# Patient Record
Sex: Female | Born: 1955 | Race: White | Hispanic: No | Marital: Single | Smoking: Former smoker
Health system: Southern US, Community
[De-identification: ages and names within clinical notes are randomized; demographics above are authoritative.]

## PROBLEM LIST (undated history)

## (undated) DIAGNOSIS — Z9889 Other specified postprocedural states: Secondary | ICD-10-CM

## (undated) DIAGNOSIS — J449 Chronic obstructive pulmonary disease, unspecified: Secondary | ICD-10-CM

## (undated) DIAGNOSIS — R112 Nausea with vomiting, unspecified: Secondary | ICD-10-CM

## (undated) DIAGNOSIS — E785 Hyperlipidemia, unspecified: Secondary | ICD-10-CM

## (undated) HISTORY — PX: TUBAL LIGATION: SHX77

## (undated) HISTORY — PX: COLONOSCOPY: SHX174

## (undated) HISTORY — PX: APPENDECTOMY: SHX54

---

## 2008-05-07 ENCOUNTER — Other Ambulatory Visit: Admission: RE | Admit: 2008-05-07 | Discharge: 2008-05-07 | Payer: Self-pay | Admitting: Obstetrics and Gynecology

## 2014-09-14 ENCOUNTER — Other Ambulatory Visit: Payer: Self-pay | Admitting: Physician Assistant

## 2014-09-14 DIAGNOSIS — R82998 Other abnormal findings in urine: Secondary | ICD-10-CM

## 2014-09-17 ENCOUNTER — Other Ambulatory Visit: Payer: Self-pay

## 2014-09-20 ENCOUNTER — Other Ambulatory Visit: Payer: Self-pay

## 2015-01-18 ENCOUNTER — Ambulatory Visit
Admission: RE | Admit: 2015-01-18 | Discharge: 2015-01-18 | Disposition: A | Discharge status: Home / Self Care | Payer: 59 | Source: Ambulatory Visit | Attending: Physician Assistant | Admitting: Physician Assistant

## 2015-01-18 ENCOUNTER — Other Ambulatory Visit: Payer: Self-pay | Admitting: Physician Assistant

## 2015-01-18 DIAGNOSIS — R52 Pain, unspecified: Secondary | ICD-10-CM

## 2015-01-18 DIAGNOSIS — R609 Edema, unspecified: Secondary | ICD-10-CM

## 2017-12-02 ENCOUNTER — Other Ambulatory Visit: Payer: Self-pay | Admitting: Physician Assistant

## 2017-12-02 ENCOUNTER — Ambulatory Visit
Admission: RE | Admit: 2017-12-02 | Discharge: 2017-12-02 | Disposition: A | Discharge status: Home / Self Care | Payer: BLUE CROSS/BLUE SHIELD | Source: Ambulatory Visit | Attending: Physician Assistant | Admitting: Physician Assistant

## 2017-12-02 DIAGNOSIS — J189 Pneumonia, unspecified organism: Secondary | ICD-10-CM

## 2017-12-03 ENCOUNTER — Other Ambulatory Visit: Payer: Self-pay | Admitting: Physician Assistant

## 2017-12-03 DIAGNOSIS — J189 Pneumonia, unspecified organism: Secondary | ICD-10-CM

## 2017-12-10 ENCOUNTER — Ambulatory Visit
Admission: RE | Admit: 2017-12-10 | Discharge: 2017-12-10 | Disposition: A | Discharge status: Home / Self Care | Payer: BLUE CROSS/BLUE SHIELD | Source: Ambulatory Visit | Attending: Physician Assistant | Admitting: Physician Assistant

## 2017-12-10 DIAGNOSIS — J189 Pneumonia, unspecified organism: Secondary | ICD-10-CM

## 2017-12-10 MED ORDER — IOPAMIDOL (ISOVUE-300) INJECTION 61%
75.0000 mL | Freq: Once | INTRAVENOUS | Status: AC | PRN
  Administered 2017-12-10: 75 mL via INTRAVENOUS

## 2019-04-04 ENCOUNTER — Emergency Department (HOSPITAL_COMMUNITY)
Admission: EM | Admit: 2019-04-04 | Discharge: 2019-04-04 | Disposition: A | Discharge status: Home / Self Care | Payer: 59 | Attending: Emergency Medicine | Admitting: Emergency Medicine

## 2019-04-04 ENCOUNTER — Other Ambulatory Visit: Payer: Self-pay

## 2019-04-04 ENCOUNTER — Encounter (HOSPITAL_COMMUNITY): Payer: Self-pay

## 2019-04-04 ENCOUNTER — Emergency Department (HOSPITAL_COMMUNITY): Payer: 59

## 2019-04-04 DIAGNOSIS — R112 Nausea with vomiting, unspecified: Secondary | ICD-10-CM | POA: Diagnosis not present

## 2019-04-04 DIAGNOSIS — Z87891 Personal history of nicotine dependence: Secondary | ICD-10-CM | POA: Insufficient documentation

## 2019-04-04 DIAGNOSIS — R1013 Epigastric pain: Secondary | ICD-10-CM | POA: Insufficient documentation

## 2019-04-04 DIAGNOSIS — J449 Chronic obstructive pulmonary disease, unspecified: Secondary | ICD-10-CM | POA: Diagnosis not present

## 2019-04-04 HISTORY — DX: Chronic obstructive pulmonary disease, unspecified: J44.9

## 2019-04-04 LAB — COMPREHENSIVE METABOLIC PANEL
ALT: 14 U/L (ref 0–44)
AST: 15 U/L (ref 15–41)
Albumin: 4.8 g/dL (ref 3.5–5.0)
Alkaline Phosphatase: 70 U/L (ref 38–126)
Anion gap: 9 (ref 5–15)
BUN: 17 mg/dL (ref 8–23)
CO2: 29 mmol/L (ref 22–32)
Calcium: 9.5 mg/dL (ref 8.9–10.3)
Chloride: 100 mmol/L (ref 98–111)
Creatinine, Ser: 0.63 mg/dL (ref 0.44–1.00)
GFR calc Af Amer: >60 mL/min (ref >60)
GFR calc non Af Amer: >60 mL/min (ref >60)
Glucose, Bld: 106 mg/dL — ABNORMAL HIGH (ref 70–99)
Potassium: 4.1 mmol/L (ref 3.5–5.1)
Sodium: 138 mmol/L (ref 135–145)
Total Bilirubin: 1 mg/dL (ref 0.3–1.2)
Total Protein: 7.8 g/dL (ref 6.5–8.1)

## 2019-04-04 LAB — CBC
HCT: 47.8 % — ABNORMAL HIGH (ref 36.0–46.0)
Hemoglobin: 15.4 g/dL — ABNORMAL HIGH (ref 12.0–15.0)
MCH: 29 pg (ref 26.0–34.0)
MCHC: 32.2 g/dL (ref 30.0–36.0)
MCV: 90 fL (ref 80.0–100.0)
Platelets: 299 10*3/uL (ref 150–400)
RBC: 5.31 MIL/uL — ABNORMAL HIGH (ref 3.87–5.11)
RDW: 12.6 % (ref 11.5–15.5)
WBC: 10.4 10*3/uL (ref 4.0–10.5)
nRBC: 0 % (ref 0.0–0.2)

## 2019-04-04 LAB — LIPASE, BLOOD: Lipase: 49 U/L (ref 11–51)

## 2019-04-04 MED ORDER — HYDROCODONE-ACETAMINOPHEN 5-325 MG PO TABS: 1.0000 | ORAL_TABLET | Freq: Four times a day (QID) | ORAL | 0 refills | Status: DC | PRN

## 2019-04-04 MED ORDER — OMEPRAZOLE 20 MG PO CPDR: 20.0000 mg | DELAYED_RELEASE_CAPSULE | Freq: Two times a day (BID) | ORAL | 0 refills | Status: DC

## 2019-04-04 MED ORDER — PANTOPRAZOLE SODIUM 40 MG IV SOLR
40.0000 mg | Freq: Once | INTRAVENOUS | Status: AC
  Administered 2019-04-04: 40 mg via INTRAVENOUS
  Filled 2019-04-04: qty 40

## 2019-04-04 MED ORDER — SODIUM CHLORIDE 0.9 % IV SOLN: INTRAVENOUS | Status: DC

## 2019-04-04 MED ORDER — ONDANSETRON 4 MG PO TBDP: 4.0000 mg | ORAL_TABLET | Freq: Three times a day (TID) | ORAL | 1 refills | Status: DC | PRN

## 2019-04-04 MED ORDER — ONDANSETRON HCL 4 MG/2ML IJ SOLN
4.0000 mg | Freq: Once | INTRAMUSCULAR | Status: AC
  Administered 2019-04-04: 4 mg via INTRAVENOUS
  Filled 2019-04-04: qty 2

## 2019-04-04 MED ORDER — IOHEXOL 300 MG/ML  SOLN
100.0000 mL | Freq: Once | INTRAMUSCULAR | Status: AC | PRN
  Administered 2019-04-04: 100 mL via INTRAVENOUS

## 2019-04-04 MED ORDER — HYDROMORPHONE HCL 1 MG/ML IJ SOLN
0.5000 mg | Freq: Once | INTRAMUSCULAR | Status: AC
  Administered 2019-04-04: 0.5 mg via INTRAVENOUS
  Filled 2019-04-04: qty 1

## 2019-04-04 MED ORDER — SODIUM CHLORIDE 0.9 % IV BOLUS
500.0000 mL | Freq: Once | INTRAVENOUS | Status: AC
  Administered 2019-04-04: 500 mL via INTRAVENOUS

## 2019-04-04 NOTE — ED Notes (Signed)
Patient and husband received d/c papers and prepack

## 2019-04-04 NOTE — Discharge Instructions (Addendum)
Take the Prilosec medication twice a day for the next 2 weeks.  Take hydrocodone as needed for pain.  Take Zofran as needed for nausea and vomiting.  Make an appointment to follow-up with gastroenterology Dr. Oneida Alar information provided above.  Return for any new or worse symptoms.  As we discussed CT scan showed some inflammation in the stomach.  Gallbladder was normal.

## 2019-04-04 NOTE — ED Triage Notes (Signed)
Pt has been having abdominal pain that her PCP is relating to her gallbladder. States pain is in the upper part of her abdomen. Had EKG done yesterday by PCP to rule out cardiac issue. Stated EKG was WDL.

## 2019-04-04 NOTE — ED Provider Notes (Signed)
[facility] EMERGENCY DEPARTMENT Provider Note   CSN: [id number] Arrival date & time: 04/04/19  1029     History   Chief Complaint Chief Complaint  Patient presents with  . Abdominal Pain    HPI Monica Spence is a 63 y.o. female.     Patient with a complaint of epigastric abdominal pain that is been constant since Wednesday.  Does radiate to the back.  Associated with nausea and some vomiting no vomiting of blood no fever no upper respiratory symptoms.  Patient saw primary care provider they thought maybe it was gallbladder related but no specific studies done.  Patient without any prior history of similar pain.     Past Medical History:  Diagnosis Date  . COPD (chronic obstructive pulmonary disease) (HCC)     There are no active problems to display for this patient.   Past Surgical History:  Procedure Laterality Date  . APPENDECTOMY       OB History   No obstetric history on file.      Home Medications    Prior to Admission medications   Medication Sig Start Date End Date Taking? Authorizing Provider  TRELEGY ELLIPTA 100-62.5-25 MCG/INH AEPB Take 1 puff by mouth daily. 10/27/18  Yes [provider]  VENTOLIN HFA 108 (90 Base) MCG/ACT inhaler Take 1-2 puffs by mouth daily. Inhale 1-2 puffs by mouth every 4 to 6 hours as needed for shortness of breath or wheezing 10/17/18  Yes [provider]  HYDROcodone-acetaminophen (NORCO/VICODIN) 5-325 MG tablet Take 1 tablet by mouth every 6 (six) hours as needed. 04/04/19   Fredia Sorrow, MD  omeprazole (PRILOSEC) 20 MG capsule Take 1 capsule (20 mg total) by mouth 2 (two) times daily before a meal for 14 days. 04/04/19 04/18/19  Fredia Sorrow, MD  ondansetron (ZOFRAN ODT) 4 MG disintegrating tablet Take 1 tablet (4 mg total) by mouth every 8 (eight) hours as needed. 04/04/19   Fredia Sorrow, MD    Family History No family history on file.  Social History Social History   Tobacco Use  . Smoking  status: Former Research scientist (life sciences)  . Smokeless tobacco: Never Used  Substance Use Topics  . Alcohol use: Yes    Comment: Occasional   . Drug use: Never     Allergies   Patient has no allergy information on record.   Review of Systems Review of Systems  Constitutional: Negative for chills and fever.  HENT: Negative for congestion, rhinorrhea and sore throat.   Eyes: Negative for visual disturbance.  Respiratory: Negative for cough and shortness of breath.   Cardiovascular: Negative for chest pain and leg swelling.  Gastrointestinal: Positive for abdominal pain, nausea and vomiting. Negative for diarrhea.  Genitourinary: Negative for dysuria.  Musculoskeletal: Positive for back pain. Negative for neck pain.  Skin: Negative for rash.  Neurological: Negative for dizziness, light-headedness and headaches.  Hematological: Does not bruise/bleed easily.  Psychiatric/Behavioral: Negative for confusion.     Physical Exam Updated Vital Signs BP (!) 158/86   Pulse 67   Temp 98.1 F (36.7 C) (Oral)   Resp 20   Ht 1.626 m (5\' 4" )   Wt 54.9 kg   SpO2 98%   BMI 20.77 kg/m   Physical Exam Vitals signs and nursing note reviewed.  Constitutional:      General: She is not in acute distress.    Appearance: Normal appearance. She is well-developed.  HENT:     Head: Normocephalic and atraumatic.  Eyes:  Extraocular Movements: Extraocular movements intact.     Conjunctiva/sclera: Conjunctivae normal.     Pupils: Pupils are equal, round, and reactive to light.  Neck:     Musculoskeletal: Normal range of motion and neck supple.  Cardiovascular:     Rate and Rhythm: Normal rate and regular rhythm.     Heart sounds: No murmur.  Pulmonary:     Effort: Pulmonary effort is normal. No respiratory distress.     Breath sounds: Normal breath sounds.  Abdominal:     General: There is no distension.     Palpations: Abdomen is soft.     Tenderness: There is no abdominal tenderness.   Musculoskeletal: Normal range of motion.        General: No swelling.  Skin:    General: Skin is warm and dry.  Neurological:     General: No focal deficit present.     Mental Status: She is alert and oriented to person, place, and time.      ED Treatments / Results  Labs (all labs ordered are listed, but only abnormal results are displayed) Labs Reviewed  COMPREHENSIVE METABOLIC PANEL - Abnormal; Notable for the following components:      Result Value   Glucose, Bld 106 (*)    All other components within normal limits  CBC - Abnormal; Notable for the following components:   RBC 5.31 (*)    Hemoglobin 15.4 (*)    HCT 47.8 (*)    All other components within normal limits  LIPASE, BLOOD    EKG EKG Interpretation  Date/Time:  Saturday April 04 2019 17:06:04 EDT Ventricular Rate:  81 PR Interval:    QRS Duration: 84 QT Interval:  394 QTC Calculation: 458 R Axis:   78 Text Interpretation:  Sinus rhythm Confirmed by Fredia Sorrow [phone]) on 04/04/2019 5:07:53 PM   Radiology Ct Abdomen Pelvis W Contrast  Result Date: 04/04/2019 CLINICAL DATA:  Upper abdominal pain EXAM: CT ABDOMEN AND PELVIS WITH CONTRAST TECHNIQUE: Multidetector CT imaging of the abdomen and pelvis was performed using the standard protocol following bolus administration of intravenous contrast. CONTRAST:  131mL OMNIPAQUE IOHEXOL 300 MG/ML  SOLN COMPARISON:  None. FINDINGS: Lower chest: Lung bases are clear. No effusions. Heart is normal size. Hepatobiliary: No focal hepatic abnormality. Gallbladder unremarkable. Pancreas: No focal abnormality or ductal dilatation. Spleen: No focal abnormality.  Normal size. Adrenals/Urinary Tract: No adrenal abnormality. No focal renal abnormality. No stones or hydronephrosis. Urinary bladder is unremarkable. Stomach/Bowel: There Is apparent wall thickening in the mid and distal stomach. While the stomach is not distended, the degree of wall thickening is concerning for  gastritis. No evidence of bowel obstruction. Small and large bowel unremarkable. Vascular/Lymphatic: Aortic atherosclerosis. No enlarged abdominal or pelvic lymph nodes. Reproductive: Uterus and adnexa unremarkable. No mass. prominent left ovarian vein and veins in the left adnexa likely reflects ovarian vein reflux. Other: No free fluid or free air. Musculoskeletal: No acute bony abnormality. IMPRESSION: Gastric wall thickening in the mid to distal stomach. While the stomach is not distended, this appears out of proportion and is concerning for gastritis. 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 This can lead to pelvic congestion syndrome. Electronically Signed   By: Rolm Baptise M.D.   On: 04/04/2019 19:40    Procedures Procedures (including critical care time)  Medications Ordered in ED Medications  0.9 %  sodium chloride infusion (has no administration in time range)  sodium chloride 0.9 % bolus 500 mL (0 mLs Intravenous Stopped 04/04/19  1833)  ondansetron (ZOFRAN) injection 4 mg (4 mg Intravenous Given 04/04/19 1659)  HYDROmorphone (DILAUDID) injection 0.5 mg (0.5 mg Intravenous Given 04/04/19 1700)  pantoprazole (PROTONIX) injection 40 mg (40 mg Intravenous Given 04/04/19 1700)  iohexol (OMNIPAQUE) 300 MG/ML solution 100 mL (100 mLs Intravenous Contrast Given 04/04/19 1920)     Initial Impression / Assessment and Plan / ED Course  I have reviewed the triage vital signs and the nursing notes.  Pertinent labs & imaging results that were available during my care of the patient were reviewed by me and considered in my medical decision making (see chart for details).      Work-up without any gallbladder abnormalities.  There is some inflammation and some swelling of the distal and mid stomach area.  May be a gastritis may be gastric ulcer.  We will go and treat with Prilosec.  For 2 weeks to have a referral and follow-up with GI medicine.  May need upper endoscopy.  Also treat with a short  course of hydrocodone for pain and Zofran for the nausea and the occasional vomiting.  Labs without any significant abnormalities.    Final Clinical Impressions(s) / ED Diagnoses   Final diagnoses:  Epigastric pain    ED Discharge Orders         Ordered    omeprazole (PRILOSEC) 20 MG capsule  2 times daily before meals     04/04/19 1957    ondansetron (ZOFRAN ODT) 4 MG disintegrating tablet  Every 8 hours PRN     04/04/19 1957    HYDROcodone-acetaminophen (NORCO/VICODIN) 5-325 MG tablet  Every 6 hours PRN     04/04/19 1959           Fredia Sorrow, MD 04/04/19 2015

## 2019-04-06 MED FILL — Hydrocodone-Acetaminophen Tab 5-325 MG: ORAL | Qty: 6 | Status: AC

## 2019-04-07 ENCOUNTER — Other Ambulatory Visit: Payer: Self-pay

## 2019-04-07 ENCOUNTER — Encounter (INDEPENDENT_AMBULATORY_CARE_PROVIDER_SITE_OTHER): Payer: Self-pay | Admitting: *Deleted

## 2019-04-07 ENCOUNTER — Ambulatory Visit (INDEPENDENT_AMBULATORY_CARE_PROVIDER_SITE_OTHER): Payer: 59 | Admitting: Nurse Practitioner

## 2019-04-07 VITALS — BP 149/90 | HR 80 | Temp 98.4°F | Ht 64.0 in | Wt 127.0 lb

## 2019-04-07 DIAGNOSIS — R935 Abnormal findings on diagnostic imaging of other abdominal regions, including retroperitoneum: Secondary | ICD-10-CM

## 2019-04-07 DIAGNOSIS — R1013 Epigastric pain: Secondary | ICD-10-CM

## 2019-04-07 MED ORDER — ONDANSETRON 4 MG PO TBDP: 4.0000 mg | ORAL_TABLET | Freq: Three times a day (TID) | ORAL | 1 refills | Status: DC | PRN

## 2019-04-07 MED ORDER — DICYCLOMINE HCL 10 MG PO CAPS: 10.0000 mg | ORAL_CAPSULE | Freq: Three times a day (TID) | ORAL | 0 refills | Status: DC | PRN

## 2019-04-07 NOTE — Patient Instructions (Signed)
1.  Schedule abdominal ultrasound to further evaluate the gallbladder and liver  2.  Schedule an upper endoscopy  3.  Continue omeprazole 20 mg 1 capsule twice daily  4.  Ondansetron 4 mg 1 tablet by mouth every 8 hours as needed for nausea  5.  Call our office if your nausea, vomiting or upper abdominal pain worsen.  Avoid fatty foods.  6 please call our office with the name of your past gastroenterologist in order to obtain a copy of your most recent colonoscopy  7.  Further follow-up to be determined after EGD completed

## 2019-04-07 NOTE — Progress Notes (Signed)
Subjective:    Patient ID: Monica Spence, female    DOB: Sep 12, 1955, 63 y.o.   MRN: [id number]  HPI  Monica Spence is a 63 year old female with a past medical history of COPD, she quit smoking a few months ago. Past appendectomy 40 years ago. She presented to [facility] emergency room 04/04/2019 with epigastric pain which radiated to her back. She had nausea and vomiting. No hematemesis. Labs in the ED showed a WBC 10.4.  Hemoglobin 15.4 and hematocrit 47.8.  Lipase 49.  LFTs were normal.  An abdominal/pelvic CAT scan with contrast identified gastric wall thickening in the mid to distal stomach suggestive of gastritis.  She was prescribed Prilosec 20 mg once daily and and she was advised to schedule GI consult. She continues to have epigastric pain which radiates across to her right upper back. Nausea comes and goes. No further vomiting. She is eating a bland diet. She has taken ibuprofen 200 mg 2 tabs twice this week for headaches. Typically avoids NSAIDs. She has taken Oxycodone 5mg  1/2 tab 3 to 4 doses since her ER visit. No alcohol use.  No fever, sweats or chills.  She is passing a normal bowel movement most days.  No rectal bleeding or melena.  She reported having a colonoscopy at the age of 77, she reported was normal.  She thinks she is due for another colonoscopy at the age of 59.  Past Medical History:  Diagnosis Date  . COPD (chronic obstructive pulmonary disease) ([place])    Past Surgical History:  Procedure Laterality Date  . APPENDECTOMY     Current Outpatient Medications on File Prior to Visit  Medication Sig Dispense Refill  . HYDROcodone-acetaminophen (NORCO/VICODIN) 5-325 MG tablet Take 1 tablet by mouth every 6 (six) hours as needed. 6 tablet 0  . omeprazole (PRILOSEC) 20 MG capsule Take 1 capsule (20 mg total) by mouth 2 (two) times daily before a meal for 14 days. 28 capsule 0  . TRELEGY ELLIPTA 100-62.5-25 MCG/INH AEPB Take 1 puff by mouth daily.    . VENTOLIN HFA 108  (90 Base) MCG/ACT inhaler Take 1-2 puffs by mouth daily. Inhale 1-2 puffs by mouth every 4 to 6 hours as needed for shortness of breath or wheezing     No current facility-administered medications on file prior to visit.    No Known Allergies  No family history of upper GI or colorectal cancer  Review of Systems see HPI, all other systems reviewed and are negative     Objective:   Physical Exam Blood pressure (!) 149/90, pulse 80, temperature 98.4 F (36.9 C), temperature source Oral, height 5\' 4"  (1.626 m), weight 127 lb (57.6 kg).  General: 63 year old female well-developed in no acute distress Eyes: Sclera nonicteric, conjunctiva pink Mouth: Dentition intact, several dental grafts Neck: Supple, no thyromegaly or lymphadenopathy Heaert: Regular rate and rhythm, no murmurs Lungs: Clear throughout Abdomen: Mild left upper quadrant tenderness without rebound or guarding, positive bowel sounds to all 4 quadrants, no HSM Rectal: Deferred Extremities: No edema Neuro: Alert and oriented x4, no focal deficits     Assessment & Plan:   46.  63 year old female with epigastric pain which radiates to her right upper back, nausea/vomiting with abdominal CT showing gastric wall thickening -Schedule EGD, benefits and risks discussed patient including risk with sedation, risk of perforation, bleeding and infection -Omeprazole 20 mg p.o. twice daily -abdominal sonogram to further evaluate the gallbladder  -Dicyclomine 10mg  one tab po tid PRN pain  2.  Nausea -Ondansetron 4 mg 1 tab p.o. every 8 hours PRN -Small more frequent meals recommended  3. Colon cancer screening -request copy of colonoscopy done at the age of 72

## 2019-04-13 ENCOUNTER — Ambulatory Visit (HOSPITAL_COMMUNITY)
Admission: RE | Admit: 2019-04-13 | Discharge: 2019-04-13 | Disposition: A | Discharge status: Home / Self Care | Payer: 59 | Source: Ambulatory Visit | Attending: Nurse Practitioner | Admitting: Nurse Practitioner

## 2019-04-13 ENCOUNTER — Other Ambulatory Visit: Payer: Self-pay

## 2019-04-13 DIAGNOSIS — R1013 Epigastric pain: Secondary | ICD-10-CM | POA: Diagnosis present

## 2019-04-14 ENCOUNTER — Other Ambulatory Visit (HOSPITAL_COMMUNITY)
Admission: RE | Admit: 2019-04-14 | Discharge: 2019-04-14 | Disposition: A | Discharge status: Home / Self Care | Payer: 59 | Source: Ambulatory Visit | Attending: Internal Medicine | Admitting: Internal Medicine

## 2019-04-15 ENCOUNTER — Telehealth (INDEPENDENT_AMBULATORY_CARE_PROVIDER_SITE_OTHER): Payer: Self-pay | Admitting: Nurse Practitioner

## 2019-04-15 ENCOUNTER — Encounter (INDEPENDENT_AMBULATORY_CARE_PROVIDER_SITE_OTHER): Payer: Self-pay | Admitting: *Deleted

## 2019-04-15 NOTE — Telephone Encounter (Signed)
Patient called for test results  -  Ph# [phone]

## 2019-04-16 NOTE — Telephone Encounter (Signed)
I called pt, discussed her abd sono was normal. She missed her EGD appt, she has rescheduled EGD

## 2019-05-15 ENCOUNTER — Other Ambulatory Visit (INDEPENDENT_AMBULATORY_CARE_PROVIDER_SITE_OTHER): Payer: Self-pay | Admitting: *Deleted

## 2019-05-18 ENCOUNTER — Other Ambulatory Visit (INDEPENDENT_AMBULATORY_CARE_PROVIDER_SITE_OTHER): Payer: Self-pay | Admitting: *Deleted

## 2019-05-25 ENCOUNTER — Other Ambulatory Visit: Payer: Self-pay

## 2019-05-25 ENCOUNTER — Other Ambulatory Visit (HOSPITAL_COMMUNITY)
Admission: RE | Admit: 2019-05-25 | Discharge: 2019-05-25 | Disposition: A | Discharge status: Home / Self Care | Payer: 59 | Source: Ambulatory Visit | Attending: Internal Medicine | Admitting: Internal Medicine

## 2019-05-26 ENCOUNTER — Other Ambulatory Visit: Payer: Self-pay

## 2019-05-26 ENCOUNTER — Other Ambulatory Visit (HOSPITAL_COMMUNITY)
Admission: RE | Admit: 2019-05-26 | Discharge: 2019-05-26 | Disposition: A | Discharge status: Home / Self Care | Payer: 59 | Source: Ambulatory Visit | Attending: Internal Medicine | Admitting: Internal Medicine

## 2019-05-26 DIAGNOSIS — Z01812 Encounter for preprocedural laboratory examination: Secondary | ICD-10-CM | POA: Insufficient documentation

## 2019-05-26 DIAGNOSIS — Z20828 Contact with and (suspected) exposure to other viral communicable diseases: Secondary | ICD-10-CM | POA: Insufficient documentation

## 2019-05-26 LAB — SARS CORONAVIRUS 2 (TAT 6-24 HRS): SARS Coronavirus 2: NEGATIVE

## 2019-05-27 ENCOUNTER — Encounter (HOSPITAL_COMMUNITY)
Admission: RE | Disposition: A | Discharge status: Home / Self Care | Payer: Self-pay | Source: Home / Self Care | Attending: Internal Medicine

## 2019-05-27 ENCOUNTER — Other Ambulatory Visit: Payer: Self-pay

## 2019-05-27 ENCOUNTER — Encounter (HOSPITAL_COMMUNITY): Payer: Self-pay | Admitting: *Deleted

## 2019-05-27 ENCOUNTER — Ambulatory Visit (HOSPITAL_COMMUNITY)
Admission: RE | Admit: 2019-05-27 | Discharge: 2019-05-27 | Disposition: A | Discharge status: Home / Self Care | Payer: 59 | Attending: Internal Medicine | Admitting: Internal Medicine

## 2019-05-27 DIAGNOSIS — R948 Abnormal results of function studies of other organs and systems: Secondary | ICD-10-CM | POA: Diagnosis not present

## 2019-05-27 DIAGNOSIS — K259 Gastric ulcer, unspecified as acute or chronic, without hemorrhage or perforation: Secondary | ICD-10-CM | POA: Diagnosis not present

## 2019-05-27 DIAGNOSIS — Z79899 Other long term (current) drug therapy: Secondary | ICD-10-CM | POA: Diagnosis not present

## 2019-05-27 DIAGNOSIS — K319 Disease of stomach and duodenum, unspecified: Secondary | ICD-10-CM | POA: Diagnosis not present

## 2019-05-27 DIAGNOSIS — K3189 Other diseases of stomach and duodenum: Secondary | ICD-10-CM | POA: Diagnosis not present

## 2019-05-27 DIAGNOSIS — K2950 Unspecified chronic gastritis without bleeding: Secondary | ICD-10-CM | POA: Diagnosis not present

## 2019-05-27 DIAGNOSIS — K269 Duodenal ulcer, unspecified as acute or chronic, without hemorrhage or perforation: Secondary | ICD-10-CM | POA: Diagnosis not present

## 2019-05-27 DIAGNOSIS — Z87891 Personal history of nicotine dependence: Secondary | ICD-10-CM | POA: Diagnosis not present

## 2019-05-27 DIAGNOSIS — J449 Chronic obstructive pulmonary disease, unspecified: Secondary | ICD-10-CM | POA: Diagnosis not present

## 2019-05-27 DIAGNOSIS — K228 Other specified diseases of esophagus: Secondary | ICD-10-CM

## 2019-05-27 DIAGNOSIS — R933 Abnormal findings on diagnostic imaging of other parts of digestive tract: Secondary | ICD-10-CM | POA: Diagnosis not present

## 2019-05-27 DIAGNOSIS — Z7951 Long term (current) use of inhaled steroids: Secondary | ICD-10-CM | POA: Diagnosis not present

## 2019-05-27 DIAGNOSIS — R1013 Epigastric pain: Secondary | ICD-10-CM | POA: Insufficient documentation

## 2019-05-27 DIAGNOSIS — B9681 Helicobacter pylori [H. pylori] as the cause of diseases classified elsewhere: Secondary | ICD-10-CM | POA: Diagnosis not present

## 2019-05-27 HISTORY — DX: Other specified postprocedural states: R11.2

## 2019-05-27 HISTORY — DX: Nausea with vomiting, unspecified: Z98.890

## 2019-05-27 HISTORY — PX: BIOPSY: SHX5522

## 2019-05-27 HISTORY — PX: ESOPHAGOGASTRODUODENOSCOPY: SHX5428

## 2019-05-27 SURGERY — EGD (ESOPHAGOGASTRODUODENOSCOPY)
Anesthesia: Moderate Sedation

## 2019-05-27 MED ORDER — LIDOCAINE VISCOUS HCL 2 % MT SOLN
OROMUCOSAL | Status: DC | PRN
  Administered 2019-05-27: 1 via OROMUCOSAL

## 2019-05-27 MED ORDER — MEPERIDINE HCL 50 MG/ML IJ SOLN
INTRAMUSCULAR | Status: AC
  Filled 2019-05-27: qty 1

## 2019-05-27 MED ORDER — MIDAZOLAM HCL 5 MG/5ML IJ SOLN
INTRAMUSCULAR | Status: AC
  Filled 2019-05-27: qty 10

## 2019-05-27 MED ORDER — MEPERIDINE HCL 50 MG/ML IJ SOLN
INTRAMUSCULAR | Status: DC | PRN
  Administered 2019-05-27 (×2): 25 mg

## 2019-05-27 MED ORDER — LIDOCAINE VISCOUS HCL 2 % MT SOLN
OROMUCOSAL | Status: AC
  Filled 2019-05-27: qty 15

## 2019-05-27 MED ORDER — MIDAZOLAM HCL 5 MG/5ML IJ SOLN
INTRAMUSCULAR | Status: DC | PRN
  Administered 2019-05-27 (×2): 2 mg via INTRAVENOUS
  Administered 2019-05-27: 1 mg via INTRAVENOUS

## 2019-05-27 MED ORDER — PANTOPRAZOLE SODIUM 40 MG PO TBEC: 40.0000 mg | DELAYED_RELEASE_TABLET | Freq: Every day | ORAL | 5 refills | Status: DC

## 2019-05-27 MED ORDER — SODIUM CHLORIDE 0.9 % IV SOLN
INTRAVENOUS | Status: DC
  Administered 2019-05-27: 1000 mL via INTRAVENOUS

## 2019-05-27 NOTE — Op Note (Signed)
[facility] Patient Name: Monica Spence Procedure Date: 05/27/2019 12:47 PM MRN: [id number] Date of Birth: [birth year] Attending MD: Hildred Laser , MD CSN: [id number] Age: 63 Admit Type: Outpatient Procedure:                Upper GI endoscopy Indications:              Epigastric abdominal pain, Abnormal CT of the GI                            tract Providers:                Hildred Laser, MD, Janeece Riggers, RN, Aram Candela Referring MD:             Delphina Cahill, MD Medicines:                Lidocaine spray, Meperidine 50 mg IV, Midazolam 5                            mg IV Complications:            No immediate complications. Estimated Blood Loss:     Estimated blood loss was minimal. Procedure:                Pre-Anesthesia Assessment:                           - Prior to the procedure, a History and Physical                            was performed, and patient medications and                            allergies were reviewed. The patient's tolerance of                            previous anesthesia was also reviewed. The risks                            and benefits of the procedure and the sedation                            options and risks were discussed with the patient.                            All questions were answered, and informed consent                            was obtained. Prior Anticoagulants: The patient has                            taken no previous anticoagulant or antiplatelet                            agents. ASA Grade Assessment: II - A patient with  mild systemic disease. After reviewing the risks                            and benefits, the patient was deemed in                            satisfactory condition to undergo the procedure.                           After obtaining informed consent, the endoscope was                            passed under direct vision. Throughout the                            procedure, the  patient's blood pressure, pulse, and                            oxygen saturations were monitored continuously. The                            [id number] scope was introduced through the                            mouth, and advanced to the second part of duodenum.                            The upper GI endoscopy was accomplished without                            difficulty. The patient tolerated the procedure                            well. Scope In: 1:38:10 PM Scope Out: 1:50:39 PM Total Procedure Duration: 0 hours 12 minutes 29 seconds  Findings:      The examined esophagus was normal.      The Z-line was irregular and was found 36 cm from the incisors. Biopsies       were taken with a cold forceps for histology. The pathology specimen was       placed into Bottle Number 2.      A few, non-bleeding erosions were found in the gastric antrum. There       were no stigmata of recent bleeding.      A medium scar was found at the incisura. This was biopsied with a cold       forceps for histology. The pathology specimen was placed into Bottle       Number 1.      The exam of the stomach was otherwise normal.      A few erosions without bleeding were found in the duodenal bulb.      The second portion of the duodenum was normal. Impression:               - Normal esophagus.                           -  Z-line irregular, 36 cm from the incisors.                            Biopsied.                           - Non-bleeding erosive gastropathy.                           - Fresh scar at incisura due to healed ulcer.                            Biopsied.                           - Duodenal erosions without bleeding.                           - Normal second portion of the duodenum. Moderate Sedation:      Moderate (conscious) sedation was administered by the endoscopy nurse       and supervised by the endoscopist. The following parameters were       monitored: oxygen saturation,  heart rate, blood pressure, CO2       capnography and response to care. Total physician intraservice time was       18 minutes. Recommendation:           - Patient has a contact number available for                            emergencies. The signs and symptoms of potential                            delayed complications were discussed with the                            patient. Return to normal activities tomorrow.                            Written discharge instructions were provided to the                            patient.                           - Resume previous diet today.                           - Continue present medications.                           - No aspirin, ibuprofen, naproxen, or other                            non-steroidal anti-inflammatory drugs for 1 day.                           - Pantoprazole 40 mg  po qam.                           - Await pathology results. Procedure Code(s):        --- Professional ---                           [phone], Esophagogastroduodenoscopy, flexible,                            transoral; with biopsy, single or multiple                           G0500, Moderate sedation services provided by the                            same physician or other qualified health care                            professional performing a gastrointestinal                            endoscopic service that sedation supports,                            requiring the presence of an independent trained                            observer to assist in the monitoring of the                            patient's level of consciousness and physiological                            status; initial 15 minutes of intra-service time;                            patient age 11 years or older (additional time may                            be reported with [phone], as appropriate) Diagnosis Code(s):        --- Professional ---                           K22.8, Other  specified diseases of esophagus                           K31.89, Other diseases of stomach and duodenum                           K26.9, Duodenal ulcer, unspecified as acute or                            chronic, without hemorrhage or perforation  R10.13, Epigastric pain                           R93.3, Abnormal findings on diagnostic imaging of                            other parts of digestive tract CPT copyright 2019 [employer]. All rights reserved. The codes documented in this report are preliminary and upon coder review may  be revised to meet current compliance requirements. Hildred Laser, MD Hildred Laser, MD 05/27/2019 2:03:06 PM This report has been signed electronically. Number of Addenda: 0

## 2019-05-27 NOTE — H&P (Signed)
Monica Spence is an 63 y.o. female.   Chief Complaint: Patient is here for esophagogastroduodenoscopy. HPI: Patient is 63 year old Caucasian female who presents with about a week history of epigastric pain radiating into her back.  She states pain started more or less suddenly and few days after onset of his pain she came to emergency room.  CBC prehensile chemistry panel and serum lipase were normal.  Abdominopelvic CT revealed wall thickening to distal stomach.  She was treated with omeprazole 20 mg twice daily.  She says she did not see much benefit and stopped it about 3 weeks ago.  Patient was seen in the office few weeks ago.  Abdominal ultrasound was obtained and was negative for cholelithiasis.  She says she is some better.  She is watching her diet.  When she has pain she just goes in rest for a while and she feels better.  She usually vomits bile.  She denies hematemesis melena or rectal bleeding.  She says her weight has been up and down by few pounds but no net weight loss or weight gain.  She does not take NSAIDs.  She used to smoke cigarettes.  She quit 3 to 4 weeks ago.  She does not drink alcohol.  Past Medical History:  Diagnosis Date  . COPD (chronic obstructive pulmonary disease) ([place])   . PONV (postoperative nausea and vomiting)     Past Surgical History:  Procedure Laterality Date  . APPENDECTOMY    . COLONOSCOPY    . TUBAL LIGATION      History reviewed. No pertinent family history. Social History:  reports that she has quit smoking. She has never used smokeless tobacco. She reports current alcohol use. She reports that she does not use drugs.  Allergies: No Known Allergies  Medications Prior to Admission  Medication Sig Dispense Refill  . omeprazole (PRILOSEC) 20 MG capsule Take 1 capsule (20 mg total) by mouth 2 (two) times daily before a meal for 14 days. 28 capsule 0  . TRELEGY ELLIPTA 100-62.5-25 MCG/INH AEPB Take 1 puff by mouth daily.    . VENTOLIN HFA 108  (90 Base) MCG/ACT inhaler Take 1-2 puffs by mouth daily. Inhale 1-2 puffs by mouth every 4 to 6 hours as needed for shortness of breath or wheezing    . alum & mag hydroxide-simeth (MAALOX/MYLANTA) 200-200-20 MG/5ML suspension Take 30 mLs by mouth every 6 (six) hours as needed for indigestion or heartburn.    . dicyclomine (BENTYL) 10 MG capsule Take 1 capsule (10 mg total) by mouth 3 (three) times daily as needed for spasms (for abdominal pain). 30 capsule 0  . HYDROcodone-acetaminophen (NORCO/VICODIN) 5-325 MG tablet Take 1 tablet by mouth every 6 (six) hours as needed. (Patient taking differently: Take 1 tablet by mouth every 6 (six) hours as needed for moderate pain. ) 6 tablet 0  . ondansetron (ZOFRAN ODT) 4 MG disintegrating tablet Take 1 tablet (4 mg total) by mouth every 8 (eight) hours as needed. 10 tablet 1    Results for orders placed or performed during the hospital encounter of 05/26/19 (from the past 48 hour(s))  SARS CORONAVIRUS 2 (TAT 6-24 HRS) Nasopharyngeal Nasopharyngeal Swab     Status: None   Collection Time: 05/26/19  7:02 AM   Specimen: Nasopharyngeal Swab  Result Value Ref Range   SARS Coronavirus 2 NEGATIVE NEGATIVE    Comment: (NOTE) SARS-CoV-2 target nucleic acids are NOT DETECTED. The SARS-CoV-2 RNA is generally detectable in upper and lower respiratory  specimens during the acute phase of infection. Negative results do not preclude SARS-CoV-2 infection, do not rule out co-infections with other pathogens, and should not be used as the sole basis for treatment or other patient management decisions. Negative results must be combined with clinical observations, patient history, and epidemiological information. The expected result is Negative. Fact Sheet for Patients: SugarRoll.be Fact Sheet for Healthcare Providers: https://www.woods-mathews.com/ This test is not yet approved or cleared by the [place] FDA and  has been  authorized for detection and/or diagnosis of SARS-CoV-2 by FDA under an Emergency Use Authorization (EUA). This EUA will remain  in effect (meaning this test can be used) for the duration of the COVID-19 declaration under Section 56 4(b)(1) of the Act, 21 U.S.C. section 360bbb-3(b)(1), unless the authorization is terminated or revoked sooner. Performed at [facility], [place] [address] [place], [place] [address]    No results found.  ROS  Blood pressure 132/78, pulse 65, temperature (!) 97.3 F (36.3 C), temperature source Axillary, resp. rate 11, height 5\' 4"  (1.626 m), weight 59 kg, SpO2 100 %. Physical Exam  Constitutional: She appears well-developed and well-nourished.  HENT:  Mouth/Throat: Oropharynx is clear and moist.  Eyes: Conjunctivae are normal. No scleral icterus.  Neck: No thyromegaly present.  Cardiovascular: Normal rate, regular rhythm and normal heart sounds.  No murmur heard. Respiratory: Effort normal and breath sounds normal.  GI:  Abdomen is symmetrical.  She has a horizontal appendectomy scar.  Abdomen is soft with mild midepigastric tenderness.  No organomegaly or masses.  Musculoskeletal:        General: No edema.  Lymphadenopathy:    She has no cervical adenopathy.  Neurological: She is alert.  Skin: Skin is warm and dry.     Assessment/Plan Epigastric pain nausea and vomiting. Abnormal CT in reference to stomach. Diagnostic esophagogastroduodenoscopy.  Hildred Laser, MD 05/27/2019, 1:28 PM

## 2019-05-27 NOTE — Discharge Instructions (Signed)
No aspirin or NSAIDs. Resume usual medications as before. Pantoprazole 40 mg by mouth 30 minutes before breakfast daily. Resume usual diet. No driving for 24 hours. Physician will call with biopsy results.   Upper Endoscopy, Adult, Care After This sheet gives you information about how to care for yourself after your procedure. Your health care provider may also give you more specific instructions. If you have problems or questions, contact your health care provider. What can I expect after the procedure? After the procedure, it is common to have:  A sore throat.  Mild stomach pain or discomfort.  Bloating.  Nausea. Follow these instructions at home:   Follow instructions from your health care provider about what to eat or drink after your procedure.  Return to your normal activities as told by your health care provider. Ask your health care provider what activities are safe for you.  Take over-the-counter and prescription medicines only as told by your health care provider.  Do not drive for 24 hours if you were given a sedative during your procedure.  Keep all follow-up visits as told by your health care provider. This is important. Contact a health care provider if you have:  A sore throat that lasts longer than one day.  Trouble swallowing. Get help right away if:  You vomit blood or your vomit looks like coffee grounds.  You have: ? A fever. ? Bloody, black, or tarry stools. ? A severe sore throat or you cannot swallow. ? Difficulty breathing. ? Severe pain in your chest or abdomen. Summary  After the procedure, it is common to have a sore throat, mild stomach discomfort, bloating, and nausea.  Do not drive for 24 hours if you were given a sedative during the procedure.  Follow instructions from your health care provider about what to eat or drink after your procedure.  Return to your normal activities as told by your health care provider. This information  is not intended to replace advice given to you by your health care provider. Make sure you discuss any questions you have with your health care provider. Document Released: 01/29/2012 Document Revised: 01/21/2018 Document Reviewed: 12/30/2017 Elsevier Patient Education  2020 [employer].

## 2019-05-29 LAB — SURGICAL PATHOLOGY

## 2019-06-01 ENCOUNTER — Other Ambulatory Visit (INDEPENDENT_AMBULATORY_CARE_PROVIDER_SITE_OTHER): Payer: Self-pay | Admitting: Internal Medicine

## 2019-06-01 MED ORDER — PYLERA 140-125-125 MG PO CAPS: 3.0000 | ORAL_CAPSULE | Freq: Three times a day (TID) | ORAL | 0 refills | Status: DC

## 2019-06-02 ENCOUNTER — Other Ambulatory Visit (INDEPENDENT_AMBULATORY_CARE_PROVIDER_SITE_OTHER): Payer: Self-pay | Admitting: *Deleted

## 2019-06-02 ENCOUNTER — Telehealth (INDEPENDENT_AMBULATORY_CARE_PROVIDER_SITE_OTHER): Payer: Self-pay | Admitting: *Deleted

## 2019-06-02 DIAGNOSIS — K2970 Gastritis, unspecified, without bleeding: Secondary | ICD-10-CM

## 2019-06-02 DIAGNOSIS — B9681 Helicobacter pylori [H. pylori] as the cause of diseases classified elsewhere: Secondary | ICD-10-CM

## 2019-06-02 NOTE — Telephone Encounter (Signed)
Patient's Insurance would not cover the Pylera. Per Dr.Rehman the patient may take Tetracycline 500 mg - Take 1 by mouth three times daily for 10 days. Flagyl 500 mg - take 1 by mouth three times daily for 10 days. Pepto Bismuth Liquid - take 1 tablespoon by mouth three times daily for 10 days.   This was called to the patient's pharmacy - [facility] in [place]- it was left on the pharmacist voicemail.  Patient was called and a detailed voicemail was left on her voicemail.

## 2019-06-03 ENCOUNTER — Encounter (HOSPITAL_COMMUNITY): Payer: Self-pay | Admitting: Internal Medicine

## 2019-07-06 ENCOUNTER — Other Ambulatory Visit (INDEPENDENT_AMBULATORY_CARE_PROVIDER_SITE_OTHER): Payer: Self-pay | Admitting: *Deleted

## 2019-07-06 DIAGNOSIS — B9681 Helicobacter pylori [H. pylori] as the cause of diseases classified elsewhere: Secondary | ICD-10-CM

## 2019-07-06 DIAGNOSIS — K2970 Gastritis, unspecified, without bleeding: Secondary | ICD-10-CM

## 2019-10-07 ENCOUNTER — Other Ambulatory Visit: Payer: Self-pay

## 2020-03-02 ENCOUNTER — Ambulatory Visit (INDEPENDENT_AMBULATORY_CARE_PROVIDER_SITE_OTHER): Payer: 59 | Admitting: Gastroenterology

## 2020-05-14 IMAGING — CT CT ABDOMEN AND PELVIS WITH CONTRAST
2 of 5 series · 16 of 46 positions shown, 18 images · IV contrast (Isovue)
Comparison: None.

CLINICAL DATA: Upper abdominal pain

EXAM:
CT ABDOMEN AND PELVIS WITH CONTRAST
TECHNIQUE: Multidetector CT imaging of the abdomen and pelvis was performed
using the standard protocol following bolus administration of
intravenous contrast.
CONTRAST:  100mL OMNIPAQUE IOHEXOL 300 MG/ML  SOLN

[Series 2: axial st · axial · 0.64mm/px · z∈[-407,-57]mm · 13 of 82 slices shown, 15 images]
[im 6/82  soft-tissue]
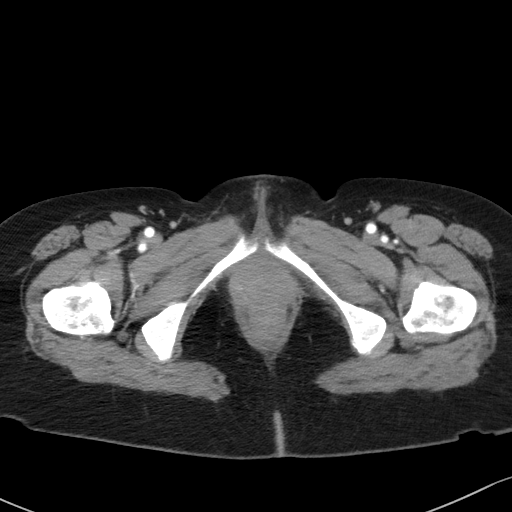
[im 6/82  bone]
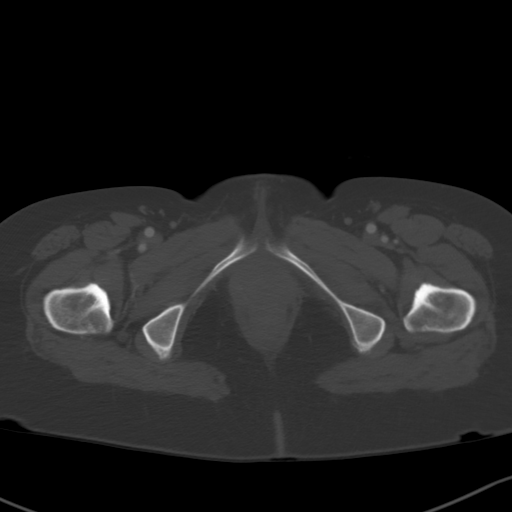
[im 11/82  soft-tissue]
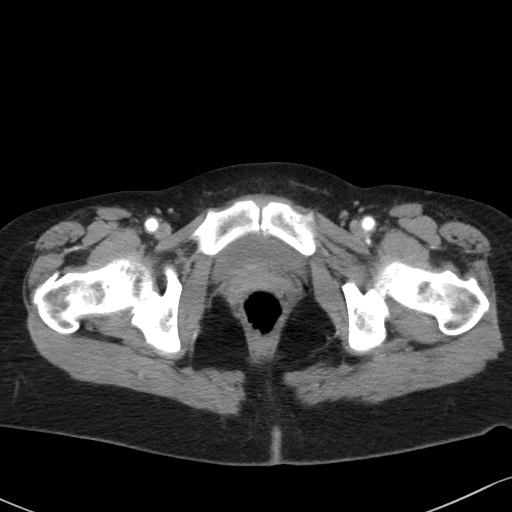
[im 17/82  soft-tissue]
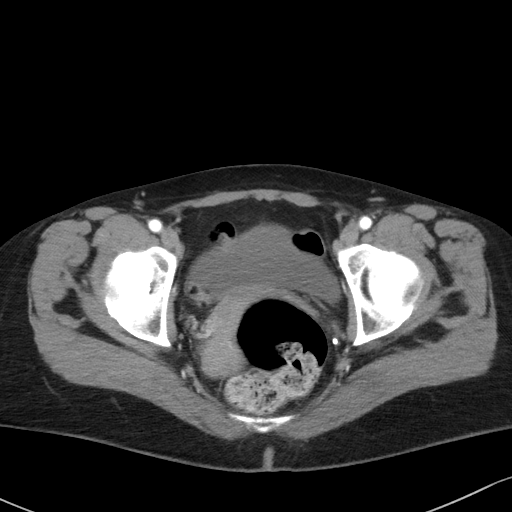
[im 22/82  soft-tissue]
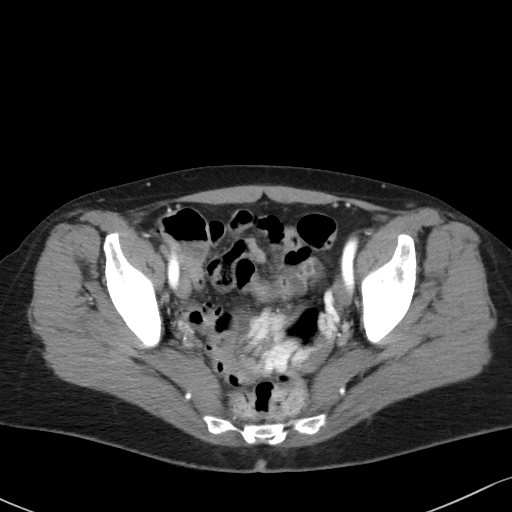
[im 28/82  soft-tissue]
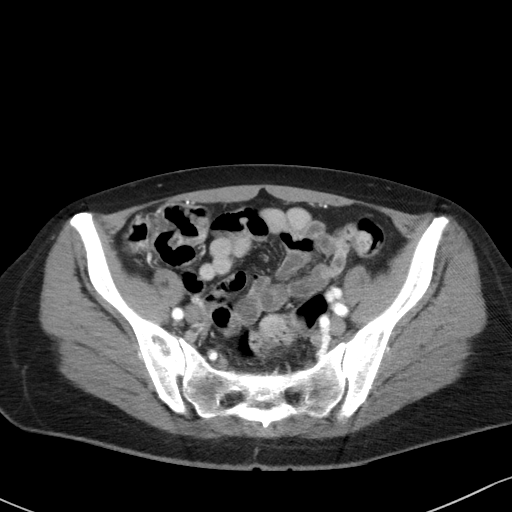
[im 33/82  soft-tissue]
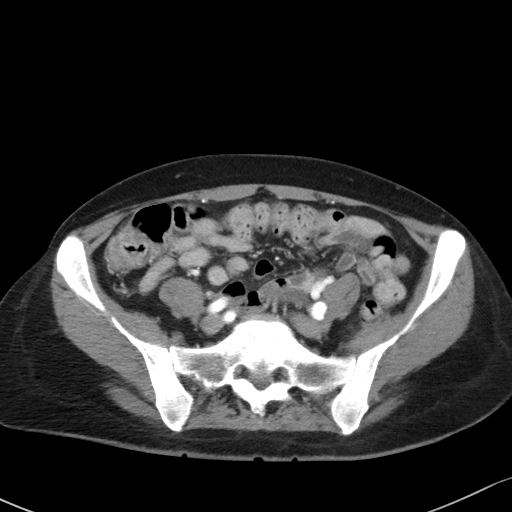
[im 44/82  soft-tissue]
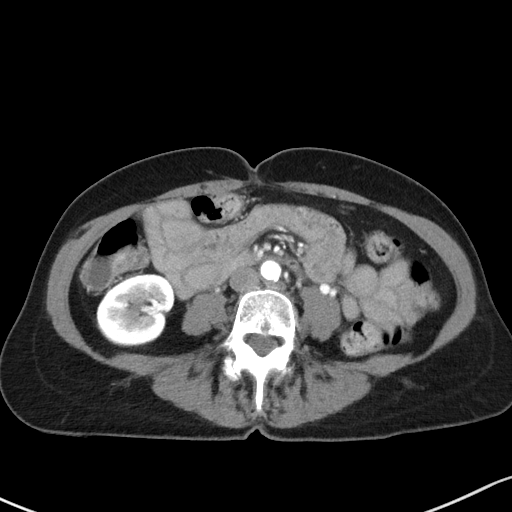
[im 49/82  soft-tissue]
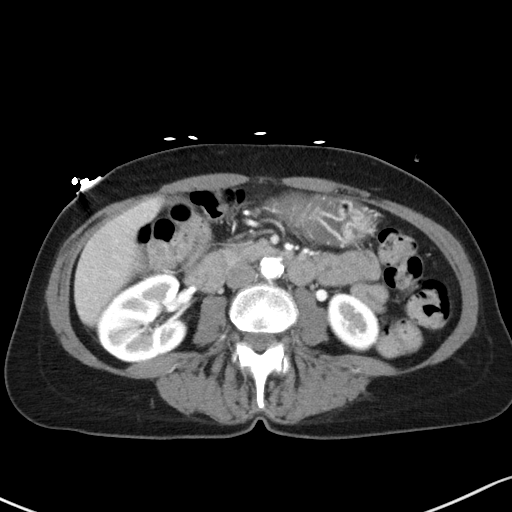
[im 55/82  soft-tissue]
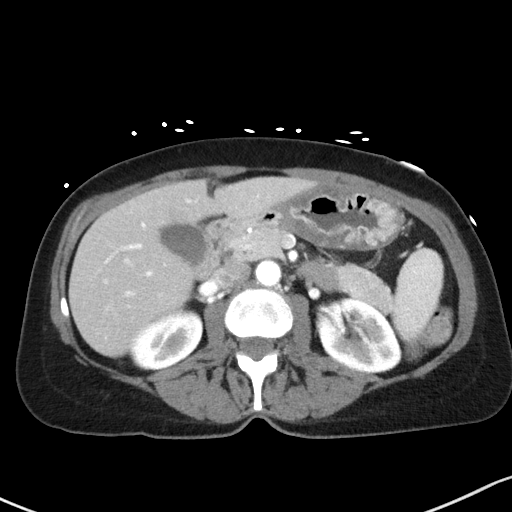
[im 55/82  bone]
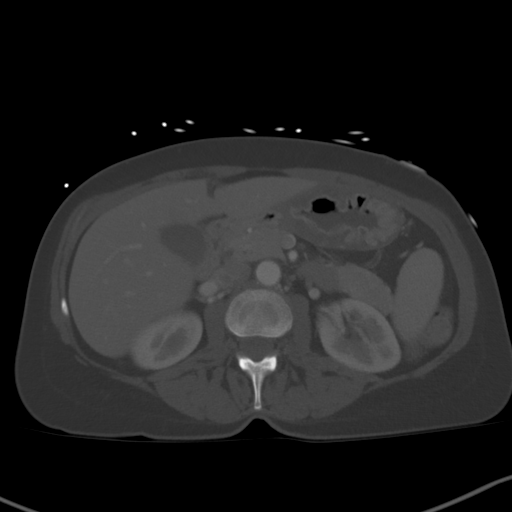
[im 60/82  soft-tissue]
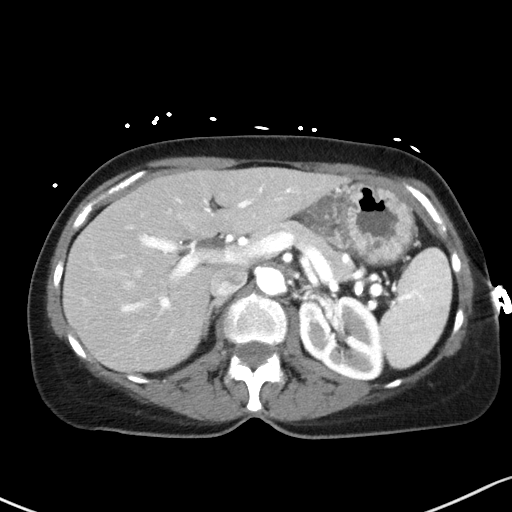
[im 65/82  soft-tissue]
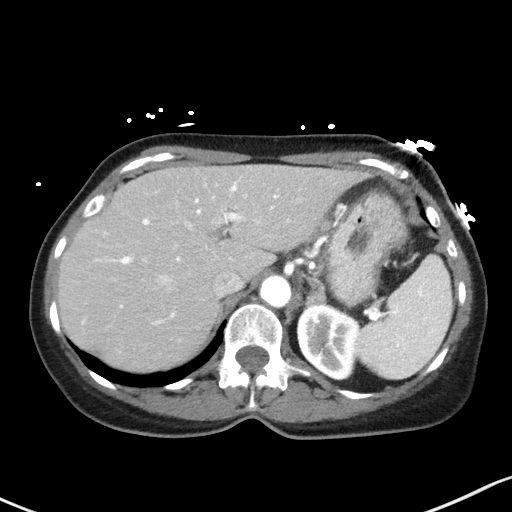
[im 71/82  soft-tissue]
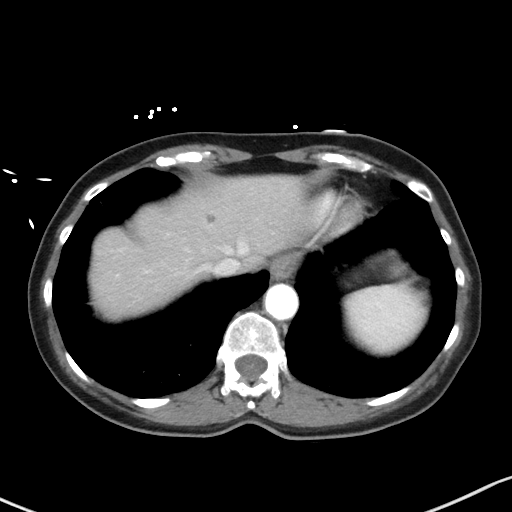
[im 76/82  soft-tissue]
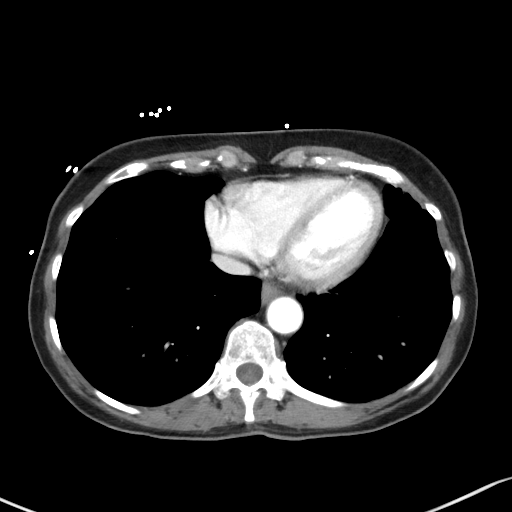

[Series 5: coronal st · coronal · 0.62mm/px · 3 of 67 slices shown]
[im 23/67  soft-tissue]
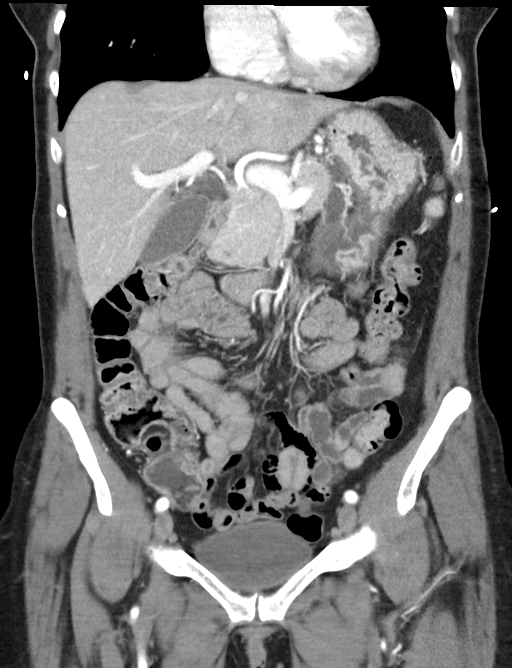
[im 30/67  soft-tissue]
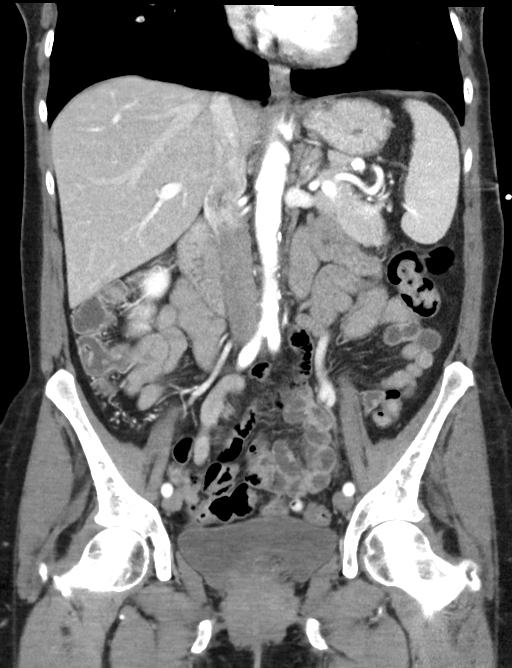
[im 37/67  soft-tissue]
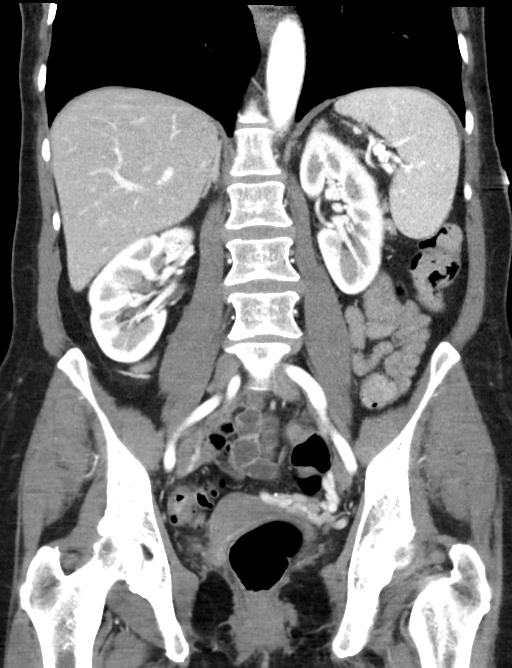

[16 of 46 positions shown; findings below may reference images not displayed]

FINDINGS: Lower chest: Lung bases are clear. No effusions. Heart is normal
size.

Hepatobiliary: No focal hepatic abnormality. Gallbladder
unremarkable.

Pancreas: No focal abnormality or ductal dilatation.

Spleen: No focal abnormality.  Normal size.

Adrenals/Urinary Tract: No adrenal abnormality. No focal renal
abnormality. No stones or hydronephrosis. Urinary bladder is
unremarkable.

Stomach/Bowel: There Is apparent wall thickening in the mid and
distal stomach. While the stomach is not distended, the degree of
wall thickening is concerning for gastritis. No evidence of bowel
obstruction. Small and large bowel unremarkable.

Vascular/Lymphatic: Aortic atherosclerosis. No enlarged abdominal or
pelvic lymph nodes.

Reproductive: Uterus and adnexa unremarkable. No mass. prominent
left ovarian vein and veins in the left adnexa likely reflects
ovarian vein reflux.

Other: No free fluid or free air.

Musculoskeletal: No acute bony abnormality.
IMPRESSION: Gastric wall thickening in the mid to distal stomach. While the
stomach is not distended, this appears out of proportion and is
concerning for gastritis.

Probable left ovarian/gonadal vein reflux. This can lead to pelvic
congestion syndrome.

## 2020-05-23 IMAGING — US ULTRASOUND ABDOMEN COMPLETE
1 series · 14 of 25 positions shown · non-contrast
Comparison: CT scan of April 04, 2019.

CLINICAL DATA: Epigastric abdominal pain.

EXAM:
ABDOMEN ULTRASOUND COMPLETE

[Series 1: ultrasound abdomen complete · 0.19mm/px · 14 of 90 slices shown]
[im 1/90]
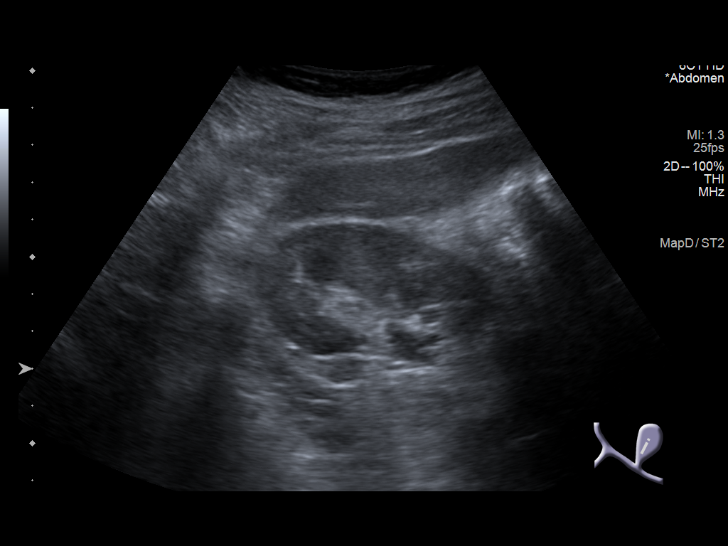
[im 8/90]
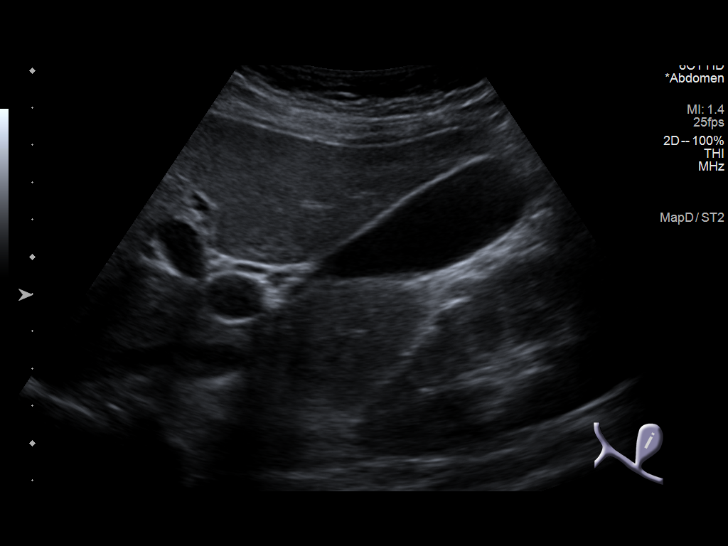
[im 15/90]
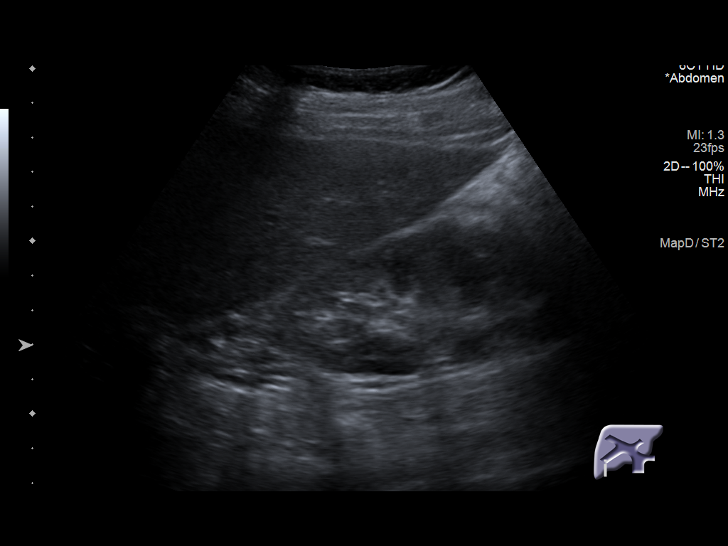
[im 23/90]
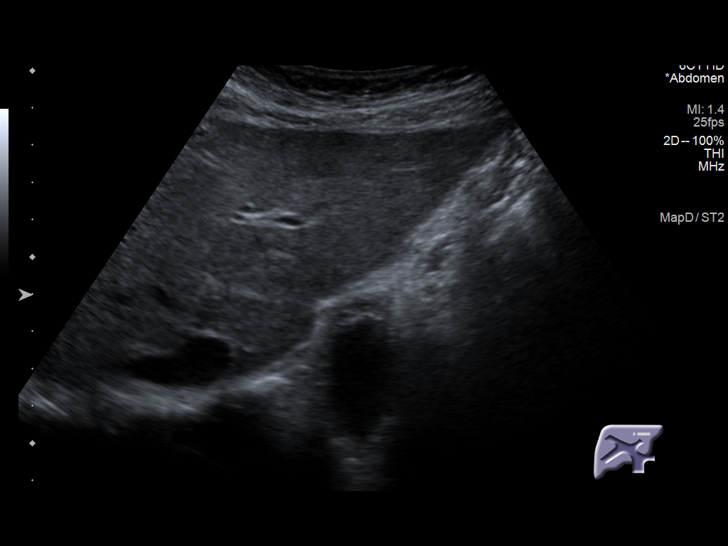
[im 30/90]
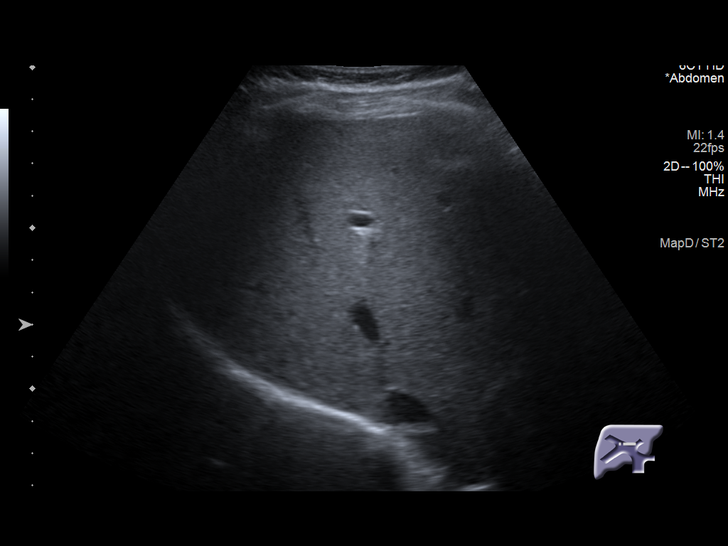
[im 34/90]
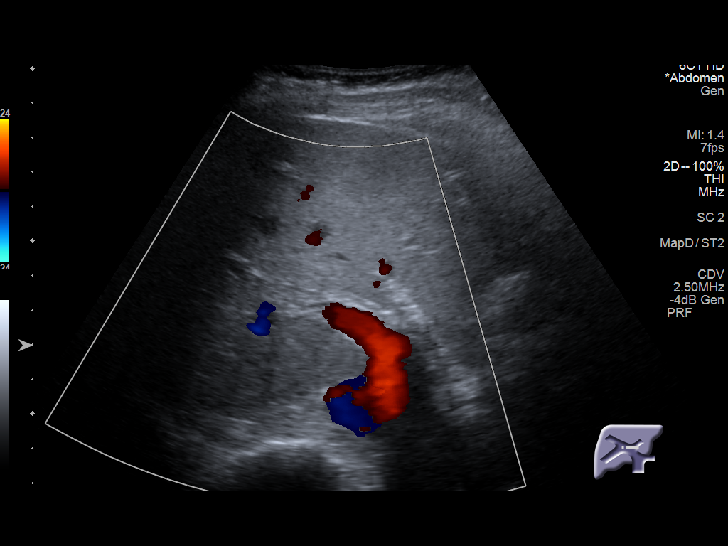
[im 41/90]
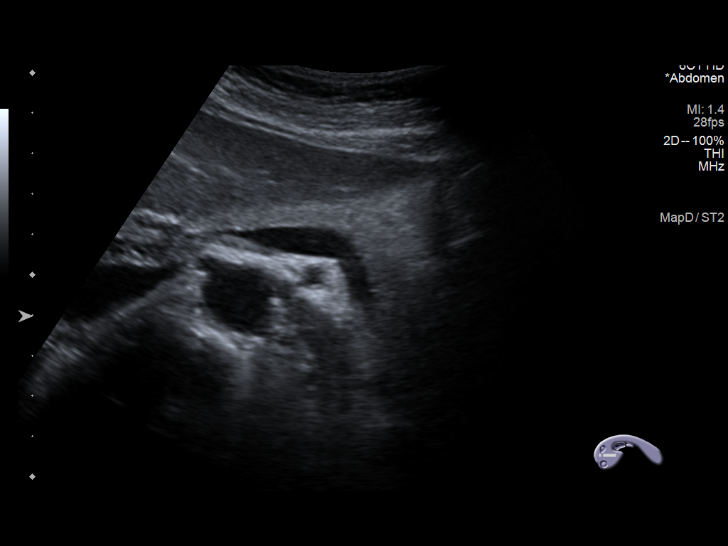
[im 49/90]
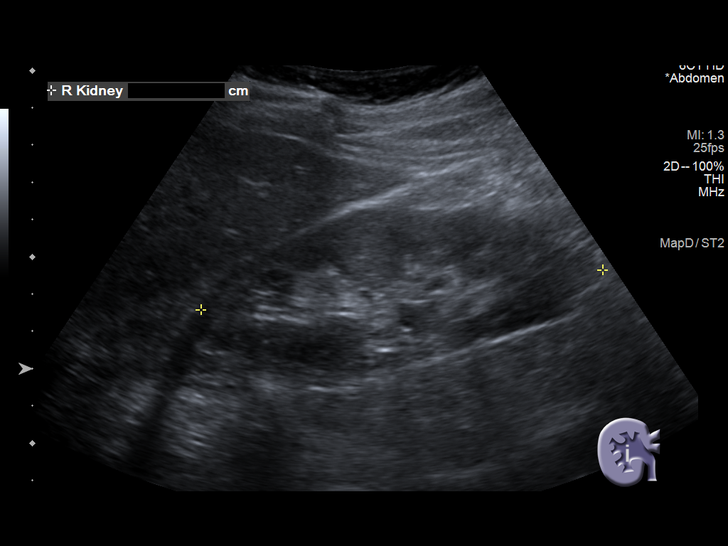
[im 56/90]
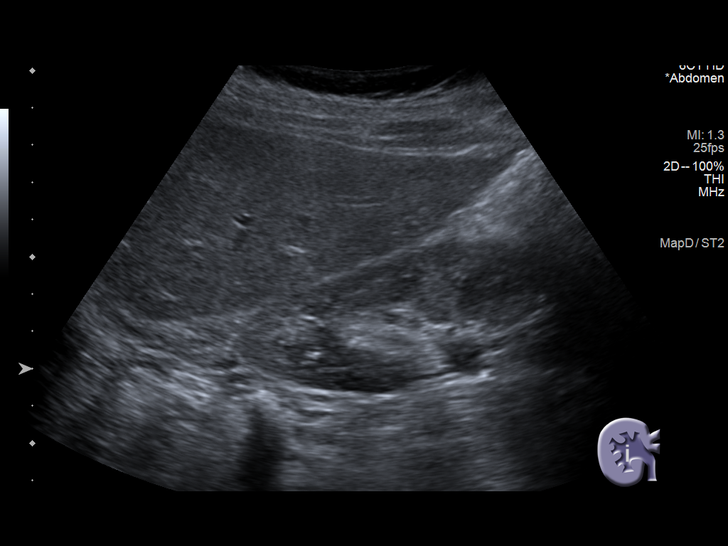
[im 60/90]
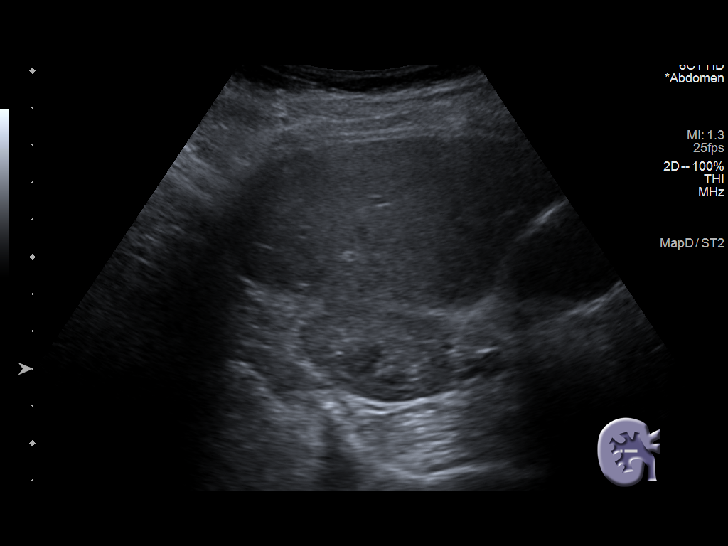
[im 67/90]
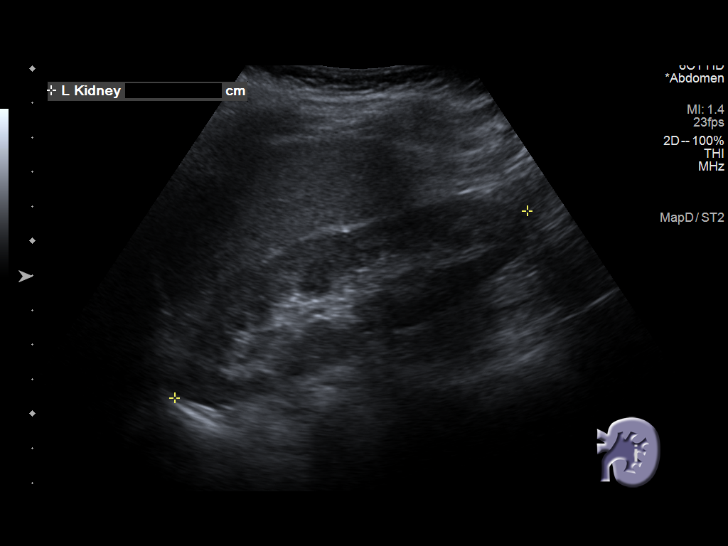
[im 75/90]
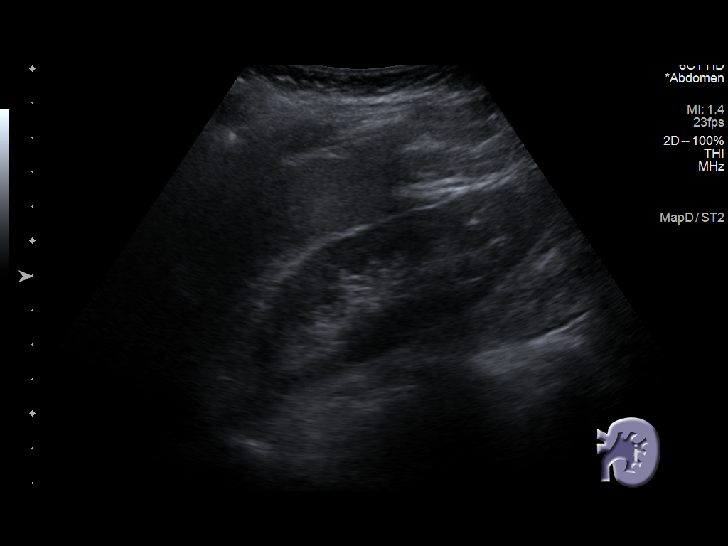
[im 82/90]
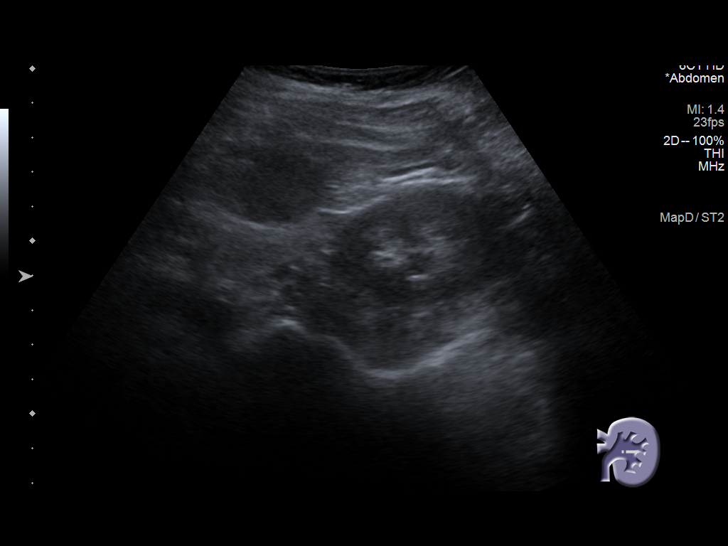
[im 90/90]
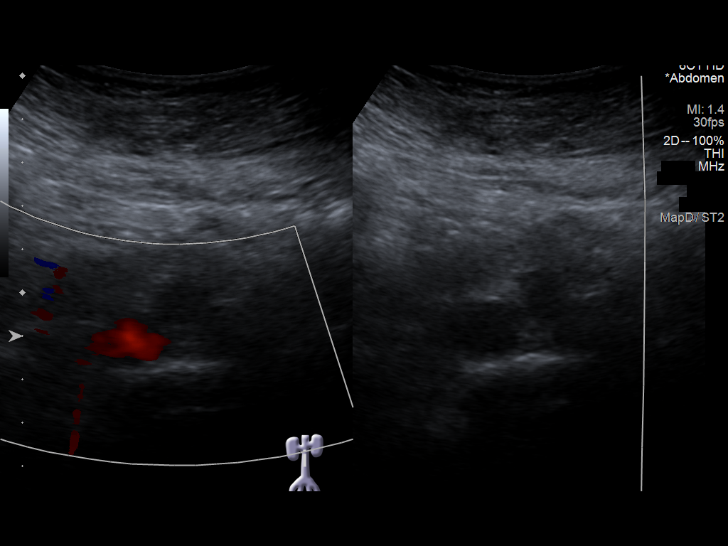

[14 of 25 positions shown; findings below may reference images not displayed]

FINDINGS: Gallbladder: No gallstones or wall thickening visualized. No
sonographic Murphy sign noted by sonographer.

Common bile duct: Diameter: 4 mm which is within normal limits.

Liver: No focal lesion identified. Within normal limits in
parenchymal echogenicity. Portal vein is patent on color Doppler
imaging with normal direction of blood flow towards the liver.

IVC: No abnormality visualized.

Pancreas: Visualized portion unremarkable.

Spleen: Size and appearance within normal limits.

Right Kidney: Length: 10.8 cm. Echogenicity within normal limits. No
mass or hydronephrosis visualized.

Left Kidney: Length: 11.5 cm. Echogenicity within normal limits. No
mass or hydronephrosis visualized.

Abdominal aorta: No aneurysm visualized.

Other findings: None.
IMPRESSION: No abnormality seen in the abdomen.

## 2021-12-27 DIAGNOSIS — E782 Mixed hyperlipidemia: Secondary | ICD-10-CM | POA: Diagnosis not present

## 2021-12-27 DIAGNOSIS — R7301 Impaired fasting glucose: Secondary | ICD-10-CM | POA: Diagnosis not present

## 2022-01-03 ENCOUNTER — Other Ambulatory Visit (HOSPITAL_COMMUNITY): Payer: Self-pay | Admitting: Family Medicine

## 2022-01-03 DIAGNOSIS — Z0001 Encounter for general adult medical examination with abnormal findings: Secondary | ICD-10-CM | POA: Diagnosis not present

## 2022-01-03 DIAGNOSIS — F17200 Nicotine dependence, unspecified, uncomplicated: Secondary | ICD-10-CM | POA: Diagnosis not present

## 2022-01-03 DIAGNOSIS — Z733 Stress, not elsewhere classified: Secondary | ICD-10-CM | POA: Diagnosis not present

## 2022-01-03 DIAGNOSIS — L989 Disorder of the skin and subcutaneous tissue, unspecified: Secondary | ICD-10-CM | POA: Diagnosis not present

## 2022-01-03 DIAGNOSIS — M546 Pain in thoracic spine: Secondary | ICD-10-CM

## 2022-01-03 DIAGNOSIS — E785 Hyperlipidemia, unspecified: Secondary | ICD-10-CM | POA: Diagnosis not present

## 2022-01-03 DIAGNOSIS — Z1231 Encounter for screening mammogram for malignant neoplasm of breast: Secondary | ICD-10-CM | POA: Diagnosis not present

## 2022-01-03 DIAGNOSIS — J449 Chronic obstructive pulmonary disease, unspecified: Secondary | ICD-10-CM | POA: Diagnosis not present

## 2022-01-15 DIAGNOSIS — B078 Other viral warts: Secondary | ICD-10-CM | POA: Diagnosis not present

## 2022-01-15 DIAGNOSIS — D485 Neoplasm of uncertain behavior of skin: Secondary | ICD-10-CM | POA: Diagnosis not present

## 2022-01-15 DIAGNOSIS — Z1283 Encounter for screening for malignant neoplasm of skin: Secondary | ICD-10-CM | POA: Diagnosis not present

## 2022-01-15 DIAGNOSIS — D225 Melanocytic nevi of trunk: Secondary | ICD-10-CM | POA: Diagnosis not present

## 2022-01-15 DIAGNOSIS — L570 Actinic keratosis: Secondary | ICD-10-CM | POA: Diagnosis not present

## 2022-01-15 DIAGNOSIS — X32XXXA Exposure to sunlight, initial encounter: Secondary | ICD-10-CM | POA: Diagnosis not present

## 2022-02-14 DIAGNOSIS — L570 Actinic keratosis: Secondary | ICD-10-CM | POA: Diagnosis not present

## 2022-02-14 DIAGNOSIS — X32XXXD Exposure to sunlight, subsequent encounter: Secondary | ICD-10-CM | POA: Diagnosis not present

## 2022-08-11 DIAGNOSIS — R509 Fever, unspecified: Secondary | ICD-10-CM | POA: Diagnosis not present

## 2022-08-11 DIAGNOSIS — U071 COVID-19: Secondary | ICD-10-CM | POA: Diagnosis not present

## 2022-08-11 DIAGNOSIS — Z6820 Body mass index (BMI) 20.0-20.9, adult: Secondary | ICD-10-CM | POA: Diagnosis not present

## 2022-10-15 DIAGNOSIS — E875 Hyperkalemia: Secondary | ICD-10-CM | POA: Diagnosis not present

## 2022-10-15 DIAGNOSIS — Z139 Encounter for screening, unspecified: Secondary | ICD-10-CM | POA: Diagnosis not present

## 2022-10-15 DIAGNOSIS — E785 Hyperlipidemia, unspecified: Secondary | ICD-10-CM | POA: Diagnosis not present

## 2022-10-18 DIAGNOSIS — Z7951 Long term (current) use of inhaled steroids: Secondary | ICD-10-CM | POA: Diagnosis not present

## 2022-10-18 DIAGNOSIS — F17210 Nicotine dependence, cigarettes, uncomplicated: Secondary | ICD-10-CM | POA: Diagnosis not present

## 2022-10-18 DIAGNOSIS — F439 Reaction to severe stress, unspecified: Secondary | ICD-10-CM | POA: Diagnosis not present

## 2022-10-18 DIAGNOSIS — J449 Chronic obstructive pulmonary disease, unspecified: Secondary | ICD-10-CM | POA: Diagnosis not present

## 2022-10-18 DIAGNOSIS — F17200 Nicotine dependence, unspecified, uncomplicated: Secondary | ICD-10-CM | POA: Diagnosis not present

## 2022-10-18 DIAGNOSIS — Z733 Stress, not elsewhere classified: Secondary | ICD-10-CM | POA: Diagnosis not present

## 2022-10-18 DIAGNOSIS — Z0001 Encounter for general adult medical examination with abnormal findings: Secondary | ICD-10-CM | POA: Diagnosis not present

## 2022-10-18 DIAGNOSIS — E785 Hyperlipidemia, unspecified: Secondary | ICD-10-CM | POA: Diagnosis not present

## 2022-10-19 ENCOUNTER — Other Ambulatory Visit (HOSPITAL_COMMUNITY): Payer: Self-pay | Admitting: Family Medicine

## 2022-10-19 ENCOUNTER — Ambulatory Visit (HOSPITAL_COMMUNITY)
Admission: RE | Admit: 2022-10-19 | Discharge: 2022-10-19 | Disposition: A | Discharge status: Home / Self Care | Payer: PPO | Source: Ambulatory Visit | Attending: Family Medicine | Admitting: Family Medicine

## 2022-10-19 DIAGNOSIS — M25561 Pain in right knee: Secondary | ICD-10-CM | POA: Diagnosis not present

## 2022-10-19 DIAGNOSIS — M25551 Pain in right hip: Secondary | ICD-10-CM | POA: Diagnosis not present

## 2022-10-19 DIAGNOSIS — M1611 Unilateral primary osteoarthritis, right hip: Secondary | ICD-10-CM | POA: Diagnosis not present

## 2022-11-05 DIAGNOSIS — M5441 Lumbago with sciatica, right side: Secondary | ICD-10-CM | POA: Diagnosis not present

## 2022-11-05 DIAGNOSIS — M9905 Segmental and somatic dysfunction of pelvic region: Secondary | ICD-10-CM | POA: Diagnosis not present

## 2022-11-05 DIAGNOSIS — M9903 Segmental and somatic dysfunction of lumbar region: Secondary | ICD-10-CM | POA: Diagnosis not present

## 2022-11-05 DIAGNOSIS — M9902 Segmental and somatic dysfunction of thoracic region: Secondary | ICD-10-CM | POA: Diagnosis not present

## 2022-11-07 DIAGNOSIS — M5441 Lumbago with sciatica, right side: Secondary | ICD-10-CM | POA: Diagnosis not present

## 2022-11-07 DIAGNOSIS — M9903 Segmental and somatic dysfunction of lumbar region: Secondary | ICD-10-CM | POA: Diagnosis not present

## 2022-11-07 DIAGNOSIS — M9902 Segmental and somatic dysfunction of thoracic region: Secondary | ICD-10-CM | POA: Diagnosis not present

## 2022-11-07 DIAGNOSIS — M9905 Segmental and somatic dysfunction of pelvic region: Secondary | ICD-10-CM | POA: Diagnosis not present

## 2022-11-16 DIAGNOSIS — M9902 Segmental and somatic dysfunction of thoracic region: Secondary | ICD-10-CM | POA: Diagnosis not present

## 2022-11-16 DIAGNOSIS — M9905 Segmental and somatic dysfunction of pelvic region: Secondary | ICD-10-CM | POA: Diagnosis not present

## 2022-11-16 DIAGNOSIS — M9903 Segmental and somatic dysfunction of lumbar region: Secondary | ICD-10-CM | POA: Diagnosis not present

## 2022-11-16 DIAGNOSIS — M5441 Lumbago with sciatica, right side: Secondary | ICD-10-CM | POA: Diagnosis not present

## 2022-11-19 DIAGNOSIS — M9902 Segmental and somatic dysfunction of thoracic region: Secondary | ICD-10-CM | POA: Diagnosis not present

## 2022-11-19 DIAGNOSIS — M9905 Segmental and somatic dysfunction of pelvic region: Secondary | ICD-10-CM | POA: Diagnosis not present

## 2022-11-19 DIAGNOSIS — M9903 Segmental and somatic dysfunction of lumbar region: Secondary | ICD-10-CM | POA: Diagnosis not present

## 2022-11-19 DIAGNOSIS — M5441 Lumbago with sciatica, right side: Secondary | ICD-10-CM | POA: Diagnosis not present

## 2022-11-26 DIAGNOSIS — M9903 Segmental and somatic dysfunction of lumbar region: Secondary | ICD-10-CM | POA: Diagnosis not present

## 2022-11-26 DIAGNOSIS — M9902 Segmental and somatic dysfunction of thoracic region: Secondary | ICD-10-CM | POA: Diagnosis not present

## 2022-11-26 DIAGNOSIS — M5441 Lumbago with sciatica, right side: Secondary | ICD-10-CM | POA: Diagnosis not present

## 2022-11-26 DIAGNOSIS — M9905 Segmental and somatic dysfunction of pelvic region: Secondary | ICD-10-CM | POA: Diagnosis not present

## 2022-12-18 ENCOUNTER — Other Ambulatory Visit (HOSPITAL_COMMUNITY): Payer: Self-pay | Admitting: Internal Medicine

## 2022-12-18 DIAGNOSIS — M1611 Unilateral primary osteoarthritis, right hip: Secondary | ICD-10-CM | POA: Diagnosis not present

## 2022-12-18 DIAGNOSIS — M25561 Pain in right knee: Secondary | ICD-10-CM | POA: Diagnosis not present

## 2022-12-18 DIAGNOSIS — M5441 Lumbago with sciatica, right side: Secondary | ICD-10-CM

## 2023-01-17 ENCOUNTER — Ambulatory Visit (HOSPITAL_COMMUNITY): Payer: Medicare HMO

## 2023-01-17 ENCOUNTER — Ambulatory Visit (HOSPITAL_COMMUNITY)
Admission: RE | Admit: 2023-01-17 | Discharge: 2023-01-17 | Disposition: A | Discharge status: Home / Self Care | Payer: Medicare HMO | Source: Ambulatory Visit | Attending: Internal Medicine | Admitting: Internal Medicine

## 2023-01-17 DIAGNOSIS — M5441 Lumbago with sciatica, right side: Secondary | ICD-10-CM | POA: Diagnosis not present

## 2023-01-17 DIAGNOSIS — M5126 Other intervertebral disc displacement, lumbar region: Secondary | ICD-10-CM | POA: Diagnosis not present

## 2023-01-17 MED ORDER — GADOBUTROL 1 MMOL/ML IV SOLN
5.0000 mL | Freq: Once | INTRAVENOUS | Status: AC | PRN
  Administered 2023-01-17: 5 mL via INTRAVENOUS

## 2023-02-06 DIAGNOSIS — M5441 Lumbago with sciatica, right side: Secondary | ICD-10-CM | POA: Diagnosis not present

## 2023-02-06 DIAGNOSIS — M9905 Segmental and somatic dysfunction of pelvic region: Secondary | ICD-10-CM | POA: Diagnosis not present

## 2023-02-06 DIAGNOSIS — M9903 Segmental and somatic dysfunction of lumbar region: Secondary | ICD-10-CM | POA: Diagnosis not present

## 2023-02-06 DIAGNOSIS — M9902 Segmental and somatic dysfunction of thoracic region: Secondary | ICD-10-CM | POA: Diagnosis not present

## 2023-02-13 DIAGNOSIS — M5441 Lumbago with sciatica, right side: Secondary | ICD-10-CM | POA: Diagnosis not present

## 2023-02-13 DIAGNOSIS — M9902 Segmental and somatic dysfunction of thoracic region: Secondary | ICD-10-CM | POA: Diagnosis not present

## 2023-02-13 DIAGNOSIS — M9905 Segmental and somatic dysfunction of pelvic region: Secondary | ICD-10-CM | POA: Diagnosis not present

## 2023-02-13 DIAGNOSIS — M9903 Segmental and somatic dysfunction of lumbar region: Secondary | ICD-10-CM | POA: Diagnosis not present

## 2023-02-18 DIAGNOSIS — M9905 Segmental and somatic dysfunction of pelvic region: Secondary | ICD-10-CM | POA: Diagnosis not present

## 2023-02-18 DIAGNOSIS — M5441 Lumbago with sciatica, right side: Secondary | ICD-10-CM | POA: Diagnosis not present

## 2023-02-18 DIAGNOSIS — M9902 Segmental and somatic dysfunction of thoracic region: Secondary | ICD-10-CM | POA: Diagnosis not present

## 2023-02-18 DIAGNOSIS — M9903 Segmental and somatic dysfunction of lumbar region: Secondary | ICD-10-CM | POA: Diagnosis not present

## 2023-02-25 DIAGNOSIS — M9903 Segmental and somatic dysfunction of lumbar region: Secondary | ICD-10-CM | POA: Diagnosis not present

## 2023-02-25 DIAGNOSIS — M546 Pain in thoracic spine: Secondary | ICD-10-CM | POA: Diagnosis not present

## 2023-02-25 DIAGNOSIS — M62830 Muscle spasm of back: Secondary | ICD-10-CM | POA: Diagnosis not present

## 2023-02-25 DIAGNOSIS — M9902 Segmental and somatic dysfunction of thoracic region: Secondary | ICD-10-CM | POA: Diagnosis not present

## 2023-02-25 DIAGNOSIS — M9905 Segmental and somatic dysfunction of pelvic region: Secondary | ICD-10-CM | POA: Diagnosis not present

## 2023-03-04 DIAGNOSIS — M9905 Segmental and somatic dysfunction of pelvic region: Secondary | ICD-10-CM | POA: Diagnosis not present

## 2023-03-04 DIAGNOSIS — M9903 Segmental and somatic dysfunction of lumbar region: Secondary | ICD-10-CM | POA: Diagnosis not present

## 2023-03-04 DIAGNOSIS — M5441 Lumbago with sciatica, right side: Secondary | ICD-10-CM | POA: Diagnosis not present

## 2023-03-04 DIAGNOSIS — M2569 Stiffness of other specified joint, not elsewhere classified: Secondary | ICD-10-CM | POA: Diagnosis not present

## 2023-03-04 DIAGNOSIS — M9902 Segmental and somatic dysfunction of thoracic region: Secondary | ICD-10-CM | POA: Diagnosis not present

## 2023-03-04 DIAGNOSIS — M2550 Pain in unspecified joint: Secondary | ICD-10-CM | POA: Diagnosis not present

## 2023-03-06 DIAGNOSIS — M2550 Pain in unspecified joint: Secondary | ICD-10-CM | POA: Diagnosis not present

## 2023-03-06 DIAGNOSIS — M2569 Stiffness of other specified joint, not elsewhere classified: Secondary | ICD-10-CM | POA: Diagnosis not present

## 2023-03-11 DIAGNOSIS — M2569 Stiffness of other specified joint, not elsewhere classified: Secondary | ICD-10-CM | POA: Diagnosis not present

## 2023-03-11 DIAGNOSIS — M2550 Pain in unspecified joint: Secondary | ICD-10-CM | POA: Diagnosis not present

## 2023-03-22 DIAGNOSIS — M2569 Stiffness of other specified joint, not elsewhere classified: Secondary | ICD-10-CM | POA: Diagnosis not present

## 2023-03-22 DIAGNOSIS — M2550 Pain in unspecified joint: Secondary | ICD-10-CM | POA: Diagnosis not present

## 2023-03-27 DIAGNOSIS — M2550 Pain in unspecified joint: Secondary | ICD-10-CM | POA: Diagnosis not present

## 2023-03-27 DIAGNOSIS — M2569 Stiffness of other specified joint, not elsewhere classified: Secondary | ICD-10-CM | POA: Diagnosis not present

## 2023-03-29 DIAGNOSIS — M2569 Stiffness of other specified joint, not elsewhere classified: Secondary | ICD-10-CM | POA: Diagnosis not present

## 2023-03-29 DIAGNOSIS — M2550 Pain in unspecified joint: Secondary | ICD-10-CM | POA: Diagnosis not present

## 2023-04-02 DIAGNOSIS — M2569 Stiffness of other specified joint, not elsewhere classified: Secondary | ICD-10-CM | POA: Diagnosis not present

## 2023-04-02 DIAGNOSIS — M2550 Pain in unspecified joint: Secondary | ICD-10-CM | POA: Diagnosis not present

## 2023-04-05 DIAGNOSIS — M5441 Lumbago with sciatica, right side: Secondary | ICD-10-CM | POA: Diagnosis not present

## 2023-04-05 DIAGNOSIS — M9902 Segmental and somatic dysfunction of thoracic region: Secondary | ICD-10-CM | POA: Diagnosis not present

## 2023-04-05 DIAGNOSIS — M9905 Segmental and somatic dysfunction of pelvic region: Secondary | ICD-10-CM | POA: Diagnosis not present

## 2023-04-05 DIAGNOSIS — M9903 Segmental and somatic dysfunction of lumbar region: Secondary | ICD-10-CM | POA: Diagnosis not present

## 2023-04-10 DIAGNOSIS — M25561 Pain in right knee: Secondary | ICD-10-CM | POA: Diagnosis not present

## 2023-04-10 DIAGNOSIS — J449 Chronic obstructive pulmonary disease, unspecified: Secondary | ICD-10-CM | POA: Diagnosis not present

## 2023-04-10 DIAGNOSIS — M4807 Spinal stenosis, lumbosacral region: Secondary | ICD-10-CM | POA: Diagnosis not present

## 2023-04-10 DIAGNOSIS — M47816 Spondylosis without myelopathy or radiculopathy, lumbar region: Secondary | ICD-10-CM | POA: Diagnosis not present

## 2023-04-10 DIAGNOSIS — Z87891 Personal history of nicotine dependence: Secondary | ICD-10-CM | POA: Diagnosis not present

## 2023-04-10 DIAGNOSIS — M5441 Lumbago with sciatica, right side: Secondary | ICD-10-CM | POA: Diagnosis not present

## 2023-04-10 DIAGNOSIS — M1611 Unilateral primary osteoarthritis, right hip: Secondary | ICD-10-CM | POA: Diagnosis not present

## 2023-05-27 DIAGNOSIS — M25511 Pain in right shoulder: Secondary | ICD-10-CM | POA: Diagnosis not present

## 2023-05-27 DIAGNOSIS — M542 Cervicalgia: Secondary | ICD-10-CM | POA: Diagnosis not present

## 2023-05-27 DIAGNOSIS — M546 Pain in thoracic spine: Secondary | ICD-10-CM | POA: Diagnosis not present

## 2023-06-14 DIAGNOSIS — M5416 Radiculopathy, lumbar region: Secondary | ICD-10-CM | POA: Diagnosis not present

## 2023-06-14 DIAGNOSIS — M542 Cervicalgia: Secondary | ICD-10-CM | POA: Diagnosis not present

## 2023-06-25 DIAGNOSIS — M5416 Radiculopathy, lumbar region: Secondary | ICD-10-CM | POA: Diagnosis not present

## 2023-07-22 DIAGNOSIS — M542 Cervicalgia: Secondary | ICD-10-CM | POA: Diagnosis not present

## 2023-07-22 DIAGNOSIS — M5416 Radiculopathy, lumbar region: Secondary | ICD-10-CM | POA: Diagnosis not present

## 2023-08-19 ENCOUNTER — Ambulatory Visit (HOSPITAL_BASED_OUTPATIENT_CLINIC_OR_DEPARTMENT_OTHER): Payer: PPO | Admitting: Physical Therapy

## 2023-09-03 ENCOUNTER — Ambulatory Visit (HOSPITAL_BASED_OUTPATIENT_CLINIC_OR_DEPARTMENT_OTHER): Payer: Medicare HMO | Attending: Anesthesiology | Admitting: Physical Therapy

## 2023-09-03 ENCOUNTER — Other Ambulatory Visit: Payer: Self-pay

## 2023-09-03 ENCOUNTER — Encounter (HOSPITAL_BASED_OUTPATIENT_CLINIC_OR_DEPARTMENT_OTHER): Payer: Self-pay | Admitting: Physical Therapy

## 2023-09-03 DIAGNOSIS — M25552 Pain in left hip: Secondary | ICD-10-CM | POA: Insufficient documentation

## 2023-09-03 DIAGNOSIS — M5459 Other low back pain: Secondary | ICD-10-CM | POA: Insufficient documentation

## 2023-09-03 DIAGNOSIS — M6281 Muscle weakness (generalized): Secondary | ICD-10-CM | POA: Diagnosis not present

## 2023-09-03 DIAGNOSIS — R262 Difficulty in walking, not elsewhere classified: Secondary | ICD-10-CM | POA: Insufficient documentation

## 2023-09-03 NOTE — Therapy (Signed)
OUTPATIENT PHYSICAL THERAPY THORACOLUMBAR EVALUATION   Patient Name: Monica Spence MRN: [id number] DOB:[birth year], 68 y.o., female Today's Date: 09/05/2023  END OF SESSION:     PT End of Session -     Visit Number 1   Number of Visits 16   Date for PT Re-Evaluation 2/33/25   Authorization Type    PT Start Time  916   PT Stop Time 954   PT Time Calculation (min) 38 min   Activity Tolerance    Behavior During Therapy       Past Medical History:  Diagnosis Date   COPD (chronic obstructive pulmonary disease) ([facility])    PONV (postoperative nausea and vomiting)    Past Surgical History:  Procedure Laterality Date   APPENDECTOMY     BIOPSY  05/27/2019   Procedure: BIOPSY;  Surgeon: Malissa Hippo, MD;  Location: [facility];  Service: Endoscopy;;   COLONOSCOPY     ESOPHAGOGASTRODUODENOSCOPY N/A 05/27/2019   Procedure: ESOPHAGOGASTRODUODENOSCOPY (EGD);  Surgeon: Malissa Hippo, MD;  Location: [facility];  Service: Endoscopy;  Laterality: N/A;  9:30   TUBAL LIGATION     Patient Active Problem List   Diagnosis Date Noted   Abdominal pain, epigastric 04/07/2019   Abnormal findings on diagnostic imaging of other abdominal regions, including retroperitoneum 04/07/2019    PCP: Nita Sells MD  REFERRING PROVIDER: Windle Guard, MD   REFERRING DIAG: M54.50 (ICD-10-CM) - Low back pain, unspecified   Rationale for Evaluation and Treatment: Rehabilitation  THERAPY DIAG:  Other low back pain  Difficulty in walking, not elsewhere classified  Muscle weakness (generalized)  Pain in left hip  ONSET DATE: exacerbation last few months  SUBJECTIVE:                                                                                                                                                                                           SUBJECTIVE STATEMENT: I have 2 little dogs that I have to walk and am having a hard time part is going up hill.  When I come back I hurt  so bad.  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 Interrupts sleep  PERTINENT HISTORY:  COPD  PAIN:  Are you having pain? Yes: NPRS scale: current 6/10; normal 4-5/10; worst 9/10; least 0/10 Pain location: across low lumbar spine to left hip Pain description: ache Aggravating factors: walking > 10 min Relieving factors: meloxicam and gabapentin, resting  PRECAUTIONS: None  RED FLAGS: None   WEIGHT BEARING RESTRICTIONS: No  FALLS:  Has patient fallen in  last 6 months?  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  DIAGNOSTIC FINDINGS:  Lumba\r MRI IMPRESSION: 1. At L3-4 there is a mild broad-based disc bulge with a left lateral annular fissure. Moderate bilateral facet arthropathy with ligamentum flavum infolding. Mild spinal stenosis. 2. At L4-5 there is a broad-based disc bulge with a right and left foraminal annular fissure. Severe bilateral facet arthropathy with ligamentum flavum infolding. Moderate spinal stenosis. 3. No acute osseous injury of the lumbar spine.  Hip x ray IMPRESSION: Mild degenerative changes in the right hip without loss of joint space.  PATIENT SURVEYS:  FOTO Primary score 39% with goal of 52%  COGNITION: Overall cognitive status: Within functional limits for tasks assessed     SENSATION: WFL  MUSCLE LENGTH: Hamstrings: tightness bilaterally, limited toleration of testing in sitting   POSTURE: rounded shoulders, forward head, and weight shift left  PALPATION: Moderate TTP throughout cervical to mid thoracic then lumbar spine paraspinals, traps,  latissimus, glutes  LUMBAR ROM:   AROM eval  Flexion FT to below patellaP!  Extension Limited 50%P!  Right lateral flexion Limited 90%P!  Left lateral flexion Limited 75%P!  Right rotation   Left rotation    (Blank rows = not tested)  LOWER EXTREMITY ROM:     WFL   LOWER EXTREMITY MMT:    MMT Right eval Left eval  Hip flexion 26.2 41.0  Hip extension    Hip abduction 12.6 18.7  Hip adduction    Hip internal rotation    Hip external rotation    Knee flexion    Knee extension 16.0 20.3  Ankle dorsiflexion    Ankle plantarflexion    Ankle inversion    Ankle eversion     (Blank rows = not tested)  LUMBAR SPECIAL TESTS:  Straight leg raise test: Positive rle and Slump test: Positive right  FUNCTIONAL TESTS:  5 times sit to stand: Not tolerated Timed up and go (TUG): 16.99 4 stage balance: Passed 1&2; tandem x 4s; SLS x6s  GAIT: Distance walked: 400 ft Assistive device utilized: None Level of assistance: Complete Independence Comments: off loading left, guarded posture, slowed cadence  TREATMENT  eval                                                                                                                            PATIENT EDUCATION:  Education details: Discussed eval findings, rehab rationale, aquatic program progression/POC and pools in area. Patient is in agreement  Person educated: Patient Education method: Explanation Education comprehension: verbalized understanding  HOME EXERCISE PROGRAM: To be assigned  ASSESSMENT:  CLINICAL IMPRESSION: Patient is a 68 y.o. f  who was seen today for physical therapy evaluation and treatment for LBP. She had a recent injection which improved her sciatic pain but LBP continues into left hip.  Main complaint is her inablility to walk her 2 small dogs on short path that includes a slight up hill area.  She also reports she seems to be getting weaker and she is afraid of falling.  She presents with deficits in LE  strength, endurance, activity toleration and functional mobility with ADL's.  She will benefit from skilled PT to improve all deficits and return pt to ability to walk dogs as well as safely care for sick husband.  OBJECTIVE IMPAIRMENTS: Abnormal gait, decreased activity tolerance, decreased balance, decreased endurance, decreased mobility, difficulty walking, decreased ROM, decreased strength, impaired flexibility, postural dysfunction, and pain.   ACTIVITY LIMITATIONS: carrying, lifting, sleeping, transfers, hygiene/grooming, caring for others, and walking dogs  PARTICIPATION LIMITATIONS: cleaning, shopping, community activity, and yard work  PERSONAL FACTORS: Age, Fitness, Past/current experiences, Time since onset of injury/illness/exacerbation, and 1-2 comorbidities: see PmHx  are also affecting patient's functional outcome.   REHAB POTENTIAL: Good  CLINICAL DECISION MAKING: Evolving/moderate complexity  EVALUATION COMPLEXITY: Moderate   GOALS: Goals reviewed with patient? Yes  SHORT TERM GOALS: Target date: 10/01/23  Pt will tolerate full aquatic sessions consistently without increase in pain and with improving function to demonstrate good toleration and effectiveness of intervention.  Baseline: Goal status: INITIAL  2.  Pt will have 25% improvement in lumbar ROM without increase in pain Baseline: see chart Goal status: INITIAL  3.  Pt will complete 10 consecutive STS transfers from water bench onto water step without UE support or increase in pain Baseline:  Goal status: INITIAL  4.  Pt will demonstrate improved toleration to activity by walking to and from setting, completing entire aquatic therapy session without reports of increased pain or fatigue Baseline:  Goal status: INITIAL  5.  Pt will perform SLS and tandem stance holding x 15s submerged in 3.6 ft unsupported to demonstrate improvement in balance Baseline:  Goal status: INITIAL  6.  Pt will report not waking  at night consistently due to pain. Baseline: nightly Goal status: INITIAL  LONG TERM GOALS: Target date: 11/02/23  Pt will improve on Foto score by at least 8% points to reach MDC demonstrating improved perception of function Goal Baseline: 30% Goal status: INITIAL  2.  Pt will tolerate walking dogs usual path including uphill without reports of increased LBP. Baseline: extreme pain upon completion Goal status: INITIAL  3.  Pt will improve strength in all areas listed by at least 10 lbs to demonstrate improved overall physical function Baseline: see chart Goal status: INITIAL  4.  Pt will improve on Tug test to <or= 13s to demonstrate improvement in lower extremity function, mobility and decreased fall risk. Baseline: 16.99 Goal status: INITIAL  5.  Pt will report decrease in "normal pain" by at least 3 NPRS for improved toleration to activity Baseline: 5/10 Goal status: INITIAL  6.  Pt will be indep with final HEP's (land and aquatic as appropriate) for continued management of condition Baseline: none Goal status: INITIAL  PLAN:  PT FREQUENCY: 2x/week  PT DURATION: 8 weeks  PLANNED INTERVENTIONS: 97164- PT Re-evaluation, 97110-Therapeutic exercises, 97530- Therapeutic activity, 97112- Neuromuscular re-education, 97535- Self Care, 16109- Manual therapy, [phone]- Gait training, [phone]- Orthotic Fit/training, [phone]- Aquatic Therapy, [phone]- Electrical stimulation (unattended), Patient/Family education, Balance training, Stair training, Taping, Dry Needling, Joint mobilization, DME instructions, Cryotherapy, and Moist heat.  PLAN FOR NEXT SESSION: Plan aquatics and land: le and core strengthening, core stabilization, stretching program, activity toleration, balance, pain management. Land consider DN   Rushie Chestnut) Aradia Estey MPT 09/05/23 1:47 PM [facility] [address] [place], [place], [address] Phone: [phone]   Fax:   [phone]

## 2023-09-16 ENCOUNTER — Ambulatory Visit (HOSPITAL_BASED_OUTPATIENT_CLINIC_OR_DEPARTMENT_OTHER): Payer: Medicare HMO | Attending: Anesthesiology | Admitting: Physical Therapy

## 2023-09-16 ENCOUNTER — Encounter (HOSPITAL_BASED_OUTPATIENT_CLINIC_OR_DEPARTMENT_OTHER): Payer: Self-pay | Admitting: Physical Therapy

## 2023-09-16 DIAGNOSIS — R262 Difficulty in walking, not elsewhere classified: Secondary | ICD-10-CM | POA: Insufficient documentation

## 2023-09-16 DIAGNOSIS — M5459 Other low back pain: Secondary | ICD-10-CM | POA: Insufficient documentation

## 2023-09-16 DIAGNOSIS — M6281 Muscle weakness (generalized): Secondary | ICD-10-CM | POA: Insufficient documentation

## 2023-09-16 DIAGNOSIS — M25552 Pain in left hip: Secondary | ICD-10-CM | POA: Insufficient documentation
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
## 2023-09-18 ENCOUNTER — Encounter (HOSPITAL_BASED_OUTPATIENT_CLINIC_OR_DEPARTMENT_OTHER): Payer: Self-pay | Admitting: Physical Therapy

## 2023-09-18 ENCOUNTER — Ambulatory Visit (HOSPITAL_BASED_OUTPATIENT_CLINIC_OR_DEPARTMENT_OTHER): Payer: Medicare HMO | Admitting: Physical Therapy

## 2023-09-18 DIAGNOSIS — M25552 Pain in left hip: Secondary | ICD-10-CM | POA: Diagnosis not present

## 2023-09-18 DIAGNOSIS — R262 Difficulty in walking, not elsewhere classified: Secondary | ICD-10-CM | POA: Diagnosis not present

## 2023-09-18 DIAGNOSIS — M6281 Muscle weakness (generalized): Secondary | ICD-10-CM | POA: Diagnosis not present

## 2023-09-18 DIAGNOSIS — M5459 Other low back pain: Secondary | ICD-10-CM

## 2023-09-18 NOTE — Therapy (Signed)
 OUTPATIENT PHYSICAL THERAPY THORACOLUMBAR TREATMENT   Patient Name: Monica Spence MRN: [id number] DOB:Apr 13, 1956, 68 y.o., female Today's Date: 09/18/2023  END OF SESSION:  PT End of Session - 09/18/23 0843     Visit Number 3    Number of Visits 16    Date for PT Re-Evaluation 11/02/23    Authorization Type [employer]    PT Start Time 0845    PT Stop Time 0925    PT Time Calculation (min) 40 min    Activity Tolerance Patient tolerated treatment well;No increased pain    Behavior During Therapy WFL for tasks assessed/performed              Past Medical History:  Diagnosis Date   COPD (chronic obstructive pulmonary disease) (HCC)    PONV (postoperative nausea and vomiting)    Past Surgical History:  Procedure Laterality Date   APPENDECTOMY     BIOPSY  05/27/2019   Procedure: BIOPSY;  Surgeon: Golda Claudis PENNER, MD;  [facility];  Service: Endoscopy;;   COLONOSCOPY     ESOPHAGOGASTRODUODENOSCOPY N/A 05/27/2019   Procedure: ESOPHAGOGASTRODUODENOSCOPY (EGD);  Surgeon: Golda Claudis PENNER, MD;  [facility];  Service: Endoscopy;  Laterality: N/A;  9:30   TUBAL LIGATION     Patient Active Problem List   Diagnosis Date Noted   Abdominal pain, epigastric 04/07/2019   Abnormal findings on diagnostic imaging of other abdominal regions, including retroperitoneum 04/07/2019    PCP: Norleen Hurst MD  REFERRING PROVIDER: Laqueta Ozell BIRCH, MD   REFERRING DIAG: M54.50 (ICD-10-CM) - Low back pain, unspecified   Rationale for Evaluation and Treatment: Rehabilitation  THERAPY DIAG:  Other low back pain  Difficulty in walking, not elsewhere classified  Muscle weakness (generalized)  Pain in left hip  ONSET DATE: exacerbation last few months  SUBJECTIVE:                                                                                                                                                                                            SUBJECTIVE STATEMENT: Pt reports good response from lat session with no residual discomfort.  Took a nap.  Neck discomfort this am.  PERTINENT HISTORY:  COPD  PAIN:  Are you having pain? Yes: NPRS scale: current 3/10  (took medicine just prior to session) Pain location: across low lumbar spine, radiating to R calf  Pain description: ache Aggravating factors: walking > 10 min Relieving factors: meloxicam and gabapentin, resting  PRECAUTIONS: None  RED FLAGS: None   WEIGHT BEARING RESTRICTIONS: No  FALLS:  Has patient fallen in last 6 months?  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  DIAGNOSTIC FINDINGS:  Lumba\r MRI IMPRESSION: 1. At L3-4 there is a mild broad-based disc bulge with a left lateral annular fissure. Moderate bilateral facet arthropathy with ligamentum flavum infolding. Mild spinal stenosis. 2. At L4-5 there is a broad-based disc bulge with a right and left foraminal annular fissure. Severe bilateral facet arthropathy with ligamentum flavum infolding. Moderate spinal stenosis. 3. No acute osseous injury of the lumbar spine.  Hip x ray IMPRESSION: Mild degenerative changes in the right hip without loss of joint space.  PATIENT SURVEYS:  FOTO Primary score 39% with goal of 52%  COGNITION: Overall cognitive status: Within functional limits for tasks assessed     SENSATION: WFL  MUSCLE LENGTH: Hamstrings: tightness bilaterally, limited toleration of testing in sitting   POSTURE: rounded shoulders, forward head, and weight shift left  PALPATION: Moderate TTP throughout cervical to mid thoracic then  lumbar spine paraspinals, traps, latissimus, glutes  LUMBAR ROM:   AROM eval  Flexion FT to below patellaP!  Extension Limited 50%P!  Right lateral flexion Limited 90%P!  Left lateral flexion Limited 75%P!  Right rotation   Left rotation    (Blank rows = not tested)  LOWER EXTREMITY ROM:     WFL   LOWER EXTREMITY MMT:    MMT Right eval Left eval  Hip flexion 26.2 41.0  Hip extension    Hip abduction 12.6 18.7  Hip adduction    Hip internal rotation    Hip external rotation    Knee flexion    Knee extension 16.0 20.3  Ankle dorsiflexion    Ankle plantarflexion    Ankle inversion    Ankle eversion     (Blank rows = not tested)  LUMBAR SPECIAL TESTS:  Straight leg raise test: Positive rle and Slump test: Positive right  FUNCTIONAL TESTS:  5 times sit to stand: Not tolerated Timed up and go (TUG): 16.99 4 stage balance: Passed 1&2; tandem x 4s; SLS x6s  GAIT: Distance walked: 400 ft Assistive device utilized: None Level of assistance: Complete Independence Comments: off loading left, guarded posture, slowed cadence  TREATMENT  Pt seen for aquatic therapy today.  Treatment took place in water  3.5-4.75 ft in depth at the [facility] pool. Temp of water  was 91.  Pt entered/exited the pool via stairs independently in step-to pattern with bilat rail.  - unsupported walking forward/ backward with cues for vertical trunk and reciprocal arm swing - side stepping with arm addct/abdct x 2 laps-> with rainbow hand floats x 2 lap shoulder add/abd - farmer carry with rainbow hand floats under water , walking forward/ backwards x 2 widths -> single carry x 2 widths R/L - TrA set with 1/2-> full hollow noodle in wide stance then staggered x 10 rep - return to walking forward/ backwards -step ups leading R/L bottom step x 10 - Straddling noodle, UE on yellow HB cycling x 4 laps; hip add/abd; skiing  Pt requires the buoyancy and hydrostatic pressure of water  for  support, and to offload joints by unweighting joint load by at least 50 % in navel deep water  and by at least 75-80% in chest to neck  deep water .  Viscosity of the water  is needed for resistance of strengthening. Water  current perturbations provides challenge to standing balance requiring increased core activation.                                                                                                                             PATIENT EDUCATION:  Education details: intro to aquatic therapy  Person educated: Patient Education method: Explanation Education comprehension: verbalized understanding  HOME EXERCISE PROGRAM: 09/16/23: issued posture and body mechanics hand out  ASSESSMENT:  CLINICAL IMPRESSION: Good response to initial aquatic session.  Progressed core strengthening adding to foam load and initiated increased quad/hamstring engagement with step ups.  Visually she appears to have more quad weakness vs hamstring with stair climbing.  VC and demonstration for eccentric control of quads and full knee extension (hamstrings) with stepping up.    From initial evaluation:  Patient is a 68 y.o. f who was seen today for physical therapy evaluation and treatment for LBP. She had a recent injection which improved her sciatic pain but LBP continues into left hip.  Main complaint is her inablility to walk her 2 small dogs on short path that includes a slight up hill area.  She also reports she seems to be getting weaker and she is afraid of falling.  She presents with deficits in LE strength, endurance, activity toleration and functional mobility with ADL's.  She will benefit from skilled PT to improve all deficits and return pt to ability to walk dogs as well as safely care for sick husband.  OBJECTIVE IMPAIRMENTS: Abnormal gait, decreased activity tolerance, decreased balance, decreased endurance, decreased mobility, difficulty walking, decreased ROM, decreased strength, impaired  flexibility, postural dysfunction, and pain.   ACTIVITY LIMITATIONS: carrying, lifting, sleeping, transfers, hygiene/grooming, caring for others, and walking dogs  PARTICIPATION LIMITATIONS: cleaning, shopping, community activity, and yard work  PERSONAL FACTORS: Age, Fitness, Past/current experiences, Time since onset of injury/illness/exacerbation, and 1-2 comorbidities: see PmHx  are also affecting patient's functional outcome.   REHAB POTENTIAL: Good  CLINICAL DECISION MAKING: Evolving/moderate complexity  EVALUATION COMPLEXITY: Moderate   GOALS: Goals reviewed with patient? Yes  SHORT TERM GOALS: Target date: 10/01/23  Pt will tolerate full aquatic sessions consistently without increase in pain and with improving function to demonstrate good toleration and effectiveness of intervention.  Baseline: Goal status: INITIAL  2.  Pt will have 25% improvement in lumbar ROM without increase in pain Baseline: see chart Goal status: INITIAL  3.  Pt will complete 10 consecutive STS transfers from water  bench onto water  step without UE support or increase in pain Baseline:  Goal status: INITIAL  4.  Pt will demonstrate improved toleration to activity by walking to and from setting, completing entire aquatic therapy session without reports of increased pain or fatigue Baseline:  Goal status: INITIAL  5.  Pt will perform SLS and tandem stance holding x 15s submerged in 3.6 ft unsupported to demonstrate improvement in  balance Baseline:  Goal status: INITIAL  6.  Pt will report not waking at night consistently due to pain. Baseline: nightly Goal status: INITIAL  LONG TERM GOALS: Target date: 11/02/23  Pt will improve on Foto score by at least 8% points to reach MDC demonstrating improved perception of function Goal Baseline: 30% Goal status: INITIAL  2.  Pt will tolerate walking dogs usual path including uphill without reports of increased LBP. Baseline: extreme pain upon  completion Goal status: INITIAL  3.  Pt will improve strength in all areas listed by at least 10 lbs to demonstrate improved overall physical function Baseline: see chart Goal status: INITIAL  4.  Pt will improve on Tug test to <or= 13s to demonstrate improvement in lower extremity function, mobility and decreased fall risk. Baseline: 16.99 Goal status: INITIAL  5.  Pt will report decrease in normal pain by at least 3 NPRS for improved toleration to activity Baseline: 5/10 Goal status: INITIAL  6.  Pt will be indep with final HEP's (land and aquatic as appropriate) for continued management of condition Baseline: none Goal status: INITIAL  PLAN:  PT FREQUENCY: 2x/week  PT DURATION: 8 weeks  PLANNED INTERVENTIONS: 97164- PT Re-evaluation, 97110-Therapeutic exercises, 97530- Therapeutic activity, 97112- Neuromuscular re-education, 97535- Self Care, [id number]- Manual therapy, [phone]- Gait training, [phone]- Orthotic Fit/training, [phone]- Aquatic Therapy, [phone]- Electrical stimulation (unattended), Patient/Family education, Balance training, Stair training, Taping, Dry Needling, Joint mobilization, DME instructions, Cryotherapy, and Moist heat.  PLAN FOR NEXT SESSION: Plan aquatics and land: le and core strengthening, core stabilization, stretching program, activity toleration, balance, pain management. Land consider DN  Ronal Foots) Aqu[place]ah Devin MPT 09/18/23 8:44 AM [facility] [address] [place], [place], [address] Phone: [phone]   Fax:  [phone]

## 2023-09-23 ENCOUNTER — Ambulatory Visit (HOSPITAL_BASED_OUTPATIENT_CLINIC_OR_DEPARTMENT_OTHER): Payer: PPO | Admitting: Physical Therapy

## 2023-09-23 DIAGNOSIS — M6281 Muscle weakness (generalized): Secondary | ICD-10-CM

## 2023-09-23 DIAGNOSIS — R262 Difficulty in walking, not elsewhere classified: Secondary | ICD-10-CM | POA: Diagnosis not present

## 2023-09-23 DIAGNOSIS — M5459 Other low back pain: Secondary | ICD-10-CM | POA: Diagnosis not present

## 2023-09-23 DIAGNOSIS — M25552 Pain in left hip: Secondary | ICD-10-CM | POA: Diagnosis not present

## 2023-09-23 NOTE — Therapy (Signed)
 OUTPATIENT PHYSICAL THERAPY THORACOLUMBAR TREATMENT   Patient Name: Monica Spence MRN: [id number] DOB:1956-07-24, 68 y.o., female Today's Date: 09/23/2023  END OF SESSION:  PT End of Session - 09/23/23 1108     Visit Number 4    Number of Visits 16    Date for PT Re-Evaluation 11/02/23    Authorization Type [employer]    PT Start Time 1102    PT Stop Time 1140    PT Time Calculation (min) 38 min    Activity Tolerance Patient tolerated treatment well;No increased pain    Behavior During Therapy WFL for tasks assessed/performed              Past Medical History:  Diagnosis Date   COPD (chronic obstructive pulmonary disease) (HCC)    PONV (postoperative nausea and vomiting)    Past Surgical History:  Procedure Laterality Date   APPENDECTOMY     BIOPSY  05/27/2019   Procedure: BIOPSY;  Surgeon: Ruby Corporal, MD;  [facility];  Service: Endoscopy;;   COLONOSCOPY     ESOPHAGOGASTRODUODENOSCOPY N/A 05/27/2019   Procedure: ESOPHAGOGASTRODUODENOSCOPY (EGD);  Surgeon: Ruby Corporal, MD;  [facility];  Service: Endoscopy;  Laterality: N/A;  9:30   TUBAL LIGATION     Patient Active Problem List   Diagnosis Date Noted   Abdominal pain, epigastric 04/07/2019   Abnormal findings on diagnostic imaging of other abdominal regions, including retroperitoneum 04/07/2019    PCP: Denman Fischer MD  REFERRING PROVIDER: Michele Ahle, MD   REFERRING DIAG: M54.50 (ICD-10-CM) - Low back pain, unspecified   Rationale for Evaluation and Treatment: Rehabilitation  THERAPY DIAG:  No diagnosis found.  ONSET DATE: exacerbation last few months  SUBJECTIVE:                                                                                                                                                                                           SUBJECTIVE STATEMENT: Pt reports she slipped in drive way and twisted her (R) hip causing some pain.  The  hot tub jets made her (R) neck feel better after session.   PERTINENT HISTORY:  COPD  PAIN:  Are you having pain? Yes: NPRS scale: current 4-5/10  Pain location: R hip  Pain description: ache Aggravating factors: walking > 10 min Relieving factors: meloxicam and gabapentin, resting  PRECAUTIONS: None  RED FLAGS: None   WEIGHT BEARING RESTRICTIONS: No  FALLS:  Has patient fallen in last 6 months?  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  DIAGNOSTIC FINDINGS:  Lumba\r MRI IMPRESSION: 1. At L3-4 there is a mild broad-based disc bulge with a left lateral annular fissure. Moderate bilateral facet arthropathy with ligamentum flavum infolding. Mild spinal stenosis. 2. At L4-5 there is a broad-based disc bulge with a right and left foraminal annular fissure. Severe bilateral facet arthropathy with ligamentum flavum infolding. Moderate spinal stenosis. 3. No acute osseous injury of the lumbar spine.  Hip x ray IMPRESSION: Mild degenerative changes in the right hip without loss of joint space.  PATIENT SURVEYS:  FOTO Primary score 39% with goal of 52%  COGNITION: Overall cognitive status: Within functional limits for tasks assessed     SENSATION: WFL  MUSCLE LENGTH: Hamstrings: tightness bilaterally, limited toleration of testing in sitting   POSTURE: rounded shoulders, forward head, and weight shift left  PALPATION: Moderate TTP throughout cervical to mid thoracic then lumbar spine paraspinals, traps, latissimus, glutes  LUMBAR ROM:   AROM eval  Flexion FT to below patellaP!  Extension Limited 50%P!  Right lateral  flexion Limited 90%P!  Left lateral flexion Limited 75%P!  Right rotation   Left rotation    (Blank rows = not tested)  LOWER EXTREMITY ROM:     WFL   LOWER EXTREMITY MMT:    MMT Right eval Left eval  Hip flexion 26.2 41.0  Hip extension    Hip abduction 12.6 18.7  Hip adduction    Hip internal rotation    Hip external rotation    Knee flexion    Knee extension 16.0 20.3  Ankle dorsiflexion    Ankle plantarflexion    Ankle inversion    Ankle eversion     (Blank rows = not tested)  LUMBAR SPECIAL TESTS:  Straight leg raise test: Positive rle and Slump test: Positive right  FUNCTIONAL TESTS:  5 times sit to stand: Not tolerated Timed up and go (TUG): 16.99 4 stage balance: Passed 1&2; tandem x 4s; SLS x6s  GAIT: Distance walked: 400 ft Assistive device utilized: None Level of assistance: Complete Independence Comments: off loading left, guarded posture, slowed cadence  TREATMENT  Pt seen for aquatic therapy today.  Treatment took place in water 3.5-4.75 ft in depth at the [facility] pool. Temp of water was 91.  Pt entered/exited the pool via stairs independently in step-to pattern with bilat rail.  - unsupported walking forward/ backward with cues for vertical trunk and reciprocal arm swingx 3 laps - side stepping with arm addct/abdct x 1 laps-> with rainbow hand floats  shoulder add/abd - farmer carry with rainbow hand floats under water, walking forward/ backwards x 1 lap -> single carry with yellow x 2 widths R/L - Staggered stance with horiz abdct/ addct with rainbow hand floats x 8 each LE forward; with kickboard row x 10 each LE forward - return to walking forward/ backwards - TrA set with full hollow noodle pull downs to thighs in wide stance x 10 rep; staggered stance x 10 - forward walking kicks  - Straddling noodle, UE  doggie paddle x 4 laps; hip abdct/ addct and cross country ski legs suspended - attempted fig 4 stretch at stairs - unable to  complete with RLE (tight!)   Pt requires the buoyancy and hydrostatic pressure of water  for support, and to offload joints by unweighting joint load by at least 50 % in navel deep water and by at least 75-80% in chest to neck deep water.  Viscosity of the water is needed for resistance of strengthening. Water current perturbations provides challenge to standing balance requiring increased core activation.                                                                                                                             PATIENT EDUCATION:  Education details: intro to aquatic therapy  Person educated: Patient Education method: Explanation Education comprehension: verbalized understanding  HOME EXERCISE PROGRAM: 09/16/23: issued posture and body mechanics hand out  ASSESSMENT:  CLINICAL IMPRESSION: Good response to aquatic therapy thus far.  She reported no change in Rt hip pain when exercising in the water, except when attempting to complete fig4 stretch which increased her pain.  Good tolerance for all other exercises completed. Requires occasional cues for more upright head posture.  Trial SLS in water next visit.  Goals are ongoing.     From initial evaluation:  Patient is a 68 y.o. f who was seen today for physical therapy evaluation and treatment for LBP. She had a recent injection which improved her sciatic pain but LBP continues into right hip.  Main complaint is her inablility to walk her 2 small dogs on short path that includes a slight up hill area.  She also reports she seems to be getting weaker and she is afraid of falling.  She presents with deficits in LE strength, endurance, activity toleration and functional mobility with ADL's.  She will benefit from skilled PT to improve all deficits and return pt to ability to walk dogs as well as safely care for sick husband.  OBJECTIVE IMPAIRMENTS: Abnormal gait, decreased activity tolerance, decreased balance, decreased  endurance, decreased mobility, difficulty walking, decreased ROM, decreased strength, impaired flexibility, postural dysfunction, and pain.   ACTIVITY LIMITATIONS: carrying, lifting, sleeping, transfers, hygiene/grooming, caring for others, and walking dogs  PARTICIPATION LIMITATIONS: cleaning, shopping, community activity, and yard work  PERSONAL FACTORS: Age, Fitness, Past/current experiences, Time since onset of injury/illness/exacerbation, and 1-2 comorbidities: see PmHx  are also affecting patient's functional outcome.   REHAB POTENTIAL: Good  CLINICAL DECISION MAKING: Evolving/moderate complexity  EVALUATION COMPLEXITY: Moderate   GOALS: Goals reviewed with patient? Yes  SHORT TERM GOALS: Target date: 10/01/23  Pt will tolerate full aquatic sessions consistently without increase in pain and with improving function to demonstrate good toleration and effectiveness of intervention.  Baseline: Goal status: In progress - 09/23/23  2.  Pt will have 25% improvement in lumbar ROM without increase in pain Baseline: see chart Goal status: INITIAL  3.  Pt will complete 10 consecutive STS transfers from water bench onto water step without UE support or increase in pain Baseline:  Goal status: INITIAL  4.  Pt will demonstrate improved toleration to activity by walking to and from setting, completing  entire aquatic therapy session without reports of increased pain or fatigue Baseline:  Goal status: INITIAL  5.  Pt will perform SLS and tandem stance holding x 15s submerged in 3.6 ft unsupported to demonstrate improvement in balance Baseline:  Goal status: INITIAL  6.  Pt will report not waking at night consistently due to pain. Baseline: nightly Goal status: INITIAL  LONG TERM GOALS: Target date: 11/02/23  Pt will improve on Foto score by at least 8% points to reach MDC demonstrating improved perception of function Goal Baseline: 30% Goal status: INITIAL  2.  Pt will tolerate  walking dogs usual path including uphill without reports of increased LBP. Baseline: extreme pain upon completion Goal status: INITIAL  3.  Pt will improve strength in all areas listed by at least 10 lbs to demonstrate improved overall physical function Baseline: see chart Goal status: INITIAL  4.  Pt will improve on Tug test to <or= 13s to demonstrate improvement in lower extremity function, mobility and decreased fall risk. Baseline: 16.99 Goal status: INITIAL  5.  Pt will report decrease in "normal pain" by at least 3 NPRS for improved toleration to activity Baseline: 5/10 Goal status: INITIAL  6.  Pt will be indep with final HEP's (land and aquatic as appropriate) for continued management of condition Baseline: none Goal status: INITIAL  PLAN:  PT FREQUENCY: 2x/week  PT DURATION: 8 weeks  PLANNED INTERVENTIONS: 97164- PT Re-evaluation, 97110-Therapeutic exercises, 97530- Therapeutic activity, 97112- Neuromuscular re-education, 97535- Self Care, 95638- Manual therapy, [phone]- Gait training, [phone]- Orthotic Fit/training, [phone]- Aquatic Therapy, [phone]- Electrical stimulation (unattended), Patient/Family education, Balance training, Stair training, Taping, Dry Needling, Joint mobilization, DME instructions, Cryotherapy, and Moist heat.  PLAN FOR NEXT SESSION: Plan aquatics and land: le and core strengthening, core stabilization, stretching program, activity toleration, balance, pain management. Land consider DN  Almedia Jacobsen, [place] 09/23/23 11:45 AM [facility] [address] [place], [place], [address] Phone: [phone]   Fax:  [phone]

## 2023-09-25 ENCOUNTER — Encounter (HOSPITAL_BASED_OUTPATIENT_CLINIC_OR_DEPARTMENT_OTHER): Payer: Self-pay | Admitting: Physical Therapy

## 2023-09-25 ENCOUNTER — Ambulatory Visit (HOSPITAL_BASED_OUTPATIENT_CLINIC_OR_DEPARTMENT_OTHER): Payer: Medicare HMO | Admitting: Physical Therapy

## 2023-09-25 DIAGNOSIS — M25552 Pain in left hip: Secondary | ICD-10-CM

## 2023-09-25 DIAGNOSIS — M6281 Muscle weakness (generalized): Secondary | ICD-10-CM | POA: Diagnosis not present

## 2023-09-25 DIAGNOSIS — M5459 Other low back pain: Secondary | ICD-10-CM | POA: Diagnosis not present

## 2023-09-25 DIAGNOSIS — R262 Difficulty in walking, not elsewhere classified: Secondary | ICD-10-CM | POA: Diagnosis not present

## 2023-09-25 NOTE — Therapy (Signed)
 OUTPATIENT PHYSICAL THERAPY THORACOLUMBAR TREATMENT   Patient Name: Monica Spence MRN: [id number] DOB:02-May-1956, 68 y.o., female Today's Date: 09/25/2023  END OF SESSION:  PT End of Session - 09/25/23 1058     Visit Number 5    Number of Visits 16    Date for PT Re-Evaluation 11/02/23    Authorization Type [employer]    PT Start Time 1100    PT Stop Time 1140    PT Time Calculation (min) 40 min    Activity Tolerance Patient tolerated treatment well;No increased pain    Behavior During Therapy WFL for tasks assessed/performed              Past Medical History:  Diagnosis Date   COPD (chronic obstructive pulmonary disease) (HCC)    PONV (postoperative nausea and vomiting)    Past Surgical History:  Procedure Laterality Date   APPENDECTOMY     BIOPSY  05/27/2019   Procedure: BIOPSY;  Surgeon: Malissa Hippo, MD;  [facility];  Service: Endoscopy;;   COLONOSCOPY     ESOPHAGOGASTRODUODENOSCOPY N/A 05/27/2019   Procedure: ESOPHAGOGASTRODUODENOSCOPY (EGD);  Surgeon: Malissa Hippo, MD;  [facility];  Service: Endoscopy;  Laterality: N/A;  9:30   TUBAL LIGATION     Patient Active Problem List   Diagnosis Date Noted   Abdominal pain, epigastric 04/07/2019   Abnormal findings on diagnostic imaging of other abdominal regions, including retroperitoneum 04/07/2019    PCP: Nita Sells MD  REFERRING PROVIDER: Windle Guard, MD   REFERRING DIAG: M54.50 (ICD-10-CM) - Low back pain, unspecified   Rationale for Evaluation and Treatment: Rehabilitation  THERAPY DIAG:  Other low back pain  Difficulty in walking, not elsewhere classified  Muscle weakness (generalized)  Pain in left hip  ONSET DATE: exacerbation last few months  SUBJECTIVE:                                                                                                                                                                                            SUBJECTIVE STATEMENT: Pt reports overall pain slightly reduced.  Some fatigue with slight soreness after aquatic sessions.  PERTINENT HISTORY:  COPD  PAIN:  Are you having pain? Yes: NPRS scale: current 3/10  Pain location: R hip  Pain description: ache Aggravating factors: walking > 10 min Relieving factors: meloxicam and gabapentin, resting  PRECAUTIONS: None  RED FLAGS: None   WEIGHT BEARING RESTRICTIONS: No  FALLS:  Has patient fallen in last 6 months?  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  DIAGNOSTIC FINDINGS:  Lumba\r MRI IMPRESSION: 1. At L3-4 there is a mild broad-based disc bulge with a left lateral annular fissure. Moderate bilateral facet arthropathy with ligamentum flavum infolding. Mild spinal stenosis. 2. At L4-5 there is a broad-based disc bulge with a right and left foraminal annular fissure. Severe bilateral facet arthropathy with ligamentum flavum infolding. Moderate spinal stenosis. 3. No acute osseous injury of the lumbar spine.  Hip x ray IMPRESSION: Mild degenerative changes in the right hip without loss of joint space.  PATIENT SURVEYS:  FOTO Primary score 39% with goal of 52%  COGNITION: Overall cognitive status: Within functional limits for tasks assessed     SENSATION: WFL  MUSCLE LENGTH: Hamstrings: tightness bilaterally, limited toleration of testing in sitting   POSTURE: rounded shoulders, forward head, and weight shift left  PALPATION: Moderate TTP throughout cervical to mid thoracic then lumbar spine paraspinals, traps, latissimus, glutes  LUMBAR ROM:   AROM eval  Flexion  FT to below patellaP!  Extension Limited 50%P!  Right lateral flexion Limited 90%P!  Left lateral flexion Limited 75%P!  Right rotation   Left rotation    (Blank rows = not tested)  LOWER EXTREMITY ROM:     WFL   LOWER EXTREMITY MMT:    MMT Right eval Left eval  Hip flexion 26.2 41.0  Hip extension    Hip abduction 12.6 18.7  Hip adduction    Hip internal rotation    Hip external rotation    Knee flexion    Knee extension 16.0 20.3  Ankle dorsiflexion    Ankle plantarflexion    Ankle inversion    Ankle eversion     (Blank rows = not tested)  LUMBAR SPECIAL TESTS:  Straight leg raise test: Positive rle and Slump test: Positive right  FUNCTIONAL TESTS:  5 times sit to stand: Not tolerated Timed up and go (TUG): 16.99 4 stage balance: Passed 1&2; tandem x 4s; SLS x6s  GAIT: Distance walked: 400 ft Assistive device utilized: None Level of assistance: Complete Independence Comments: off loading left, guarded posture, slowed cadence  TREATMENT  Pt seen for aquatic therapy today.  Treatment took place in water 3.5-4.75 ft in depth at the [facility] pool. Temp of water was 91.  Pt entered/exited the pool via stairs independently in step-to pattern with bilat rail.  - unsupported walking forward/ backward with cues for vertical trunk and reciprocal arm swingx 6 laps - side stepping with arm addct/abdct with rainbow hand floats x 4 - farmer carry with rainbow hand floats under water, walking forward/ backwards x 2 laps -> single carry with yellow x 2 widths R/L - Staggered stance with horiz abdct/ addct with rainbow hand floats x 8 each LE forward - Bow&Arrow demonstration and vc for execution - TrA set with full hollow noodle pull downs to thighs in wide stance x 10 rep; staggered stance x 10 - SLS ue support blue noodle 3.75ft->3.6 ft after several tries 20 s R/L.  More difficult on left - hip hinging x 8 with verbal and tactile cues.  Good execution.   -  forward walking kicks  - Straddling noodle, UE  doggie paddle x 4 laps; hip abdct/ addct and cross country ski legs  suspended  - return to walking forward/ backwards between exercise sets for recovery  Pt requires the buoyancy and hydrostatic pressure of water for support, and to offload joints by unweighting joint load by at least 50 % in navel deep water and by at least 75-80% in chest to neck deep water.  Viscosity of the water is needed for resistance of strengthening. Water current perturbations provides challenge to standing balance requiring increased core activation.                                                                                                                             PATIENT EDUCATION:  Education details: intro to aquatic therapy  Person educated: Patient Education method: Explanation Education comprehension: verbalized understanding  HOME EXERCISE PROGRAM: 09/16/23: issued posture and body mechanics hand out  ASSESSMENT:  CLINICAL IMPRESSION: Progressed post core engagement with hip hinging with good execution.  Able to gain adequate LB stretch in forward hinges position.  She does struggle with initial recruitment of post core muscle to engage returning to upright position.  No pain reports decrease in discomfort  of LB with exercise. Continued VC for core engagement throughout session.  She reports remembering to engage core when amb dogs and states she is able to walk further distances since incorporating. Goals ongoing      From initial evaluation:  Patient is a 68 y.o. f who was seen today for physical therapy evaluation and treatment for LBP. She had a recent injection which improved her sciatic pain but LBP continues into right hip.  Main complaint is her inablility to walk her 2 small dogs on short path that includes a slight up hill area.  She also reports she seems to be getting weaker and she is afraid of falling.  She presents with deficits in LE  strength, endurance, activity toleration and functional mobility with ADL's.  She will benefit from skilled PT to improve all deficits and return pt to ability to walk dogs as well as safely care for sick husband.  OBJECTIVE IMPAIRMENTS: Abnormal gait, decreased activity tolerance, decreased balance, decreased endurance, decreased mobility, difficulty walking, decreased ROM, decreased strength, impaired flexibility, postural dysfunction, and pain.   ACTIVITY LIMITATIONS: carrying, lifting, sleeping, transfers, hygiene/grooming, caring for others, and walking dogs  PARTICIPATION LIMITATIONS: cleaning, shopping, community activity, and yard work  PERSONAL FACTORS: Age, Fitness, Past/current experiences, Time since onset of injury/illness/exacerbation, and 1-2 comorbidities: see PmHx  are also affecting patient's functional outcome.   REHAB POTENTIAL: Good  CLINICAL DECISION MAKING: Evolving/moderate complexity  EVALUATION COMPLEXITY: Moderate   GOALS: Goals reviewed with patient? Yes  SHORT TERM GOALS: Target date: 10/01/23  Pt will tolerate full aquatic sessions consistently without increase in pain and with improving function to demonstrate good toleration and effectiveness of intervention.  Baseline: Goal status: In progress - 09/23/23  2.  Pt will have 25% improvement in lumbar ROM without increase in pain Baseline: see chart Goal status: INITIAL  3.  Pt will complete 10 consecutive STS transfers from water bench onto water step without UE support or increase in pain Baseline:  Goal status: In progress complete once indep 09/25/23  4.  Pt will demonstrate improved toleration to activity by walking to and from setting, completing entire aquatic therapy session without reports of increased pain or fatigue Baseline:  Goal status: INITIAL  5.  Pt will perform SLS and tandem stance holding x 15s submerged in 3.6 ft unsupported to demonstrate improvement in balance Baseline:  Goal  status: In progress 09/25/23 completes with ue support  6.  Pt will report not waking at night consistently due to pain. Baseline: nightly Goal status: In progress with help form gabapentin 09/25/23  LONG TERM GOALS: Target date: 11/02/23  Pt will improve on Foto score by at least 8% points to reach MDC demonstrating improved perception of function Goal Baseline: 30% Goal status: INITIAL  2.  Pt will tolerate walking dogs usual path including uphill without reports of increased LBP. Baseline: extreme pain upon completion Goal status: INITIAL  3.  Pt will improve strength in all areas listed by at least 10 lbs to demonstrate improved overall physical function Baseline: see chart Goal status: INITIAL  4.  Pt will improve on Tug test to <or= 13s to demonstrate improvement in lower extremity function, mobility and decreased fall risk. Baseline: 16.99 Goal status: INITIAL  5.  Pt will report decrease in "normal pain" by at least 3 NPRS for improved toleration to activity Baseline: 5/10 Goal status: INITIAL  6.  Pt will be indep with final HEP's (land and aquatic as appropriate) for continued management of condition Baseline: none Goal status: INITIAL  PLAN:  PT FREQUENCY: 2x/week  PT DURATION: 8 weeks  PLANNED INTERVENTIONS: 97164- PT Re-evaluation, 97110-Therapeutic exercises, 97530- Therapeutic activity, 97112- Neuromuscular re-education, 97535- Self Care, 40981- Manual therapy, [phone]- Gait training, [phone]- Orthotic Fit/training, [phone]- Aquatic Therapy, [phone]- Electrical stimulation (unattended), Patient/Family education, Balance training, Stair training, Taping, Dry Needling, Joint mobilization, DME instructions, Cryotherapy, and Moist heat.  PLAN FOR NEXT SESSION: Plan aquatics and land: le and core strengthening, core stabilization, stretching program, activity toleration, balance, pain management. Land consider DN  Monica Spence) Monica Spence MPT 09/25/23 11:07 AM [facility] [address] [place], [place], [address] Phone: [phone]   Fax:  [phone]

## 2023-10-01 ENCOUNTER — Ambulatory Visit (HOSPITAL_BASED_OUTPATIENT_CLINIC_OR_DEPARTMENT_OTHER): Payer: PPO | Admitting: Physical Therapy

## 2023-10-04 ENCOUNTER — Ambulatory Visit (HOSPITAL_BASED_OUTPATIENT_CLINIC_OR_DEPARTMENT_OTHER): Payer: Medicare HMO | Admitting: Physical Therapy

## 2023-10-04 ENCOUNTER — Encounter (HOSPITAL_BASED_OUTPATIENT_CLINIC_OR_DEPARTMENT_OTHER): Payer: Self-pay | Admitting: Physical Therapy

## 2023-10-04 DIAGNOSIS — M6281 Muscle weakness (generalized): Secondary | ICD-10-CM

## 2023-10-04 DIAGNOSIS — R262 Difficulty in walking, not elsewhere classified: Secondary | ICD-10-CM

## 2023-10-04 DIAGNOSIS — M5459 Other low back pain: Secondary | ICD-10-CM

## 2023-10-04 DIAGNOSIS — M25552 Pain in left hip: Secondary | ICD-10-CM | POA: Diagnosis not present

## 2023-10-04 NOTE — Therapy (Signed)
 OUTPATIENT PHYSICAL THERAPY THORACOLUMBAR TREATMENT   Patient Name: Monica Spence MRN: [id number] DOB:Jul 12, 1956, 68 y.o., female Today's Date: 10/04/2023  END OF SESSION:  PT End of Session - 10/04/23 1057     Visit Number 6    Number of Visits 16    Date for PT Re-Evaluation 11/02/23    Authorization Type [employer]    PT Start Time 1100    PT Stop Time 1140    PT Time Calculation (min) 40 min    Activity Tolerance Patient tolerated treatment well;No increased pain    Behavior During Therapy WFL for tasks assessed/performed              Past Medical History:  Diagnosis Date   COPD (chronic obstructive pulmonary disease) (HCC)    PONV (postoperative nausea and vomiting)    Past Surgical History:  Procedure Laterality Date   APPENDECTOMY     BIOPSY  05/27/2019   Procedure: BIOPSY;  Surgeon: Malissa Hippo, MD;  [facility];  Service: Endoscopy;;   COLONOSCOPY     ESOPHAGOGASTRODUODENOSCOPY N/A 05/27/2019   Procedure: ESOPHAGOGASTRODUODENOSCOPY (EGD);  Surgeon: Malissa Hippo, MD;  [facility];  Service: Endoscopy;  Laterality: N/A;  9:30   TUBAL LIGATION     Patient Active Problem List   Diagnosis Date Noted   Abdominal pain, epigastric 04/07/2019   Abnormal findings on diagnostic imaging of other abdominal regions, including retroperitoneum 04/07/2019    PCP: Nita Sells MD  REFERRING PROVIDER: Windle Guard, MD   REFERRING DIAG: M54.50 (ICD-10-CM) - Low back pain, unspecified   Rationale for Evaluation and Treatment: Rehabilitation  THERAPY DIAG:  Other low back pain  Difficulty in walking, not elsewhere classified  Muscle weakness (generalized)  Pain in left hip  ONSET DATE: exacerbation last few months  SUBJECTIVE:                                                                                                                                                                                            SUBJECTIVE STATEMENT: Pt increased discomfort with increased stress (husband with ca)  PERTINENT HISTORY:  COPD  PAIN:  Are you having pain? Yes: NPRS scale: current 3/10  Pain location: R hip  Pain description: ache Aggravating factors: walking > 10 min Relieving factors: meloxicam and gabapentin, resting  PRECAUTIONS: None  RED FLAGS: None   WEIGHT BEARING RESTRICTIONS: No  FALLS:  Has patient fallen in last 6 months?  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  DIAGNOSTIC FINDINGS:  Lumba\r MRI IMPRESSION: 1. At L3-4 there is a mild broad-based disc bulge with a left lateral annular fissure. Moderate bilateral facet arthropathy with ligamentum flavum infolding. Mild spinal stenosis. 2. At L4-5 there is a broad-based disc bulge with a right and left foraminal annular fissure. Severe bilateral facet arthropathy with ligamentum flavum infolding. Moderate spinal stenosis. 3. No acute osseous injury of the lumbar spine.  Hip x ray IMPRESSION: Mild degenerative changes in the right hip without loss of joint space.  PATIENT SURVEYS:  FOTO Primary score 39% with goal of 52%  COGNITION: Overall cognitive status: Within functional limits for tasks assessed     SENSATION: WFL  MUSCLE LENGTH: Hamstrings: tightness bilaterally, limited toleration of testing in sitting   POSTURE: rounded shoulders, forward head, and weight shift left  PALPATION: Moderate TTP throughout cervical to mid thoracic then lumbar spine paraspinals, traps, latissimus, glutes  LUMBAR ROM:   AROM eval  Flexion FT to below patellaP!  Extension  Limited 50%P!  Right lateral flexion Limited 90%P!  Left lateral flexion Limited 75%P!  Right rotation   Left rotation    (Blank rows = not tested)  LOWER EXTREMITY ROM:     WFL   LOWER EXTREMITY MMT:    MMT Right eval Left eval  Hip flexion 26.2 41.0  Hip extension    Hip abduction 12.6 18.7  Hip adduction    Hip internal rotation    Hip external rotation    Knee flexion    Knee extension 16.0 20.3  Ankle dorsiflexion    Ankle plantarflexion    Ankle inversion    Ankle eversion     (Blank rows = not tested)  LUMBAR SPECIAL TESTS:  Straight leg raise test: Positive rle and Slump test: Positive right  FUNCTIONAL TESTS:  5 times sit to stand: Not tolerated Timed up and go (TUG): 16.99 4 stage balance: Passed 1&2; tandem x 4s; SLS x6s  GAIT: Distance walked: 400 ft Assistive device utilized: None Level of assistance: Complete Independence Comments: off loading left, guarded posture, slowed cadence  TREATMENT  Pt seen for aquatic therapy today.  Treatment took place in water 3.5-4.75 ft in depth at the [facility] pool. Temp of water was 91.  Pt entered/exited the pool via stairs independently in step-to pattern with bilat rail.  - unsupported walking forward/ backward with cues for vertical trunk and reciprocal arm swingx 6 laps - farmer carry with rainbow hand floats under water, walking forward/ backwards x 2 laps -> single carry with yellow x 2 widths R/L - side stepping with arm addct/abdct with rainbow hand floats x 4 - Bow&Arrow demonstration and vc for execution x10 - Staggered stance with horiz abdct/ addct with rainbow hand floats x 8 each LE forward - TrA set with full hollow noodle pull downs to thighs in wide stance x 10 rep; staggered stance x 10 -STS from 3rd step from bottom x8 unsupported. Cues for slowed eccentric control. -seated on 3rd step from bottom: add set using orange BB x 5 - SLS ue support blue noodle ->unsupported 3.6 ft 20s  R/L with ease progressed to bottom step (good challenge)  - hip hinging x 8 with verbal and tactile cues.  Good execution.  Pt requires  the buoyancy and hydrostatic pressure of water for support, and to offload joints by unweighting joint load by at least 50 % in navel deep water and by at least 75-80% in chest to neck deep water.  Viscosity of the water is needed for resistance of strengthening. Water current perturbations provides challenge to standing balance requiring increased core activation.                                                                                                                             PATIENT EDUCATION:  Education details: intro to aquatic therapy  Person educated: Patient Education method: Explanation Education comprehension: verbalized understanding  HOME EXERCISE PROGRAM: 09/16/23: issued posture and body mechanics hand out  ASSESSMENT:  CLINICAL IMPRESSION: Improved posterior core muscle control and activation with returning from hinged position.  Improved SLS along with goal met.  She reports no increase in pain or fatigue with walking to and from setting and engaging in entire aquatic session as well as no residual discomfort or fatigue from activity. She does state she has increase in LB discomfort with very cold weather we have been having when out walking dogs but resolves with rest and warmth.  Progressing well.        From initial evaluation:  Patient is a 68 y.o. f who was seen today for physical therapy evaluation and treatment for LBP. She had a recent injection which improved her sciatic pain but LBP continues into right hip.  Main complaint is her inablility to walk her 2 small dogs on short path that includes a slight up hill area.  She also reports she seems to be getting weaker and she is afraid of falling.  She presents with deficits in LE strength, endurance, activity toleration and functional mobility with ADL's.  She will benefit  from skilled PT to improve all deficits and return pt to ability to walk dogs as well as safely care for sick husband.  OBJECTIVE IMPAIRMENTS: Abnormal gait, decreased activity tolerance, decreased balance, decreased endurance, decreased mobility, difficulty walking, decreased ROM, decreased strength, impaired flexibility, postural dysfunction, and pain.   ACTIVITY LIMITATIONS: carrying, lifting, sleeping, transfers, hygiene/grooming, caring for others, and walking dogs  PARTICIPATION LIMITATIONS: cleaning, shopping, community activity, and yard work  PERSONAL FACTORS: Age, Fitness, Past/current experiences, Time since onset of injury/illness/exacerbation, and 1-2 comorbidities: see PmHx  are also affecting patient's functional outcome.   REHAB POTENTIAL: Good  CLINICAL DECISION MAKING: Evolving/moderate complexity  EVALUATION COMPLEXITY: Moderate   GOALS: Goals reviewed with patient? Yes  SHORT TERM GOALS: Target date: 10/01/23  Pt will tolerate full aquatic sessions consistently without increase in pain and with improving function to demonstrate good toleration and effectiveness of intervention.  Baseline: Goal status: In progress - 09/23/23; MET 10/04/23  2.  Pt will have 25% improvement in lumbar ROM without increase in pain Baseline: see chart Goal status: INITIAL  3.  Pt will complete 10 consecutive STS transfers from water  bench onto water step without UE support or increase in pain Baseline:  Goal status: In progress complete once indep 09/25/23/ Met 10/04/23  4.  Pt will demonstrate improved toleration to activity by walking to and from setting, completing entire aquatic therapy session without reports of increased pain or fatigue Baseline:  Goal status:Met 10/04/23  5.  Pt will perform SLS and tandem stance holding x 15s submerged in 3.6 ft unsupported to demonstrate improvement in balance Baseline:  Goal status: In progress 09/25/23 completes with ue support; Met  10/04/23  6.  Pt will report not waking at night consistently due to pain. Baseline: nightly Goal status: In progress with help form gabapentin 09/25/23; Met 10/04/23 improved toleration to sleeping in sup  LONG TERM GOALS: Target date: 11/02/23  Pt will improve on Foto score by at least 8% points to reach MDC demonstrating improved perception of function Goal Baseline: 30% Goal status: INITIAL  2.  Pt will tolerate walking dogs usual path including uphill without reports of increased LBP. Baseline: extreme pain upon completion Goal status: INITIAL  3.  Pt will improve strength in all areas listed by at least 10 lbs to demonstrate improved overall physical function Baseline: see chart Goal status: INITIAL  4.  Pt will improve on Tug test to <or= 13s to demonstrate improvement in lower extremity function, mobility and decreased fall risk. Baseline: 16.99 Goal status: INITIAL  5.  Pt will report decrease in "normal pain" by at least 3 NPRS for improved toleration to activity Baseline: 5/10 Goal status: INITIAL  6.  Pt will be indep with final HEP's (land and aquatic as appropriate) for continued management of condition Baseline: none Goal status: INITIAL  PLAN:  PT FREQUENCY: 2x/week  PT DURATION: 8 weeks  PLANNED INTERVENTIONS: 97164- PT Re-evaluation, 97110-Therapeutic exercises, 97530- Therapeutic activity, 97112- Neuromuscular re-education, 97535- Self Care, 09604- Manual therapy, [phone]- Gait training, [phone]- Orthotic Fit/training, [phone]- Aquatic Therapy, [phone]- Electrical stimulation (unattended), Patient/Family education, Balance training, Stair training, Taping, Dry Needling, Joint mobilization, DME instructions, Cryotherapy, and Moist heat.  PLAN FOR NEXT SESSION: Plan aquatics and land: le and core strengthening, core stabilization, stretching program, activity toleration, balance, pain management. Land consider DN  Rushie Chestnut) Ellwood Steidle MPT 10/04/23 11:07 AM [facility] [address] [place], [place], [address] Phone: [phone]   Fax:  [phone]

## 2023-10-08 ENCOUNTER — Encounter (HOSPITAL_BASED_OUTPATIENT_CLINIC_OR_DEPARTMENT_OTHER): Payer: Self-pay | Admitting: Physical Therapy

## 2023-10-08 ENCOUNTER — Ambulatory Visit (HOSPITAL_BASED_OUTPATIENT_CLINIC_OR_DEPARTMENT_OTHER): Payer: Medicare HMO | Admitting: Physical Therapy

## 2023-10-08 DIAGNOSIS — R262 Difficulty in walking, not elsewhere classified: Secondary | ICD-10-CM

## 2023-10-08 DIAGNOSIS — M5459 Other low back pain: Secondary | ICD-10-CM

## 2023-10-08 DIAGNOSIS — M6281 Muscle weakness (generalized): Secondary | ICD-10-CM | POA: Diagnosis not present

## 2023-10-08 DIAGNOSIS — M25552 Pain in left hip: Secondary | ICD-10-CM

## 2023-10-08 NOTE — Therapy (Signed)
 OUTPATIENT PHYSICAL THERAPY THORACOLUMBAR TREATMENT   Patient Name: Monica Spence MRN: [id number] DOB:Oct 29, 1955, 68 y.o., female Today's Date: 10/08/2023  END OF SESSION:  PT End of Session - 10/08/23 1015     Visit Number 7    Number of Visits 16    Date for PT Re-Evaluation 11/02/23    Authorization Type [employer]    PT Start Time 1016    PT Stop Time 1055    PT Time Calculation (min) 39 min    Activity Tolerance Patient tolerated treatment well;No increased pain    Behavior During Therapy WFL for tasks assessed/performed              Past Medical History:  Diagnosis Date   COPD (chronic obstructive pulmonary disease) (HCC)    PONV (postoperative nausea and vomiting)    Past Surgical History:  Procedure Laterality Date   APPENDECTOMY     BIOPSY  05/27/2019   Procedure: BIOPSY;  Surgeon: Malissa Hippo, MD;  [facility];  Service: Endoscopy;;   COLONOSCOPY     ESOPHAGOGASTRODUODENOSCOPY N/A 05/27/2019   Procedure: ESOPHAGOGASTRODUODENOSCOPY (EGD);  Surgeon: Malissa Hippo, MD;  [facility];  Service: Endoscopy;  Laterality: N/A;  9:30   TUBAL LIGATION     Patient Active Problem List   Diagnosis Date Noted   Abdominal pain, epigastric 04/07/2019   Abnormal findings on diagnostic imaging of other abdominal regions, including retroperitoneum 04/07/2019    PCP: Nita Sells MD  REFERRING PROVIDER: Windle Guard, MD   REFERRING DIAG: M54.50 (ICD-10-CM) - Low back pain, unspecified   Rationale for Evaluation and Treatment: Rehabilitation  THERAPY DIAG:  Other low back pain  Difficulty in walking, not elsewhere classified  Muscle weakness (generalized)  Pain in left hip  ONSET DATE: exacerbation last few months  SUBJECTIVE:                                                                                                                                                                                            SUBJECTIVE STATEMENT: Pt reporting decreased LBP and right hip pain significantly but has had a rise in upper back pain, unable to tolerate sitting at restaurant for >30 minutes   PERTINENT HISTORY:  COPD  PAIN:  Are you having pain? Yes: NPRS scale: current 1/10   mid thoracic and cervical pain 5/10 Pain location: R hip  Pain description: ache Aggravating factors: walking > 10 min Relieving factors: meloxicam and gabapentin, resting  PRECAUTIONS: None  RED FLAGS: None   WEIGHT BEARING RESTRICTIONS: No  FALLS:  Has patient fallen in last  6 months?  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  DIAGNOSTIC FINDINGS:  Lumba\r MRI IMPRESSION: 1. At L3-4 there is a mild broad-based disc bulge with a left lateral annular fissure. Moderate bilateral facet arthropathy with ligamentum flavum infolding. Mild spinal stenosis. 2. At L4-5 there is a broad-based disc bulge with a right and left foraminal annular fissure. Severe bilateral facet arthropathy with ligamentum flavum infolding. Moderate spinal stenosis. 3. No acute osseous injury of the lumbar spine.  Hip x ray IMPRESSION: Mild degenerative changes in the right hip without loss of joint space.  PATIENT SURVEYS:  FOTO Primary score 39% with goal of 52% 10/08/23:55%  COGNITION: Overall cognitive status: Within functional limits for tasks assessed     SENSATION: WFL  MUSCLE LENGTH: Hamstrings: tightness bilaterally, limited toleration of testing in sitting   POSTURE: rounded shoulders, forward head, and weight shift left  PALPATION: Moderate TTP throughout cervical to mid  thoracic then lumbar spine paraspinals, traps, latissimus, glutes  LUMBAR ROM:   AROM eval  Flexion FT to below patellaP!  Extension Limited 50%P!  Right lateral flexion Limited 90%P!  Left lateral flexion Limited 75%P!  Right rotation   Left rotation    (Blank rows = not tested)  LOWER EXTREMITY ROM:     WFL   LOWER EXTREMITY MMT:    MMT Right eval Left eval  Hip flexion 26.2 41.0  Hip extension    Hip abduction 12.6 18.7  Hip adduction    Hip internal rotation    Hip external rotation    Knee flexion    Knee extension 16.0 20.3  Ankle dorsiflexion    Ankle plantarflexion    Ankle inversion    Ankle eversion     (Blank rows = not tested)  LUMBAR SPECIAL TESTS:  Straight leg raise test: Positive rle and Slump test: Positive right  FUNCTIONAL TESTS:  5 times sit to stand: Not tolerated Timed up and go (TUG): 16.99 4 stage balance: Passed 1&2; tandem x 4s; SLS x6s  GAIT: Distance walked: 400 ft Assistive device utilized: None Level of assistance: Complete Independence Comments: off loading left, guarded posture, slowed cadence  TREATMENT  Pt seen for aquatic therapy today.  Treatment took place in water 3.5-4.75 ft in depth at the [facility] pool. Temp of water was 91.  Pt entered/exited the pool via stairs independently in step-to pattern with bilat rail.  - unsupported walking forward/ backward and side stepping with cues for vertical trunk and reciprocal arm swing -ue horizontal add/abd using RB HB x 10 staggered stance - Bow&Arrow demonstration and vc for execution x10 - shoulder shrugging using RB HB x 10.  Cues for exaggerated shoulder depression for stretch -wall push ups x 10 - hip hinging x 8 with verbal and tactile cues.   - farmer carry with rainbow hand floats under water, walking forward/ backwards x 2 laps -> single carry with yellow x 2 widths R/L -STS from 3rd step from bottom x8 unsupported. (Improved control with eccentric  movement/improved executiion. Cues for glut isometric squeeze and upright posture. -decompression position with noodle wrapped posteriorly across chest:cycling; hip add/abd   Pt requires the buoyancy and hydrostatic  pressure of water for support, and to offload joints by unweighting joint load by at least 50 % in navel deep water and by at least 75-80% in chest to neck deep water.  Viscosity of the water is needed for resistance of strengthening. Water current perturbations provides challenge to standing balance requiring increased core activation.                                                                                                                             PATIENT EDUCATION:  Education details: intro to aquatic therapy  Person educated: Patient Education method: Explanation Education comprehension: verbalized understanding  HOME EXERCISE PROGRAM: 09/16/23: issued posture and body mechanics hand out  ASSESSMENT:  CLINICAL IMPRESSION: Pt with new complaint up mid thoracic and right shoulder/cervical pain. Reports having right shoulder pain in past and received at shot which improved pain.  She is planning on seeing MD in regards.  She states that her right hip and LB are much better.  She is able to tolerate walking her dogs without limitation in area.  New pain is now causing more limitations than old. Added strengthening and stretching of new areas along with progressing from last session with good toleration. Pt edu and demonstration upright posture     From initial evaluation:  Patient is a 68 y.o. f who was seen today for physical therapy evaluation and treatment for LBP. She had a recent injection which improved her sciatic pain but LBP continues into right hip.  Main complaint is her inablility to walk her 2 small dogs on short path that includes a slight up hill area.  She also reports she seems to be getting weaker and she is afraid of falling.  She presents with  deficits in LE strength, endurance, activity toleration and functional mobility with ADL's.  She will benefit from skilled PT to improve all deficits and return pt to ability to walk dogs as well as safely care for sick husband.  OBJECTIVE IMPAIRMENTS: Abnormal gait, decreased activity tolerance, decreased balance, decreased endurance, decreased mobility, difficulty walking, decreased ROM, decreased strength, impaired flexibility, postural dysfunction, and pain.   ACTIVITY LIMITATIONS: carrying, lifting, sleeping, transfers, hygiene/grooming, caring for others, and walking dogs  PARTICIPATION LIMITATIONS: cleaning, shopping, community activity, and yard work  PERSONAL FACTORS: Age, Fitness, Past/current experiences, Time since onset of injury/illness/exacerbation, and 1-2 comorbidities: see PmHx  are also affecting patient's functional outcome.   REHAB POTENTIAL: Good  CLINICAL DECISION MAKING: Evolving/moderate complexity  EVALUATION COMPLEXITY: Moderate   GOALS: Goals reviewed with patient? Yes  SHORT TERM GOALS: Target date: 10/01/23  Pt will tolerate full aquatic sessions consistently without increase in pain and with improving function to demonstrate good toleration and effectiveness of intervention.  Baseline: Goal status: In progress - 09/23/23; MET 10/04/23  2.  Pt will have 25% improvement in lumbar ROM without increase in pain Baseline: see chart Goal status: INITIAL  3.  Pt will complete 10 consecutive  STS transfers from water bench onto water step without UE support or increase in pain Baseline:  Goal status: In progress complete once indep 09/25/23/ Met 10/04/23  4.  Pt will demonstrate improved toleration to activity by walking to and from setting, completing entire aquatic therapy session without reports of increased pain or fatigue Baseline:  Goal status:Met 10/04/23  5.  Pt will perform SLS and tandem stance holding x 15s submerged in 3.6 ft unsupported to demonstrate  improvement in balance Baseline:  Goal status: In progress 09/25/23 completes with ue support; Met 10/04/23  6.  Pt will report not waking at night consistently due to pain. Baseline: nightly Goal status: In progress with help form gabapentin 09/25/23; Met 10/04/23 improved toleration to sleeping in sup  LONG TERM GOALS: Target date: 11/02/23  Pt will improve on Foto score by at least 8% points to reach MDC demonstrating improved perception of function Goal Baseline: 30% Goal status: Exceeded 10/08/23  2.  Pt will tolerate walking dogs usual path including uphill without reports of increased LBP. Baseline: extreme pain upon completion Goal status: INITIAL  3.  Pt will improve strength in all areas listed by at least 10 lbs to demonstrate improved overall physical function Baseline: see chart Goal status: INITIAL  4.  Pt will improve on Tug test to <or= 13s to demonstrate improvement in lower extremity function, mobility and decreased fall risk. Baseline: 16.99 Goal status: INITIAL  5.  Pt will report decrease in "normal pain" by at least 3 NPRS for improved toleration to activity Baseline: 5/10 Goal status: INITIAL  6.  Pt will be indep with final HEP's (land and aquatic as appropriate) for continued management of condition Baseline: none Goal status: INITIAL  PLAN:  PT FREQUENCY: 2x/week  PT DURATION: 8 weeks  PLANNED INTERVENTIONS: 97164- PT Re-evaluation, 97110-Therapeutic exercises, 97530- Therapeutic activity, 97112- Neuromuscular re-education, 97535- Self Care, 16109- Manual therapy, [phone]- Gait training, [phone]- Orthotic Fit/training, [phone]- Aquatic Therapy, [phone]- Electrical stimulation (unattended), Patient/Family education, Balance training, Stair training, Taping, Dry Needling, Joint mobilization, DME instructions, Cryotherapy, and Moist heat.  PLAN FOR NEXT SESSION: Plan aquatics and land: le and core strengthening, core stabilization, stretching program, activity  toleration, balance, pain management. Land consider DN  Rushie Chestnut) Silvia Markuson MPT 10/08/23 10:25 AM [facility] [address] [place], [place], [address] Phone: [phone]   Fax:  [phone]

## 2023-10-11 ENCOUNTER — Ambulatory Visit (HOSPITAL_BASED_OUTPATIENT_CLINIC_OR_DEPARTMENT_OTHER): Payer: Medicare HMO | Admitting: Physical Therapy

## 2023-10-11 ENCOUNTER — Encounter (HOSPITAL_BASED_OUTPATIENT_CLINIC_OR_DEPARTMENT_OTHER): Payer: Self-pay | Admitting: Physical Therapy

## 2023-10-11 DIAGNOSIS — R262 Difficulty in walking, not elsewhere classified: Secondary | ICD-10-CM

## 2023-10-11 DIAGNOSIS — M25552 Pain in left hip: Secondary | ICD-10-CM

## 2023-10-11 DIAGNOSIS — M5459 Other low back pain: Secondary | ICD-10-CM | POA: Diagnosis not present

## 2023-10-11 DIAGNOSIS — M6281 Muscle weakness (generalized): Secondary | ICD-10-CM | POA: Diagnosis not present

## 2023-10-11 NOTE — Therapy (Signed)
 OUTPATIENT PHYSICAL THERAPY THORACOLUMBAR TREATMENT   Patient Name: Monica Spence MRN: [id number] DOB:05-Jul-1956, 68 y.o., female Today's Date: 10/11/2023  END OF SESSION:  PT End of Session - 10/11/23 1101     Visit Number 8    Number of Visits 16    Date for PT Re-Evaluation 11/02/23    Authorization Type [employer]    PT Start Time 1102    PT Stop Time 1140    PT Time Calculation (min) 38 min    Activity Tolerance Patient tolerated treatment well;No increased pain    Behavior During Therapy WFL for tasks assessed/performed              Past Medical History:  Diagnosis Date   COPD (chronic obstructive pulmonary disease) (HCC)    PONV (postoperative nausea and vomiting)    Past Surgical History:  Procedure Laterality Date   APPENDECTOMY     BIOPSY  05/27/2019   Procedure: BIOPSY;  Surgeon: Malissa Hippo, MD;  [facility]: [facility];  Service: Endoscopy;;   COLONOSCOPY     ESOPHAGOGASTRODUODENOSCOPY N/A 05/27/2019   Procedure: ESOPHAGOGASTRODUODENOSCOPY (EGD);  Surgeon: Malissa Hippo, MD;  Location: [facility];  Service: Endoscopy;  Laterality: N/A;  9:30   TUBAL LIGATION     Patient Active Problem List   Diagnosis Date Noted   Abdominal pain, epigastric 04/07/2019   Abnormal findings on diagnostic imaging of other abdominal regions, including retroperitoneum 04/07/2019    PCP: Nita Sells MD  REFERRING PROVIDER: Windle Guard, MD   REFERRING DIAG: M54.50 (ICD-10-CM) - Low back pain, unspecified   Rationale for Evaluation and Treatment: Rehabilitation  THERAPY DIAG:  Other low back pain  Difficulty in walking, not elsewhere classified  Muscle weakness (generalized)  Pain in left hip  ONSET DATE: exacerbation last few months  SUBJECTIVE:                                                                                                                                                                                            SUBJECTIVE STATEMENT: Pt reporting decreased LBP seems to be getting better.  Has appt to see Dr Drucie Ip to address upper thoracic and cervical pain. "Have been taking gabapentin at night before bed to sleep"  PERTINENT HISTORY:  COPD  PAIN:  Are you having pain? Yes: NPRS scale: current 2/10   mid thoracic and cervical pain 5/10 Pain location: R hip  Pain description: ache Aggravating factors: walking > 10 min Relieving factors: meloxicam and gabapentin, resting  PRECAUTIONS: None  RED FLAGS: None   WEIGHT BEARING RESTRICTIONS: No  FALLS:  Has patient fallen in last 6 months?  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  DIAGNOSTIC FINDINGS:  Lumba\r MRI IMPRESSION: 1. At L3-4 there is a mild broad-based disc bulge with a left lateral annular fissure. Moderate bilateral facet arthropathy with ligamentum flavum infolding. Mild spinal stenosis. 2. At L4-5 there is a broad-based disc bulge with a right and left foraminal annular fissure. Severe bilateral facet arthropathy with ligamentum flavum infolding. Moderate spinal stenosis. 3. No acute osseous injury of the lumbar spine.  Hip x ray IMPRESSION: Mild degenerative changes in the right hip without loss of joint space.  PATIENT SURVEYS:  FOTO Primary score 39% with goal of 52% 10/08/23:55%  COGNITION: Overall cognitive status: Within functional limits for tasks assessed     SENSATION: WFL  MUSCLE LENGTH: Hamstrings: tightness bilaterally, limited toleration of testing in sitting   POSTURE: rounded shoulders, forward head, and weight shift left  PALPATION: Moderate  TTP throughout cervical to mid thoracic then lumbar spine paraspinals, traps, latissimus, glutes  LUMBAR ROM:   AROM eval 10/11/23  Flexion FT to below patellaP! Full No P! slight hs tightness  Extension Limited 50%P! full  Right lateral flexion Limited 90%P! full  Left lateral flexion Limited 75%P! full  Right rotation    Left rotation     (Blank rows = not tested)  LOWER EXTREMITY ROM:     WFL   LOWER EXTREMITY MMT:    MMT Right eval Left eval  Hip flexion 26.2 41.0  Hip extension    Hip abduction 12.6 18.7  Hip adduction    Hip internal rotation    Hip external rotation    Knee flexion    Knee extension 16.0 20.3  Ankle dorsiflexion    Ankle plantarflexion    Ankle inversion    Ankle eversion     (Blank rows = not tested)  LUMBAR SPECIAL TESTS:  Straight leg raise test: Positive rle and Slump test: Positive right  FUNCTIONAL TESTS:  5 times sit to stand: Not tolerated Timed up and go (TUG): 16.99 4 stage balance: Passed 1&2; tandem x 4s; SLS x6s  GAIT: Distance walked: 400 ft Assistive device utilized: None Level of assistance: Complete Independence Comments: off loading left, guarded posture, slowed cadence  TREATMENT  Pt seen for aquatic therapy today.  Treatment took place in water 3.5-4.75 ft in depth at the [facility] pool. Temp of water was 91.  Pt entered/exited the pool via stairs independently in step-to pattern with bilat rail.  - unsupported walking forward/ backward and side stepping with cues for vertical trunk and reciprocal arm swing -ue horizontal add/abd using RB HB x 10 staggered stance -isometric add set using orange BB 5 x 10s hold -STS from 3rd step from bottom x10 unsupported. Cues for glut isometric squeeze and upright posture. - STS with add set x 5. Completed with good control -wall push ups x 10 - horizontal add/abd using yellow HB staggered stance  x5 ea position - Bow&Arrow  x10 - hip hinging x 8 with verbal and  tactile cues.     -decompression position with noodle wrapped posteriorly across chest:cycling; hip add/abd   Pt requires the buoyancy  and hydrostatic pressure of water for support, and to offload joints by unweighting joint load by at least 50 % in navel deep water and by at least 75-80% in chest to neck deep water.  Viscosity of the water is needed for resistance of strengthening. Water current perturbations provides challenge to standing balance requiring increased core activation.                                                                                                                             PATIENT EDUCATION:  Education details: intro to aquatic therapy  Person educated: Patient Education method: Explanation Education comprehension: verbalized understanding  HOME EXERCISE PROGRAM: 09/16/23: issued posture and body mechanics hand out  ASSESSMENT:  CLINICAL IMPRESSION: Pt with improved lumbar ROM exceeding STG # 2.  All STG's met and she is progressing well toward LTG.  She is scheduled to begin transition to land based intervention which she is ready. She demonstrates very good toleration to progressing core engagement and balance today. She does complain of area [place] of LB creating pain which she has appt to address next week.  Consider DN land based in upper thoracic/cervical area as deemed approp. Progress loaded core and le strengthening. Instruct on posture and body mechanics as approp. She is planning on gaining membership to [employer] at end of services here.       From initial evaluation:  Patient is a 68 y.o. f who was seen today for physical therapy evaluation and treatment for LBP. She had a recent injection which improved her sciatic pain but LBP continues into right hip.  Main complaint is her inablility to walk her 2 small dogs on short path that includes a slight up hill area.  She also reports she seems to be getting weaker and she is afraid of  falling.  She presents with deficits in LE strength, endurance, activity toleration and functional mobility with ADL's.  She will benefit from skilled PT to improve all deficits and return pt to ability to walk dogs as well as safely care for sick husband.  OBJECTIVE IMPAIRMENTS: Abnormal gait, decreased activity tolerance, decreased balance, decreased endurance, decreased mobility, difficulty walking, decreased ROM, decreased strength, impaired flexibility, postural dysfunction, and pain.   ACTIVITY LIMITATIONS: carrying, lifting, sleeping, transfers, hygiene/grooming, caring for others, and walking dogs  PARTICIPATION LIMITATIONS: cleaning, shopping, community activity, and yard work  PERSONAL FACTORS: Age, Fitness, Past/current experiences, Time since onset of injury/illness/exacerbation, and 1-2 comorbidities: see PmHx  are also affecting patient's functional outcome.   REHAB POTENTIAL: Good  CLINICAL DECISION MAKING: Evolving/moderate complexity  EVALUATION COMPLEXITY: Moderate   GOALS: Goals reviewed with patient? Yes  SHORT TERM GOALS: Target date: 10/01/23  Pt will tolerate full aquatic sessions consistently without increase in pain and with improving function to demonstrate good toleration and effectiveness of intervention.  Baseline: Goal status: In progress - 09/23/23; MET 10/04/23  2.  Pt will have 25% improvement in lumbar ROM  without increase in pain Baseline: see chart Goal status: Met 10/11/23  3.  Pt will complete 10 consecutive STS transfers from water bench onto water step without UE support or increase in pain Baseline:  Goal status: In progress complete once indep 09/25/23/ Met 10/04/23  4.  Pt will demonstrate improved toleration to activity by walking to and from setting, completing entire aquatic therapy session without reports of increased pain or fatigue Baseline:  Goal status:Met 10/04/23  5.  Pt will perform SLS and tandem stance holding x 15s submerged in  3.6 ft unsupported to demonstrate improvement in balance Baseline:  Goal status: In progress 09/25/23 completes with ue support; Met 10/04/23  6.  Pt will report not waking at night consistently due to pain. Baseline: nightly Goal status: In progress with help form gabapentin 09/25/23; Met 10/04/23 improved toleration to sleeping in sup  LONG TERM GOALS: Target date: 11/02/23  Pt will improve on Foto score by at least 8% points to reach MDC demonstrating improved perception of function Goal Baseline: 30% Goal status: Exceeded 10/08/23  2.  Pt will tolerate walking dogs usual path including uphill without reports of increased LBP. Baseline: extreme pain upon completion Goal status: INITIAL  3.  Pt will improve strength in all areas listed by at least 10 lbs to demonstrate improved overall physical function Baseline: see chart Goal status: INITIAL  4.  Pt will improve on Tug test to <or= 13s to demonstrate improvement in lower extremity function, mobility and decreased fall risk. Baseline: 16.99 Goal status: INITIAL  5.  Pt will report decrease in "normal pain" by at least 3 NPRS for improved toleration to activity Baseline: 5/10 Goal status: INITIAL  6.  Pt will be indep with final HEP's (land and aquatic as appropriate) for continued management of condition Baseline: none Goal status: INITIAL  PLAN:  PT FREQUENCY: 2x/week  PT DURATION: 8 weeks  PLANNED INTERVENTIONS: 97164- PT Re-evaluation, 97110-Therapeutic exercises, 97530- Therapeutic activity, 97112- Neuromuscular re-education, 97535- Self Care, 54098- Manual therapy, [phone]- Gait training, [phone]- Orthotic Fit/training, [phone]- Aquatic Therapy, [phone]- Electrical stimulation (unattended), Patient/Family education, Balance training, Stair training, Taping, Dry Needling, Joint mobilization, DME instructions, Cryotherapy, and Moist heat.  PLAN FOR NEXT SESSION: Plan aquatics and land: le and core strengthening, core stabilization,  stretching program, activity toleration, balance, pain management. Land consider DN  Rushie Chestnut) Shian Goodnow MPT 10/11/23 11:04 AM [facility] [address] [place], [place], [address] Phone: [phone]   Fax:  [phone]

## 2023-10-15 ENCOUNTER — Ambulatory Visit (HOSPITAL_BASED_OUTPATIENT_CLINIC_OR_DEPARTMENT_OTHER): Payer: Medicare HMO | Attending: Anesthesiology | Admitting: Physical Therapy

## 2023-10-15 ENCOUNTER — Encounter (HOSPITAL_BASED_OUTPATIENT_CLINIC_OR_DEPARTMENT_OTHER): Payer: Self-pay | Admitting: Physical Therapy

## 2023-10-15 DIAGNOSIS — M6281 Muscle weakness (generalized): Secondary | ICD-10-CM | POA: Diagnosis not present

## 2023-10-15 DIAGNOSIS — R262 Difficulty in walking, not elsewhere classified: Secondary | ICD-10-CM | POA: Insufficient documentation

## 2023-10-15 DIAGNOSIS — M5459 Other low back pain: Secondary | ICD-10-CM | POA: Diagnosis not present

## 2023-10-15 DIAGNOSIS — M25552 Pain in left hip: Secondary | ICD-10-CM | POA: Insufficient documentation

## 2023-10-15 NOTE — Therapy (Unsigned)
 OUTPATIENT PHYSICAL THERAPY THORACOLUMBAR TREATMENT   Patient Name: Monica Spence MRN: [id number] DOB:January 21, 1956, 68 y.o., female Today's Date: 10/16/2023  END OF SESSION:  PT End of Session - 10/15/23 1110     Visit Number 9    Number of Visits 16    Date for PT Re-Evaluation 11/02/23    Authorization Type [employer]    PT Start Time 1103    PT Stop Time 1145    PT Time Calculation (min) 42 min    Activity Tolerance Patient tolerated treatment well;No increased pain    Behavior During Therapy WFL for tasks assessed/performed               Past Medical History:  Diagnosis Date   COPD (chronic obstructive pulmonary disease) (HCC)    PONV (postoperative nausea and vomiting)    Past Surgical History:  Procedure Laterality Date   APPENDECTOMY     BIOPSY  05/27/2019   Procedure: BIOPSY;  Surgeon: Malissa Hippo, MD;  Location: [facility];  Service: Endoscopy;;   COLONOSCOPY     ESOPHAGOGASTRODUODENOSCOPY N/A 05/27/2019   Procedure: ESOPHAGOGASTRODUODENOSCOPY (EGD);  Surgeon: Malissa Hippo, MD;  Location: [facility];  Service: Endoscopy;  Laterality: N/A;  9:30   TUBAL LIGATION     Patient Active Problem List   Diagnosis Date Noted   Abdominal pain, epigastric 04/07/2019   Abnormal findings on diagnostic imaging of other abdominal regions, including retroperitoneum 04/07/2019    PCP: Nita Sells MD  REFERRING PROVIDER: Windle Guard, MD   REFERRING DIAG: M54.50 (ICD-10-CM) - Low back pain, unspecified   Rationale for Evaluation and Treatment: Rehabilitation  THERAPY DIAG:  Other low back pain  Difficulty in walking, not elsewhere classified  Muscle weakness (generalized)  Pain in left hip  ONSET DATE: exacerbation last few months  SUBJECTIVE:                                                                                                                                                                                            SUBJECTIVE STATEMENT: The patient reports the pain is more in her mid back at this point  PERTINENT HISTORY:  COPD  PAIN:  Are you having pain? Yes: NPRS scale: current 2/10   mid thoracic and cervical pain 5/10 Pain location: R hip  Pain description: ache Aggravating factors: walking > 10 min Relieving factors: meloxicam and gabapentin, resting  PRECAUTIONS: None  RED FLAGS: None   WEIGHT BEARING RESTRICTIONS: No  FALLS:  Has patient fallen in last 6 months?  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  DIAGNOSTIC FINDINGS:  Lumba\r MRI IMPRESSION: 1. At L3-4 there is a mild broad-based disc bulge with a left lateral annular fissure. Moderate bilateral facet arthropathy with ligamentum flavum infolding. Mild spinal stenosis. 2. At L4-5 there is a broad-based disc bulge with a right and left foraminal annular fissure. Severe bilateral facet arthropathy with ligamentum flavum infolding. Moderate spinal stenosis. 3. No acute osseous injury of the lumbar spine.  Hip x ray IMPRESSION: Mild degenerative changes in the right hip without loss of joint space.  PATIENT SURVEYS:  FOTO Primary score 39% with goal of 52% 10/08/23:55%  COGNITION: Overall cognitive status: Within functional limits for tasks assessed     SENSATION: WFL  MUSCLE LENGTH: Hamstrings: tightness bilaterally, limited toleration of testing in sitting   POSTURE: rounded shoulders, forward head, and weight shift left  PALPATION: Moderate TTP throughout cervical to mid thoracic then lumbar spine paraspinals, traps, latissimus, glutes  LUMBAR ROM:   AROM  eval 10/11/23  Flexion FT to below patellaP! Full No P! slight hs tightness  Extension Limited 50%P! full  Right lateral flexion Limited 90%P! full  Left lateral flexion Limited 75%P! full  Right rotation    Left rotation     (Blank rows = not tested)  LOWER EXTREMITY ROM:     WFL   LOWER EXTREMITY MMT:    MMT Right eval Left eval  Hip flexion 26.2 41.0  Hip extension    Hip abduction 12.6 18.7  Hip adduction    Hip internal rotation    Hip external rotation    Knee flexion    Knee extension 16.0 20.3  Ankle dorsiflexion    Ankle plantarflexion    Ankle inversion    Ankle eversion     (Blank rows = not tested)  LUMBAR SPECIAL TESTS:  Straight leg raise test: Positive rle and Slump test: Positive right  FUNCTIONAL TESTS:  5 times sit to stand: Not tolerated Timed up and go (TUG): 16.99 4 stage balance: Passed 1&2; tandem x 4s; SLS x6s  GAIT: Distance walked: 400 ft Assistive device utilized: None Level of assistance: Complete Independence Comments: off loading left, guarded posture, slowed cadence  TREATMENT  3/5 Manual: skilled palpation of trigger points. Trigger point release to thoriacic spine. Grade I and II PA mobilizations to thoriacic spineReviewed self soft tissue mobilization    There-ex:  Ball roll x10 fwd 5 sec hold  5x lateral 5 second hold  Reviewed how to do at home with table   There-Act  Self Care  Nuro-Re-ed  Row with TA breathing 2x10 red  Shoulder extension with TA breathing yellow 2x10  Reviewed how to grade with RPE  Trigger Point Dry Needling  Initial Treatment: Pt instructed on Dry Needling rational, procedures, and possible side effects. Pt instructed to expect mild to moderate muscle soreness later in the day and/or into the next day.  Pt instructed in methods to reduce muscle soreness. Pt instructed to continue prescribed HEP. Because Dry Needling was performed over or adjacent to a lung field, pt was educated on S/S of  pneumothorax and to seek immediate medical attention should they occur.  Patient was educated on signs and symptoms of infection and  other risk factors and advised to seek medical attention should they occur.  Patient verbalized understanding of these instructions and education.   Patient Verbal Consent Given: Yes Education Handout Provided: Yes Muscles Treated: 2x bilateral to mid throacic paraspinals using a .30x50 needle  Electrical Stimulation Performed: No Treatment Response/Outcome: great twtich       Last visit; Pt seen for aquatic therapy today.  Treatment took place in water 3.5-4.75 ft in depth at the [facility] pool. Temp of water was 91.  Pt entered/exited the pool via stairs independently in step-to pattern with bilat rail.  - unsupported walking forward/ backward and side stepping with cues for vertical trunk and reciprocal arm swing -ue horizontal add/abd using RB HB x 10 staggered stance -isometric add set using orange BB 5 x 10s hold -STS from 3rd step from bottom x10 unsupported. Cues for glut isometric squeeze and upright posture. - STS with add set x 5. Completed with good control -wall push ups x 10 - horizontal add/abd using yellow HB staggered stance  x5 ea position - Bow&Arrow  x10 - hip hinging x 8 with verbal and tactile cues.     -decompression position with noodle wrapped posteriorly across chest:cycling; hip add/abd   Pt requires the buoyancy and hydrostatic pressure of water for support, and to offload joints by unweighting joint load by at least 50 % in navel deep water and by at least 75-80% in chest to neck deep water.  Viscosity of the water is needed for resistance of strengthening. Water current perturbations provides challenge to standing balance requiring increased core activation.                                                                                                                             PATIENT EDUCATION:   Education details: intro to aquatic therapy  Person educated: Patient Education method: Explanation Education comprehension: verbalized understanding  HOME EXERCISE PROGRAM: 09/16/23: issued posture and body mechanics hand out  Access Code: [email or web address] URL: [email or web address] Date: 10/16/2023 Prepared by: Lorayne Bender ASSESSMENT:  CLINICAL IMPRESSION: Therapy began developing patient on land program.  She has significant spasming in her bilateral mid thoracic paraspinals.  She reports this is the area that she has most of her pain.  Therapy perform manual therapy to this area.  She reported improved pain after.  Therapy showed her how to perform manual therapy on herself using a Thera cane.  We also discussed where to purchase a Thera cane.  We initiated posterior chain exercises.  We reviewed how to grade those exercises.  She is advised to keep them light at first.  We also advised how to advance those exercises.  She is given a red band as well as yellow band for home.  Therapy will continue to progress as tolerated.      From initial evaluation:  Patient is a 68 y.o. f who was seen today for physical therapy evaluation and treatment for  LBP. She had a recent injection which improved her sciatic pain but LBP continues into right hip.  Main complaint is her inablility to walk her 2 small dogs on short path that includes a slight up hill area.  She also reports she seems to be getting weaker and she is afraid of falling.  She presents with deficits in LE strength, endurance, activity toleration and functional mobility with ADL's.  She will benefit from skilled PT to improve all deficits and return pt to ability to walk dogs as well as safely care for sick husband.  OBJECTIVE IMPAIRMENTS: Abnormal gait, decreased activity tolerance, decreased balance, decreased endurance, decreased mobility, difficulty walking, decreased ROM, decreased strength, impaired flexibility, postural  dysfunction, and pain.   ACTIVITY LIMITATIONS: carrying, lifting, sleeping, transfers, hygiene/grooming, caring for others, and walking dogs  PARTICIPATION LIMITATIONS: cleaning, shopping, community activity, and yard work  PERSONAL FACTORS: Age, Fitness, Past/current experiences, Time since onset of injury/illness/exacerbation, and 1-2 comorbidities: see PmHx  are also affecting patient's functional outcome.   REHAB POTENTIAL: Good  CLINICAL DECISION MAKING: Evolving/moderate complexity  EVALUATION COMPLEXITY: Moderate   GOALS: Goals reviewed with patient? Yes  SHORT TERM GOALS: Target date: 10/01/23  Pt will tolerate full aquatic sessions consistently without increase in pain and with improving function to demonstrate good toleration and effectiveness of intervention.  Baseline: Goal status: In progress - 09/23/23; MET 10/04/23  2.  Pt will have 25% improvement in lumbar ROM without increase in pain Baseline: see chart Goal status: Met 10/11/23  3.  Pt will complete 10 consecutive STS transfers from water bench onto water step without UE support or increase in pain Baseline:  Goal status: In progress complete once indep 09/25/23/ Met 10/04/23  4.  Pt will demonstrate improved toleration to activity by walking to and from setting, completing entire aquatic therapy session without reports of increased pain or fatigue Baseline:  Goal status:Met 10/04/23  5.  Pt will perform SLS and tandem stance holding x 15s submerged in 3.6 ft unsupported to demonstrate improvement in balance Baseline:  Goal status: In progress 09/25/23 completes with ue support; Met 10/04/23  6.  Pt will report not waking at night consistently due to pain. Baseline: nightly Goal status: In progress with help form gabapentin 09/25/23; Met 10/04/23 improved toleration to sleeping in sup  LONG TERM GOALS: Target date: 11/02/23  Pt will improve on Foto score by at least 8% points to reach MDC demonstrating improved  perception of function Goal Baseline: 30% Goal status: Exceeded 10/08/23  2.  Pt will tolerate walking dogs usual path including uphill without reports of increased LBP. Baseline: extreme pain upon completion Goal status: INITIAL  3.  Pt will improve strength in all areas listed by at least 10 lbs to demonstrate improved overall physical function Baseline: see chart Goal status: INITIAL  4.  Pt will improve on Tug test to <or= 13s to demonstrate improvement in lower extremity function, mobility and decreased fall risk. Baseline: 16.99 Goal status: INITIAL  5.  Pt will report decrease in "normal pain" by at least 3 NPRS for improved toleration to activity Baseline: 5/10 Goal status: INITIAL  6.  Pt will be indep with final HEP's (land and aquatic as appropriate) for continued management of condition Baseline: none Goal status: INITIAL  PLAN:  PT FREQUENCY: 2x/week  PT DURATION: 8 weeks  PLANNED INTERVENTIONS: 97164- PT Re-evaluation, 97110-Therapeutic exercises, 97530- Therapeutic activity, [id number]- Neuromuscular re-education, 97535- Self Care, 16109- Manual therapy, [id number]- Gait training, [phone]- Orthotic  Fit/training, 08657- Aquatic Therapy, [phone]- Electrical stimulation (unattended), Patient/Family education, Balance training, Stair training, Taping, Dry Needling, Joint mobilization, DME instructions, Cryotherapy, and Moist heat.  PLAN FOR NEXT SESSION: Plan aquatics and land: le and core strengthening, core stabilization, stretching program, activity toleration, balance, pain management. Land consider DN  Rushie Chestnut) Ziemba MPT 10/16/23 8:36 AM [facility] [address] [place], [place], [address] Phone: [phone]   Fax:  [phone]

## 2023-10-16 ENCOUNTER — Encounter (HOSPITAL_BASED_OUTPATIENT_CLINIC_OR_DEPARTMENT_OTHER): Payer: Self-pay | Admitting: Physical Therapy

## 2023-10-18 ENCOUNTER — Encounter (HOSPITAL_BASED_OUTPATIENT_CLINIC_OR_DEPARTMENT_OTHER): Payer: Self-pay | Admitting: Physical Therapy

## 2023-10-18 ENCOUNTER — Ambulatory Visit (HOSPITAL_BASED_OUTPATIENT_CLINIC_OR_DEPARTMENT_OTHER): Payer: PPO | Admitting: Physical Therapy

## 2023-10-18 DIAGNOSIS — M25552 Pain in left hip: Secondary | ICD-10-CM

## 2023-10-18 DIAGNOSIS — R262 Difficulty in walking, not elsewhere classified: Secondary | ICD-10-CM | POA: Diagnosis not present

## 2023-10-18 DIAGNOSIS — M6281 Muscle weakness (generalized): Secondary | ICD-10-CM | POA: Diagnosis not present

## 2023-10-18 DIAGNOSIS — M5459 Other low back pain: Secondary | ICD-10-CM

## 2023-10-18 NOTE — Therapy (Signed)
 OUTPATIENT PHYSICAL THERAPY THORACOLUMBAR TREATMENT Progress Note Reporting Period 09/03/23 to 10/18/23  See note below for Objective Data and Assessment of Progress/Goals.      Patient Name: Monica Spence MRN: [id number] DOB:[birth year], 68 y.o., female Today's Date: 10/18/2023  END OF SESSION:  PT End of Session - 10/18/23 1057     Visit Number 10    Number of Visits 16    Date for PT Re-Evaluation 11/02/23    Authorization Type [employer]    PT Start Time 1100    PT Stop Time 1140    PT Time Calculation (min) 40 min    Activity Tolerance Patient tolerated treatment well;No increased pain    Behavior During Therapy WFL for tasks assessed/performed               Past Medical History:  Diagnosis Date   COPD (chronic obstructive pulmonary disease) (HCC)    PONV (postoperative nausea and vomiting)    Past Surgical History:  Procedure Laterality Date   APPENDECTOMY     BIOPSY  05/27/2019   Procedure: BIOPSY;  Surgeon: Malissa Hippo, MD;  Location: [facility];  Service: Endoscopy;;   COLONOSCOPY     ESOPHAGOGASTRODUODENOSCOPY N/A 05/27/2019   Procedure: ESOPHAGOGASTRODUODENOSCOPY (EGD);  Surgeon: Malissa Hippo, MD;  Location: [facility];  Service: Endoscopy;  Laterality: N/A;  9:30   TUBAL LIGATION     Patient Active Problem List   Diagnosis Date Noted   Abdominal pain, epigastric 04/07/2019   Abnormal findings on diagnostic imaging of other abdominal regions, including retroperitoneum 04/07/2019    PCP: Nita Sells MD  REFERRING PROVIDER: Windle Guard, MD   REFERRING DIAG: M54.50 (ICD-10-CM) - Low back pain, unspecified   Rationale for Evaluation and Treatment: Rehabilitation  THERAPY DIAG:  Other low back pain  Difficulty in walking, not elsewhere classified  Muscle weakness (generalized)  Pain in left hip  ONSET DATE: exacerbation last few months  SUBJECTIVE:                                                                                                                                                                                            SUBJECTIVE STATEMENT: The patient reports improvement in cervical and trap/shoulder pain since DN and manual by about 25%  PERTINENT HISTORY:  COPD  PAIN:  Are you having pain? Yes: NPRS scale: current 2/10   mid thoracic and cervical pain 5/10 Pain location: R hip  Pain description: ache Aggravating factors: walking > 10 min Relieving factors: meloxicam and gabapentin, resting  PRECAUTIONS: None  RED FLAGS: None   WEIGHT BEARING  RESTRICTIONS: No  FALLS:  Has patient fallen in last 6 months?  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  DIAGNOSTIC FINDINGS:  Lumba\r MRI IMPRESSION: 1. At L3-4 there is a mild broad-based disc bulge with a left lateral annular fissure. Moderate bilateral facet arthropathy with ligamentum flavum infolding. Mild spinal stenosis. 2. At L4-5 there is a broad-based disc bulge with a right and left foraminal annular fissure. Severe bilateral facet arthropathy with ligamentum flavum infolding. Moderate spinal stenosis. 3. No acute osseous injury of the lumbar spine.  Hip x ray IMPRESSION: Mild degenerative changes in the right hip without loss of joint space.  PATIENT SURVEYS:  FOTO Primary score 39% with goal of 52% 10/08/23:55%  COGNITION: Overall cognitive status: Within functional limits for tasks assessed     SENSATION: WFL  MUSCLE LENGTH: Hamstrings: tightness bilaterally, limited toleration of testing in sitting   POSTURE: rounded shoulders, forward head, and weight  shift left  PALPATION: Moderate TTP throughout cervical to mid thoracic then lumbar spine paraspinals, traps, latissimus, glutes  LUMBAR ROM:   AROM eval 10/11/23  Flexion FT to below patellaP! Full No P! slight hs tightness  Extension Limited 50%P! full  Right lateral flexion Limited 90%P! full  Left lateral flexion Limited 75%P! full  Right rotation    Left rotation     (Blank rows = not tested)  LOWER EXTREMITY ROM:     WFL   LOWER EXTREMITY MMT:    MMT Right eval Left eval R / L 10/18/23  Hip flexion 26.2 41.0 32.3 / 50.8  Hip extension     Hip abduction 12.6 18.7 17.2 / 20.2  Hip adduction     Hip internal rotation     Hip external rotation     Knee flexion     Knee extension 16.0 20.3 20.7 / 26.0  Ankle dorsiflexion     Ankle plantarflexion     Ankle inversion     Ankle eversion      (Blank rows = not tested)  LUMBAR SPECIAL TESTS:  Straight leg raise test: Positive rle and Slump test: Positive right 10/18/23: Slump test neg right  FUNCTIONAL TESTS:  5 times sit to stand: Not tolerated Timed up and go (TUG): 16.99 4 stage balance: Passed 1&2; tandem x 4s; SLS x6s  10/18/23: TUG 13.67  GAIT: Distance walked: 400 ft Assistive device utilized: None Level of assistance: Complete Independence Comments: off loading left, guarded posture, slowed cadence  TREATMENT  10/18/23  Testing  Pt seen for aquatic therapy today.  Treatment took place in water 3.5-4.75 ft in depth at the [facility] pool. Temp of water was 91.  Pt entered/exited the pool via stairs independently in step-to pattern with bilat rail.  - unsupported walking forward/ backward and side stepping with cues for vertical trunk and reciprocal arm swingforward, back and side stepping *farmers walk with yellow HB *hip hinging x 10 -ue horizontal add/abd using yellow HB x 10 staggered stance -bow and arrow -isometric add set using orange BB 5 x 10s hold -STS from 3rd step from bottom x10  unsupported. Cues for glut isometric squeeze and upright posture. -wall push ups x 10 -decompression  position with noodle wrapped posteriorly across chest:cycling; hip add/abd   Pt requires the buoyancy and hydrostatic pressure of water for support, and to offload joints by unweighting joint load by at least 50 % in navel deep water and by at least 75-80% in chest to neck deep water.  Viscosity of the water is needed for resistance of strengthening. Water current perturbations provides challenge to standing balance requiring increased core activation.   Last: 3/5 Manual: skilled palpation of trigger points. Trigger point release to thoriacic spine. Grade I and II PA mobilizations to thoriacic spineReviewed self soft tissue mobilization    There-ex:  Ball roll x10 fwd 5 sec hold  5x lateral 5 second hold  Reviewed how to do at home with table   There-Act  Self Care  Nuro-Re-ed  Row with TA breathing 2x10 red  Shoulder extension with TA breathing yellow 2x10  Reviewed how to grade with RPE  Trigger Point Dry Needling  Initial Treatment: Pt instructed on Dry Needling rational, procedures, and possible side effects. Pt instructed to expect mild to moderate muscle soreness later in the day and/or into the next day.  Pt instructed in methods to reduce muscle soreness. Pt instructed to continue prescribed HEP. Because Dry Needling was performed over or adjacent to a lung field, pt was educated on S/S of pneumothorax and to seek immediate medical attention should they occur.  Patient was educated on signs and symptoms of infection and other risk factors and advised to seek medical attention should they occur.  Patient verbalized understanding of these instructions and education.   Patient Verbal Consent Given: Yes Education Handout Provided: Yes Muscles Treated: 2x bilateral to mid throacic paraspinals using a .30x50 needle  Electrical Stimulation Performed: No Treatment  Response/Outcome: great twtich                                                                                                                   PATIENT EDUCATION:  Education details: intro to aquatic therapy  Person educated: Patient Education method: Explanation Education comprehension: verbalized understanding  HOME EXERCISE PROGRAM: 09/16/23: issued posture and body mechanics hand out  Access Code: [email or web address] URL: [email or web address] Date: 10/16/2023 Prepared by: Lorayne Bender  Access Code: [email or web address] URL: [email or web address] Date: 10/18/2023 Prepared by: Geni Bers  Exercises - Noodle press  - 1 x daily - 7 x weekly - 3 sets - 10 reps - Standing Shoulder Horizontal Abduction with Resistance  - 1 x daily - 7 x weekly - 3 sets - 10 reps - Drawing Bow  - 1 x daily - 7 x weekly - 3 sets - 10 reps - Wall Push Up  - 1 x daily - 7 x weekly - 3 sets - 10 reps - Standing Hip Hinge  - 1 x daily - 7 x weekly - 3 sets - 10 reps - Seated Straddle on Flotation Forward Breast Stroke Arms and Bicycle Legs  - 1 x daily - 7 x weekly - 3 sets - 10 reps ASSESSMENT:  CLINICAL IMPRESSION: PN: Pt demonstrates progress in all functional and objective testing as noted in above charts.  She has had a good reduction in LBP (Normal pain lb 2/10 LB) and reports her ability to tolerate amb and walking dog has improved.  She does report a fairly new discomfort in her upper thoracic and shoulder area which has been address x 1 by land based therapist with positive results.  She does report that that newer pain does increase with her walking the dogs. Lumbar ROM has been fully restored. Plan to extend cert out 6 weeks for continued land based intervention to be completed at the end of this certification. She will be seen x 1 more in aquatics for issuance and instruction on final HEP.      From initial evaluation:  Patient is a 67 y.o. f who was seen today for physical therapy  evaluation and treatment for LBP. She had a recent injection which improved her sciatic pain but LBP continues into right hip.  Main complaint is her inablility to walk her 2 small dogs on short path that includes a slight up hill area.  She also reports she seems to be getting weaker and she is afraid of falling.  She presents with deficits in LE strength, endurance, activity toleration and functional mobility with ADL's.  She will benefit from skilled PT to improve all deficits and return pt to ability to walk dogs as well as safely care for sick husband.  OBJECTIVE IMPAIRMENTS: Abnormal gait, decreased activity tolerance, decreased balance, decreased endurance, decreased mobility, difficulty walking, decreased ROM, decreased strength, impaired flexibility, postural dysfunction, and pain.   ACTIVITY LIMITATIONS: carrying, lifting, sleeping, transfers, hygiene/grooming, caring for others, and walking dogs  PARTICIPATION LIMITATIONS: cleaning, shopping, community activity, and yard work  PERSONAL FACTORS: Age, Fitness, Past/current experiences, Time since onset of injury/illness/exacerbation, and 1-2 comorbidities: see PmHx  are also affecting patient's functional outcome.   REHAB POTENTIAL: Good  CLINICAL DECISION MAKING: Evolving/moderate complexity  EVALUATION COMPLEXITY: Moderate   GOALS: Goals reviewed with patient? Yes  SHORT TERM GOALS: Target date: 10/01/23  Pt will tolerate full aquatic sessions consistently without increase in pain and with improving function to demonstrate good toleration and effectiveness of intervention.  Baseline: Goal status: In progress - 09/23/23; MET 10/04/23  2.  Pt will have 25% improvement in lumbar ROM without increase in pain Baseline: see chart Goal status: Met 10/11/23  3.  Pt will complete 10 consecutive STS transfers from water bench onto water step without UE support or increase in pain Baseline:  Goal status: In progress complete once indep  09/25/23/ Met 10/04/23  4.  Pt will demonstrate improved toleration to activity by walking to and from setting, completing entire aquatic therapy session without reports of increased pain or fatigue Baseline:  Goal status:Met 10/04/23  5.  Pt will perform SLS and tandem stance holding x 15s submerged in 3.6 ft unsupported to demonstrate improvement in balance Baseline:  Goal status: In progress 09/25/23 completes with ue support; Met 10/04/23  6.  Pt will report not waking at night consistently due to pain. Baseline: nightly Goal status: In progress with help form gabapentin 09/25/23; Met 10/04/23 improved toleration to sleeping in sup  LONG TERM GOALS: Target date: 11/02/23  Pt will improve on Foto score by at least 8% points to reach MDC demonstrating improved perception of function Goal Baseline: 30% Goal status: Exceeded 10/08/23  2.  Pt will tolerate walking dogs usual path including uphill without  reports of increased LBP. Baseline: extreme pain upon completion Goal status: Met 10/18/23  3.  Pt will improve strength in all areas listed by at least 10 lbs to demonstrate improved overall physical function Baseline: see chart Goal status: in Progress 10/18/23  4.  Pt will improve on Tug test to <or= 13s to demonstrate improvement in lower extremity function, mobility and decreased fall risk. Baseline: 16.99 Goal status: In progress 10/18/23  5.  Pt will report decrease in "normal pain" by at least 3 NPRS for improved toleration to activity Baseline: 5/10 Goal status: Met for LB 10/18/23  6.  Pt will be indep with final HEP's (land and aquatic as appropriate) for continued management of condition Baseline: none Goal status: INITIAL  PLAN:  PT FREQUENCY: 2x/week  PT DURATION: 8 weeks  PLANNED INTERVENTIONS: 97164- PT Re-evaluation, 97110-Therapeutic exercises, 97530- Therapeutic activity, 97112- Neuromuscular re-education, 97535- Self Care, 95621- Manual therapy, [phone]- Gait training,  [phone]- Orthotic Fit/training, [phone]- Aquatic Therapy, [phone]- Electrical stimulation (unattended), Patient/Family education, Balance training, Stair training, Taping, Dry Needling, Joint mobilization, DME instructions, Cryotherapy, and Moist heat.  PLAN FOR NEXT SESSION: Plan aquatics and land: 1 more session for HEP. Land based for continued progression toward goals. Land consider DN  Rushie Chestnut) Laxmi Choung MPT 10/18/23 11:27 AM [facility] [address] [place], [place], [address] Phone: [phone]   Fax:  [phone]

## 2023-10-22 ENCOUNTER — Encounter (HOSPITAL_BASED_OUTPATIENT_CLINIC_OR_DEPARTMENT_OTHER): Payer: Self-pay | Admitting: Physical Therapy

## 2023-10-22 ENCOUNTER — Ambulatory Visit (HOSPITAL_BASED_OUTPATIENT_CLINIC_OR_DEPARTMENT_OTHER): Payer: PPO | Admitting: Physical Therapy

## 2023-10-22 DIAGNOSIS — M542 Cervicalgia: Secondary | ICD-10-CM | POA: Diagnosis not present

## 2023-10-22 DIAGNOSIS — M6281 Muscle weakness (generalized): Secondary | ICD-10-CM

## 2023-10-22 DIAGNOSIS — M5416 Radiculopathy, lumbar region: Secondary | ICD-10-CM | POA: Diagnosis not present

## 2023-10-22 DIAGNOSIS — M5459 Other low back pain: Secondary | ICD-10-CM

## 2023-10-22 DIAGNOSIS — R262 Difficulty in walking, not elsewhere classified: Secondary | ICD-10-CM | POA: Diagnosis not present

## 2023-10-22 DIAGNOSIS — M25552 Pain in left hip: Secondary | ICD-10-CM | POA: Diagnosis not present

## 2023-10-22 DIAGNOSIS — M7918 Myalgia, other site: Secondary | ICD-10-CM | POA: Diagnosis not present

## 2023-10-22 NOTE — Therapy (Signed)
 OUTPATIENT PHYSICAL THERAPY THORACOLUMBAR TREATMENT Progress Note Reporting Period 09/03/23 to 10/18/23  See note below for Objective Data and Assessment of Progress/Goals.      Patient Name: Monica Spence MRN: [id number] DOB:26-Feb-1956, 68 y.o., female Today's Date: 10/22/2023  END OF SESSION:  PT End of Session - 10/22/23 1222     Visit Number 11    Number of Visits 16    Date for PT Re-Evaluation 11/02/23    Authorization Type [employer]    PT Start Time 1100    PT Stop Time 1143    PT Time Calculation (min) 43 min    Activity Tolerance Patient tolerated treatment well;No increased pain    Behavior During Therapy WFL for tasks assessed/performed                Past Medical History:  Diagnosis Date   COPD (chronic obstructive pulmonary disease) (HCC)    PONV (postoperative nausea and vomiting)    Past Surgical History:  Procedure Laterality Date   APPENDECTOMY     BIOPSY  05/27/2019   Procedure: BIOPSY;  Surgeon: Malissa Hippo, MD;  Location: [facility];  Service: Endoscopy;;   COLONOSCOPY     ESOPHAGOGASTRODUODENOSCOPY N/A 05/27/2019   Procedure: ESOPHAGOGASTRODUODENOSCOPY (EGD);  Surgeon: Malissa Hippo, MD;  Location: [facility];  Service: Endoscopy;  Laterality: N/A;  9:30   TUBAL LIGATION     Patient Active Problem List   Diagnosis Date Noted   Abdominal pain, epigastric 04/07/2019   Abnormal findings on diagnostic imaging of other abdominal regions, including retroperitoneum 04/07/2019    PCP: Nita Sells MD  REFERRING PROVIDER: Windle Guard, MD   REFERRING DIAG: M54.50 (ICD-10-CM) - Low back pain, unspecified   Rationale for Evaluation and Treatment: Rehabilitation  THERAPY DIAG:  Other low back pain  Difficulty in walking, not elsewhere classified  Muscle weakness (generalized)  Pain in left hip  ONSET DATE: exacerbation last few months  SUBJECTIVE:                                                                                                                                                                                            SUBJECTIVE STATEMENT: The patient reports improvement in cervical and trap/shoulder pain since DN and manual by about 25%  PERTINENT HISTORY:  COPD  PAIN:  Are you having pain? Yes: NPRS scale: current 2/10   mid thoracic and cervical pain 5/10 Pain location: R hip  Pain description: ache Aggravating factors: walking > 10 min Relieving factors: meloxicam and gabapentin, resting  PRECAUTIONS: None  RED FLAGS: None   WEIGHT  BEARING RESTRICTIONS: No  FALLS:  Has patient fallen in last 6 months?  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  DIAGNOSTIC FINDINGS:  Lumba\r MRI IMPRESSION: 1. At L3-4 there is a mild broad-based disc bulge with a left lateral annular fissure. Moderate bilateral facet arthropathy with ligamentum flavum infolding. Mild spinal stenosis. 2. At L4-5 there is a broad-based disc bulge with a right and left foraminal annular fissure. Severe bilateral facet arthropathy with ligamentum flavum infolding. Moderate spinal stenosis. 3. No acute osseous injury of the lumbar spine.  Hip x ray IMPRESSION: Mild degenerative changes in the right hip without loss of joint space.  PATIENT SURVEYS:  FOTO Primary score 39% with goal of 52% 10/08/23:55%  COGNITION: Overall cognitive status: Within functional limits for tasks assessed     SENSATION: WFL  MUSCLE LENGTH: Hamstrings: tightness bilaterally, limited toleration of testing in sitting   POSTURE: rounded shoulders, forward head, and weight  shift left  PALPATION: Moderate TTP throughout cervical to mid thoracic then lumbar spine paraspinals, traps, latissimus, glutes  LUMBAR ROM:   AROM eval 10/11/23  Flexion FT to below patellaP! Full No P! slight hs tightness  Extension Limited 50%P! full  Right lateral flexion Limited 90%P! full  Left lateral flexion Limited 75%P! full  Right rotation    Left rotation     (Blank rows = not tested)  LOWER EXTREMITY ROM:     WFL   LOWER EXTREMITY MMT:    MMT Right eval Left eval R / L 10/18/23  Hip flexion 26.2 41.0 32.3 / 50.8  Hip extension     Hip abduction 12.6 18.7 17.2 / 20.2  Hip adduction     Hip internal rotation     Hip external rotation     Knee flexion     Knee extension 16.0 20.3 20.7 / 26.0  Ankle dorsiflexion     Ankle plantarflexion     Ankle inversion     Ankle eversion      (Blank rows = not tested)  LUMBAR SPECIAL TESTS:  Straight leg raise test: Positive rle and Slump test: Positive right 10/18/23: Slump test neg right  FUNCTIONAL TESTS:  5 times sit to stand: Not tolerated Timed up and go (TUG): 16.99 4 stage balance: Passed 1&2; tandem x 4s; SLS x6s  10/18/23: TUG 13.67  GAIT: Distance walked: 400 ft Assistive device utilized: None Level of assistance: Complete Independence Comments: off loading left, guarded posture, slowed cadence  TREATMENT  3/11 Manual: skilled palpation of trigger points. Trigger point release to thoriacic spine. Grade I and II PA mobilizations to thoriacic spineReviewed self soft tissue mobilization    10/18/23  Testing  Pt seen for aquatic therapy today.  Treatment took place in water 3.5-4.75 ft in depth at the [facility] pool. Temp of water was 91.  Pt entered/exited the pool via stairs independently in step-to pattern with bilat rail.  - unsupported walking forward/ backward and side stepping with cues for vertical trunk and reciprocal arm swingforward, back and side stepping *farmers walk with  yellow HB *hip hinging x 10 -ue horizontal add/abd using yellow HB x 10 staggered stance -bow and arrow -isometric add  set using orange BB 5 x 10s hold -STS from 3rd step from bottom x10 unsupported. Cues for glut isometric squeeze and upright posture. -wall push ups x 10 -decompression position with noodle wrapped posteriorly across chest:cycling; hip add/abd   Pt requires the buoyancy and hydrostatic pressure of water for support, and to offload joints by unweighting joint load by at least 50 % in navel deep water and by at least 75-80% in chest to neck deep water.  Viscosity of the water is needed for resistance of strengthening. Water current perturbations provides challenge to standing balance requiring increased core activation.   Last:    3/5 Manual: skilled palpation of trigger points. Trigger point release to thoriacic spine. Grade I and II PA mobilizations to thoriacic spineReviewed self soft tissue mobilization    There-ex:  Ball roll x10 fwd 5 sec hold  5x lateral 5 second hold  Reviewed how to do at home with table   There-Act  Self Care  Nuro-Re-ed  Row with TA breathing 2x10 red  Shoulder extension with TA breathing yellow 2x10  Reviewed how to grade with RPE  Trigger Point Dry Needling  Initial Treatment: Pt instructed on Dry Needling rational, procedures, and possible side effects. Pt instructed to expect mild to moderate muscle soreness later in the day and/or into the next day.  Pt instructed in methods to reduce muscle soreness. Pt instructed to continue prescribed HEP. Because Dry Needling was performed over or adjacent to a lung field, pt was educated on S/S of pneumothorax and to seek immediate medical attention should they occur.  Patient was educated on signs and symptoms of infection and other risk factors and advised to seek medical attention should they occur.  Patient verbalized understanding of these instructions and education.   Patient  Verbal Consent Given: Yes Education Handout Provided: Yes Muscles Treated: 2x bilateral to mid throacic paraspinals using a .30x50 needle  Electrical Stimulation Performed: No Treatment Response/Outcome: great twtich                                                                                                                   PATIENT EDUCATION:  Education details: intro to aquatic therapy  Person educated: Patient Education method: Explanation Education comprehension: verbalized understanding  HOME EXERCISE PROGRAM: 09/16/23: issued posture and body mechanics hand out  Access Code: [email or web address] URL: [email or web address] Date: 10/16/2023 Prepared by: Lorayne Bender  Access Code: [email or web address] URL: [email or web address] Date: 10/18/2023 Prepared by: Geni Bers  Exercises - Noodle press  - 1 x daily - 7 x weekly - 3 sets - 10 reps - Standing Shoulder Horizontal Abduction with Resistance  - 1 x daily - 7 x weekly - 3 sets - 10 reps - Drawing Bow  - 1 x daily - 7 x weekly - 3 sets - 10 reps - Wall Push Up  - 1 x daily - 7 x weekly - 3 sets - 10 reps - Standing Hip Hinge  - 1 x daily - 7 x weekly -  3 sets - 10 reps - Seated Straddle on Flotation Forward Breast Stroke Arms and Bicycle Legs  - 1 x daily - 7 x weekly - 3 sets - 10 reps ASSESSMENT:  CLINICAL IMPRESSION: PN: Pt demonstrates progress in all functional and objective testing as noted in above charts.  She has had a good reduction in LBP (Normal pain lb 2/10 LB) and reports her ability to tolerate amb and walking dog has improved.  She does report a fairly new discomfort in her upper thoracic and shoulder area which has been address x 1 by land based therapist with positive results.  She does report that that newer pain does increase with her walking the dogs. Lumbar ROM has been fully restored. Plan to extend cert out 6 weeks for continued land based intervention to be completed at the end of this  certification. She will be seen x 1 more in aquatics for issuance and instruction on final HEP.      From initial evaluation:  Patient is a 68 y.o. f who was seen today for physical therapy evaluation and treatment for LBP. She had a recent injection which improved her sciatic pain but LBP continues into right hip.  Main complaint is her inablility to walk her 2 small dogs on short path that includes a slight up hill area.  She also reports she seems to be getting weaker and she is afraid of falling.  She presents with deficits in LE strength, endurance, activity toleration and functional mobility with ADL's.  She will benefit from skilled PT to improve all deficits and return pt to ability to walk dogs as well as safely care for sick husband.  OBJECTIVE IMPAIRMENTS: Abnormal gait, decreased activity tolerance, decreased balance, decreased endurance, decreased mobility, difficulty walking, decreased ROM, decreased strength, impaired flexibility, postural dysfunction, and pain.   ACTIVITY LIMITATIONS: carrying, lifting, sleeping, transfers, hygiene/grooming, caring for others, and walking dogs  PARTICIPATION LIMITATIONS: cleaning, shopping, community activity, and yard work  PERSONAL FACTORS: Age, Fitness, Past/current experiences, Time since onset of injury/illness/exacerbation, and 1-2 comorbidities: see PmHx  are also affecting patient's functional outcome.   REHAB POTENTIAL: Good  CLINICAL DECISION MAKING: Evolving/moderate complexity  EVALUATION COMPLEXITY: Moderate   GOALS: Goals reviewed with patient? Yes  SHORT TERM GOALS: Target date: 10/01/23  Pt will tolerate full aquatic sessions consistently without increase in pain and with improving function to demonstrate good toleration and effectiveness of intervention.  Baseline: Goal status: In progress - 09/23/23; MET 10/04/23  2.  Pt will have 25% improvement in lumbar ROM without increase in pain Baseline: see chart Goal status:  Met 10/11/23  3.  Pt will complete 10 consecutive STS transfers from water bench onto water step without UE support or increase in pain Baseline:  Goal status: In progress complete once indep 09/25/23/ Met 10/04/23  4.  Pt will demonstrate improved toleration to activity by walking to and from setting, completing entire aquatic therapy session without reports of increased pain or fatigue Baseline:  Goal status:Met 10/04/23  5.  Pt will perform SLS and tandem stance holding x 15s submerged in 3.6 ft unsupported to demonstrate improvement in balance Baseline:  Goal status: In progress 09/25/23 completes with ue support; Met 10/04/23  6.  Pt will report not waking at night consistently due to pain. Baseline: nightly Goal status: In progress with help form gabapentin 09/25/23; Met 10/04/23 improved toleration to sleeping in sup  LONG TERM GOALS: Target date: 11/02/23  Pt will improve on Foto  score by at least 8% points to reach MDC demonstrating improved perception of function Goal Baseline: 30% Goal status: Exceeded 10/08/23  2.  Pt will tolerate walking dogs usual path including uphill without reports of increased LBP. Baseline: extreme pain upon completion Goal status: Met 10/18/23  3.  Pt will improve strength in all areas listed by at least 10 lbs to demonstrate improved overall physical function Baseline: see chart Goal status: in Progress 10/18/23  4.  Pt will improve on Tug test to <or= 13s to demonstrate improvement in lower extremity function, mobility and decreased fall risk. Baseline: 16.99 Goal status: In progress 10/18/23  5.  Pt will report decrease in "normal pain" by at least 3 NPRS for improved toleration to activity Baseline: 5/10 Goal status: Met for LB 10/18/23  6.  Pt will be indep with final HEP's (land and aquatic as appropriate) for continued management of condition Baseline: none Goal status: INITIAL  PLAN:  PT FREQUENCY: 2x/week  PT DURATION: 8 weeks  PLANNED  INTERVENTIONS: [id number]- PT Re-evaluation, 97110-Therapeutic exercises, 97530- Therapeutic activity, 97112- Neuromuscular re-education, 97535- Self Care, 29562- Manual therapy, [phone]- Gait training, [phone]- Orthotic Fit/training, [phone]- Aquatic Therapy, [phone]- Electrical stimulation (unattended), Patient/Family education, Balance training, Stair training, Taping, Dry Needling, Joint mobilization, DME instructions, Cryotherapy, and Moist heat.  PLAN FOR NEXT SESSION: Plan aquatics and land: 1 more session for HEP. Land based for continued progression toward goals. Land consider DN  Rushie Chestnut) Ziemba MPT 10/22/23 4:17 PM [facility] [address] [place], [place], [address] Phone: [phone]   Fax:  [phone]

## 2023-10-23 ENCOUNTER — Encounter (HOSPITAL_BASED_OUTPATIENT_CLINIC_OR_DEPARTMENT_OTHER): Payer: Self-pay | Admitting: Physical Therapy

## 2023-10-25 ENCOUNTER — Ambulatory Visit (HOSPITAL_BASED_OUTPATIENT_CLINIC_OR_DEPARTMENT_OTHER): Payer: PPO | Admitting: Physical Therapy

## 2023-10-25 ENCOUNTER — Encounter (HOSPITAL_BASED_OUTPATIENT_CLINIC_OR_DEPARTMENT_OTHER): Payer: Self-pay | Admitting: Physical Therapy

## 2023-10-25 DIAGNOSIS — R262 Difficulty in walking, not elsewhere classified: Secondary | ICD-10-CM | POA: Diagnosis not present

## 2023-10-25 DIAGNOSIS — M25552 Pain in left hip: Secondary | ICD-10-CM | POA: Diagnosis not present

## 2023-10-25 DIAGNOSIS — M5459 Other low back pain: Secondary | ICD-10-CM | POA: Diagnosis not present

## 2023-10-25 DIAGNOSIS — M6281 Muscle weakness (generalized): Secondary | ICD-10-CM

## 2023-10-25 NOTE — Therapy (Signed)
 OUTPATIENT PHYSICAL THERAPY THORACOLUMBAR TREATMENT Progress Note Reporting Period 09/03/23 to 10/18/23  See note below for Objective Data and Assessment of Progress/Goals.      Patient Name: Monica Spence MRN: [id number] DOB:1956-05-25, 68 y.o., female Today's Date: 10/25/2023  END OF SESSION:  PT End of Session - 10/25/23 1111     Visit Number 12    Number of Visits 16    Date for PT Re-Evaluation 11/02/23    Authorization Type [employer]    PT Start Time 1104    PT Stop Time 1142    PT Time Calculation (min) 38 min    Behavior During Therapy WFL for tasks assessed/performed                Past Medical History:  Diagnosis Date   COPD (chronic obstructive pulmonary disease) (HCC)    PONV (postoperative nausea and vomiting)    Past Surgical History:  Procedure Laterality Date   APPENDECTOMY     BIOPSY  05/27/2019   Procedure: BIOPSY;  Surgeon: Malissa Hippo, MD;  Location: [facility];  Service: Endoscopy;;   COLONOSCOPY     ESOPHAGOGASTRODUODENOSCOPY N/A 05/27/2019   Procedure: ESOPHAGOGASTRODUODENOSCOPY (EGD);  Surgeon: Malissa Hippo, MD;  Location: [facility];  Service: Endoscopy;  Laterality: N/A;  9:30   TUBAL LIGATION     Patient Active Problem List   Diagnosis Date Noted   Abdominal pain, epigastric 04/07/2019   Abnormal findings on diagnostic imaging of other abdominal regions, including retroperitoneum 04/07/2019    PCP: Nita Sells MD  REFERRING PROVIDER: Windle Guard, MD   REFERRING DIAG: M54.50 (ICD-10-CM) - Low back pain, unspecified   Rationale for Evaluation and Treatment: Rehabilitation  THERAPY DIAG:  Other low back pain  Difficulty in walking, not elsewhere classified  Muscle weakness (generalized)  ONSET DATE: exacerbation last few months  SUBJECTIVE:                                                                                                                                                                                            SUBJECTIVE STATEMENT: The patient reports relief with recent injection. Pt reports she will begin going to [facility] and doing HEP in pool.  "I feel 75% better".   PERTINENT HISTORY:  COPD  PAIN:  Are you having pain? Yes: NPRS scale: current 2/10   Pain location: R mid/low back  Pain description: ache Aggravating factors: walking > 10 min Relieving factors: meloxicam and gabapentin, resting  PRECAUTIONS: None  RED FLAGS: None   WEIGHT BEARING RESTRICTIONS: No  FALLS:  Has patient fallen in last  6 months?  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  DIAGNOSTIC FINDINGS:  Lumba\r MRI IMPRESSION: 1. At L3-4 there is a mild broad-based disc bulge with a left lateral annular fissure. Moderate bilateral facet arthropathy with ligamentum flavum infolding. Mild spinal stenosis. 2. At L4-5 there is a broad-based disc bulge with a right and left foraminal annular fissure. Severe bilateral facet arthropathy with ligamentum flavum infolding. Moderate spinal stenosis. 3. No acute osseous injury of the lumbar spine.  Hip x ray IMPRESSION: Mild degenerative changes in the right hip without loss of joint space.  PATIENT SURVEYS:  FOTO Primary score 39% with goal of 52% 10/08/23:55%  COGNITION: Overall cognitive status: Within functional limits for tasks assessed     SENSATION: WFL  MUSCLE LENGTH: Hamstrings: tightness bilaterally, limited toleration of testing in sitting   POSTURE: rounded shoulders, forward head, and weight shift left  PALPATION: Moderate TTP throughout cervical to mid thoracic then  lumbar spine paraspinals, traps, latissimus, glutes  LUMBAR ROM:   AROM eval 10/11/23  Flexion FT to below patellaP! Full No P! slight hs tightness  Extension Limited 50%P! full  Right lateral flexion Limited 90%P! full  Left lateral flexion Limited 75%P! full  Right rotation    Left rotation     (Blank rows = not tested)  LOWER EXTREMITY ROM:     WFL   LOWER EXTREMITY MMT:    MMT Right eval Left eval R / L 10/18/23  Hip flexion 26.2 41.0 32.3 / 50.8  Hip extension     Hip abduction 12.6 18.7 17.2 / 20.2  Hip adduction     Hip internal rotation     Hip external rotation     Knee flexion     Knee extension 16.0 20.3 20.7 / 26.0  Ankle dorsiflexion     Ankle plantarflexion     Ankle inversion     Ankle eversion      (Blank rows = not tested)  LUMBAR SPECIAL TESTS:  Straight leg raise test: Positive rle and Slump test: Positive right 10/18/23: Slump test neg right  FUNCTIONAL TESTS:  5 times sit to stand: Not tolerated Timed up and go (TUG): 16.99 4 stage balance: Passed 1&2; tandem x 4s; SLS x6s  10/18/23: TUG 13.67  GAIT: Distance walked: 400 ft Assistive device utilized: None Level of assistance: Complete Independence Comments: off loading left, guarded posture, slowed cadence  TREATMENT  10/25/23 Pt seen for aquatic therapy today.  Treatment took place in water 3.5-4.75 ft in depth at the [facility] pool. Temp of water was 91.  Pt entered/exited the pool via stairs independently in step-to pattern with bilat rail.  - unsupported walking forward/ backward with cues for vertical trunk and reciprocal arm swingforward,  - side stepping with arm addct/ abdct without -> with rainbow hand floats  - farmers walk forward/ backward with single yellow hand float at side  - wide stance with UE horizontal add/abd using yellow hand floats x 10 --bow and arrow 3 x 5 - wall push up/ off x 12 - TrA set with hollow long noodle pull down to thighs, x 15, cues to slow  speed  - hip  hinge with forward arm reach, UE on noodle x 10 -decompression position with noodle wrapped posteriorly across chest:cycling  3/11 There-ex:  Ball roll x10 fwd 5 sec hold  5x lateral 5 second hold  Reviewed how to do at home with table  LTR x20  Glut stretch 2x20 sec hold but ad pain on the right   Reviewed how to use her HEP   Neruo re-ed  Ball squeeze 3x10  Supine march 2x10 with review of progression  Supine hip abduction red 3x10  Supine bridge 2x10   Reviwed TA breathing and the role of transverse abdominis in stabilization     10/18/23  Testing  Pt seen for aquatic therapy today.  Treatment took place in water 3.5-4.75 ft in depth at the [facility] pool. Temp of water was 91.  Pt entered/exited the pool via stairs independently in step-to pattern with bilat rail.  - unsupported walking forward/ backward and side stepping with cues for vertical trunk and reciprocal arm swingforward, back and side stepping *farmers walk with yellow HB *hip hinging x 10 -ue horizontal add/abd using yellow HB x 10 staggered stance -bow and arrow -isometric add set using orange BB 5 x 10s hold -STS from 3rd step from bottom x10 unsupported. Cues for glut isometric squeeze and upright posture. -wall push ups x 10 -decompression position with noodle wrapped posteriorly across chest:cycling; hip add/abd   Pt requires the buoyancy and hydrostatic pressure of water for support, and to offload joints by unweighting joint load by at least 50 % in navel deep water and by at least 75-80% in chest to neck deep water.  Viscosity of the water is needed for resistance of strengthening. Water current perturbations provides challenge to standing balance requiring increased core activation.   Last:    3/5 Manual: skilled palpation of trigger points. Trigger point release to thoriacic spine. Grade I and II PA mobilizations to thoriacic spineReviewed self soft tissue  mobilization    There-ex:  Ball roll x10 fwd 5 sec hold  5x lateral 5 second hold  Reviewed how to do at home with table   There-Act  Self Care  Nuro-Re-ed  Row with TA breathing 2x10 red  Shoulder extension with TA breathing yellow 2x10  Reviewed how to grade with RPE  Trigger Point Dry Needling  Initial Treatment: Pt instructed on Dry Needling rational, procedures, and possible side effects. Pt instructed to expect mild to moderate muscle soreness later in the day and/or into the next day.  Pt instructed in methods to reduce muscle soreness. Pt instructed to continue prescribed HEP. Because Dry Needling was performed over or adjacent to a lung field, pt was educated on S/S of pneumothorax and to seek immediate medical attention should they occur.  Patient was educated on signs and symptoms of infection and other risk factors and advised to seek medical attention should they occur.  Patient verbalized understanding of these instructions and education.   Patient Verbal Consent Given: Yes Education Handout Provided: Yes Muscles Treated: 2x bilateral to mid throacic paraspinals using a .30x50 needle  Electrical Stimulation Performed: No Treatment Response/Outcome: great twtich  PATIENT EDUCATION:  Education details: aquatic therapy HEP instruction;  issued laminated copy Person educated: Patient Education method: Explanation Education comprehension: verbalized understanding  HOME EXERCISE PROGRAM: 09/16/23: issued posture and body mechanics hand out  Access Code: [email or web address] URL: [email or web address] Date: 10/16/2023 Prepared by: Lorayne Bender  AQUATIC Access Code: [email or web address] URL: [email or web address] This aquatic home exercise program from [employer] utilizes pictures from land based exercises, but has been adapted prior to lamination  and issuance.   ASSESSMENT:  CLINICAL IMPRESSION: Pt issued laminated aquatic HEP and exercises were reviewed.  Only minimal cues were required for proper execution.  Pt reported no increase in symptoms during session. Pt has reached max potential for aquatic therapy.   Plan for continued land based intervention to be completed at the end of this certification.    From initial evaluation:  Patient is a 69 y.o. f who was seen today for physical therapy evaluation and treatment for LBP. She had a recent injection which improved her sciatic pain but LBP continues into right hip.  Main complaint is her inablility to walk her 2 small dogs on short path that includes a slight up hill area.  She also reports she seems to be getting weaker and she is afraid of falling.  She presents with deficits in LE strength, endurance, activity toleration and functional mobility with ADL's.  She will benefit from skilled PT to improve all deficits and return pt to ability to walk dogs as well as safely care for sick husband.  OBJECTIVE IMPAIRMENTS: Abnormal gait, decreased activity tolerance, decreased balance, decreased endurance, decreased mobility, difficulty walking, decreased ROM, decreased strength, impaired flexibility, postural dysfunction, and pain.   ACTIVITY LIMITATIONS: carrying, lifting, sleeping, transfers, hygiene/grooming, caring for others, and walking dogs  PARTICIPATION LIMITATIONS: cleaning, shopping, community activity, and yard work  PERSONAL FACTORS: Age, Fitness, Past/current experiences, Time since onset of injury/illness/exacerbation, and 1-2 comorbidities: see PmHx  are also affecting patient's functional outcome.   REHAB POTENTIAL: Good  CLINICAL DECISION MAKING: Evolving/moderate complexity  EVALUATION COMPLEXITY: Moderate   GOALS: Goals reviewed with patient? Yes  SHORT TERM GOALS: Target date: 10/01/23  Pt will tolerate full aquatic sessions consistently without increase in  pain and with improving function to demonstrate good toleration and effectiveness of intervention.  Baseline: Goal status: MET 10/04/23  2.  Pt will have 25% improvement in lumbar ROM without increase in pain Baseline: see chart Goal status: Met 10/11/23  3.  Pt will complete 10 consecutive STS transfers from water bench onto water step without UE support or increase in pain Baseline:  Goal status: Met 10/04/23  4.  Pt will demonstrate improved toleration to activity by walking to and from setting, completing entire aquatic therapy session without reports of increased pain or fatigue Baseline:  Goal status:Met 10/04/23  5.  Pt will perform SLS and tandem stance holding x 15s submerged in 3.6 ft unsupported to demonstrate improvement in balance Baseline:  Goal status: Met 10/04/23  6.  Pt will report not waking at night consistently due to pain. Baseline: nightly Goal status: Met 10/04/23 improved toleration to sleeping in sup  LONG TERM GOALS: Target date: 11/02/23  Pt will improve on Foto score by at least 8% points to reach MDC demonstrating improved perception of function Goal Baseline: 30% Goal status: Exceeded 10/08/23  2.  Pt will tolerate walking dogs usual path including uphill without reports of increased LBP. Baseline: extreme pain upon completion Goal status: Met 10/18/23  3.  Pt will improve strength in  all areas listed by at least 10 lbs to demonstrate improved overall physical function Baseline: see chart Goal status: in Progress 10/18/23  4.  Pt will improve on Tug test to <or= 13s to demonstrate improvement in lower extremity function, mobility and decreased fall risk. Baseline: 16.99 Goal status: In progress 10/18/23  5.  Pt will report decrease in "normal pain" by at least 3 NPRS for improved toleration to activity Baseline: 5/10 Goal status: Met for LB 10/18/23  6.  Pt will be indep with final HEP's (land and aquatic as appropriate) for continued management of  condition Baseline: none Goal status: Partially met -10/25/23  PLAN:  PT FREQUENCY: 2x/week  PT DURATION: 8 weeks  PLANNED INTERVENTIONS: 97164- PT Re-evaluation, 97110-Therapeutic exercises, 97530- Therapeutic activity, 97112- Neuromuscular re-education, 97535- Self Care, 16109- Manual therapy, [phone]- Gait training, [phone]- Orthotic Fit/training, [phone]- Aquatic Therapy, [phone]- Electrical stimulation (unattended), Patient/Family education, Balance training, Stair training, Taping, Dry Needling, Joint mobilization, DME instructions, Cryotherapy, and Moist heat.  PLAN FOR NEXT SESSION: Land based for continued progression toward goals. Land consider DN  Mayer Camel, [place] 10/25/23 11:41 AM [facility] [address] [place], [place], [address] Phone: [phone]   Fax:  [phone]

## 2023-10-29 ENCOUNTER — Ambulatory Visit (HOSPITAL_BASED_OUTPATIENT_CLINIC_OR_DEPARTMENT_OTHER): Payer: PPO | Admitting: Physical Therapy

## 2023-10-29 ENCOUNTER — Encounter (HOSPITAL_BASED_OUTPATIENT_CLINIC_OR_DEPARTMENT_OTHER): Payer: Self-pay | Admitting: Physical Therapy

## 2023-10-29 DIAGNOSIS — M25552 Pain in left hip: Secondary | ICD-10-CM | POA: Diagnosis not present

## 2023-10-29 DIAGNOSIS — R262 Difficulty in walking, not elsewhere classified: Secondary | ICD-10-CM | POA: Diagnosis not present

## 2023-10-29 DIAGNOSIS — M5459 Other low back pain: Secondary | ICD-10-CM | POA: Diagnosis not present

## 2023-10-29 DIAGNOSIS — M6281 Muscle weakness (generalized): Secondary | ICD-10-CM | POA: Diagnosis not present

## 2023-10-29 NOTE — Therapy (Signed)
 OUTPATIENT PHYSICAL THERAPY THORACOLUMBAR TREATMENT Progress Note Reporting Period 09/03/23 to 10/18/23  See note below for Objective Data and Assessment of Progress/Goals.      Patient Name: Monica Spence MRN: [id number] DOB:1956-08-04, 68 y.o., female Today's Date: 10/30/2023  END OF SESSION:  PT End of Session - 10/29/23 1058     Visit Number 13    Number of Visits 28    Date for PT Re-Evaluation 12/25/23    Authorization Type [employer]    PT Start Time 1100    PT Stop Time 1144    PT Time Calculation (min) 44 min    Activity Tolerance Patient tolerated treatment well;No increased pain    Behavior During Therapy WFL for tasks assessed/performed                Past Medical History:  Diagnosis Date   COPD (chronic obstructive pulmonary disease) (HCC)    PONV (postoperative nausea and vomiting)    Past Surgical History:  Procedure Laterality Date   APPENDECTOMY     BIOPSY  05/27/2019   Procedure: BIOPSY;  Surgeon: Malissa Hippo, MD;  Location: [facility];  Service: Endoscopy;;   COLONOSCOPY     ESOPHAGOGASTRODUODENOSCOPY N/A 05/27/2019   Procedure: ESOPHAGOGASTRODUODENOSCOPY (EGD);  Surgeon: Malissa Hippo, MD;  Location: [facility];  Service: Endoscopy;  Laterality: N/A;  9:30   TUBAL LIGATION     Patient Active Problem List   Diagnosis Date Noted   Abdominal pain, epigastric 04/07/2019   Abnormal findings on diagnostic imaging of other abdominal regions, including retroperitoneum 04/07/2019    PCP: Nita Sells MD  REFERRING PROVIDER: Windle Guard, MD   REFERRING DIAG: M54.50 (ICD-10-CM) - Low back pain, unspecified   Rationale for Evaluation and Treatment: Rehabilitation  THERAPY DIAG:  Other low back pain  Difficulty in walking, not elsewhere classified  Muscle weakness (generalized)  Pain in left hip  ONSET DATE: exacerbation last few months  SUBJECTIVE:                                                                                                                                                                                            SUBJECTIVE STATEMENT:  3/18  Injection helped to where she could get her shoulders relaxed. Pain is more in the afternoon. Walking her dogs has been really good. Legs don't hurt its more the middle back that makes her go home. Constant ache in middle back dry needling helped a lot. Will start doing pool toward end of the week.    The patient reports relief with recent injection. Pt reports she  will begin going to [facility] and doing HEP in pool.  "I feel 75% better".   PERTINENT HISTORY:  COPD  PAIN:  Are you having pain? Yes: NPRS scale: current 2/10   Pain location: R mid/low back  Pain description: ache Aggravating factors: walking > 10 min Relieving factors: meloxicam and gabapentin, resting  PRECAUTIONS: None  RED FLAGS: None   WEIGHT BEARING RESTRICTIONS: No  FALLS:  Has patient fallen in last 6 months?  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  DIAGNOSTIC FINDINGS:  Lumba\r MRI IMPRESSION: 1. At L3-4 there is a mild broad-based disc bulge with a left lateral annular fissure. Moderate bilateral facet arthropathy with ligamentum flavum infolding. Mild spinal stenosis. 2. At L4-5 there is a broad-based disc bulge with a right and left foraminal annular fissure. Severe bilateral facet arthropathy with ligamentum flavum infolding. Moderate spinal stenosis. 3. No acute osseous injury of the lumbar spine.  Hip x ray IMPRESSION: Mild degenerative changes in the right hip without loss of joint space.  PATIENT  SURVEYS:  FOTO Primary score 39% with goal of 52% 10/08/23:55%  COGNITION: Overall cognitive status: Within functional limits for tasks assessed     SENSATION: WFL  MUSCLE LENGTH: Hamstrings: tightness bilaterally, limited toleration of testing in sitting   POSTURE: rounded shoulders, forward head, and weight shift left  PALPATION: Moderate TTP throughout cervical to mid thoracic then lumbar spine paraspinals, traps, latissimus, glutes  LUMBAR ROM:   AROM eval 10/11/23  Flexion FT to below patellaP! Full No P! slight hs tightness  Extension Limited 50%P! full  Right lateral flexion Limited 90%P! full  Left lateral flexion Limited 75%P! full  Right rotation    Left rotation     (Blank rows = not tested)  LOWER EXTREMITY ROM:     WFL   LOWER EXTREMITY MMT:    MMT Right eval Left eval R / L 10/18/23  Hip flexion 26.2 41.0 32.3 / 50.8  Hip extension     Hip abduction 12.6 18.7 17.2 / 20.2  Hip adduction     Hip internal rotation     Hip external rotation     Knee flexion     Knee extension 16.0 20.3 20.7 / 26.0  Ankle dorsiflexion     Ankle plantarflexion     Ankle inversion     Ankle eversion      (Blank rows = not tested)  LUMBAR SPECIAL TESTS:  Straight leg raise test: Positive rle and Slump test: Positive right 10/18/23: Slump test neg right  FUNCTIONAL TESTS:  5 times sit to stand: Not tolerated Timed up and go (TUG): 16.99 4 stage balance: Passed 1&2; tandem x 4s; SLS x6s  10/18/23: TUG 13.67  GAIT: Distance walked: 400 ft Assistive device utilized: None Level of assistance: Complete Independence Comments: off loading left, guarded posture, slowed cadence  TREATMENT  3/18 Manual:  DN Glute max and paraspinals STM glute max and paraspinals  There-ex:   There-Act  Self Care  Nuro-Re-ed   Machine rows 2x12 RPE 5 reviewed with posture and abdominal breathing  Hip abduction 2x10 40 lbs LF with abdominal breathing PRPE of 4    10/25/23 Pt  seen for aquatic therapy today.  Treatment took place in water 3.5-4.75 ft in depth at the [facility] pool. Temp of water was 91.  Pt entered/exited the pool via stairs independently in step-to pattern with bilat rail.  - unsupported walking forward/ backward with cues for vertical trunk and reciprocal arm swingforward,  - side stepping with arm addct/ abdct without -> with rainbow hand floats  - farmers walk forward/ backward with single yellow hand float at side  - wide stance with UE horizontal add/abd using yellow hand floats x 10 --bow and arrow 3 x 5 - wall push up/ off x 12 - TrA set with hollow long noodle pull down to thighs, x 15, cues to slow speed  - hip hinge with forward arm reach, UE on noodle x 10 -decompression position with noodle wrapped posteriorly across chest:cycling  3/11 There-ex:  Ball roll x10 fwd 5 sec hold  5x lateral 5 second hold  Reviewed how to do at home with table  LTR x20  Glut stretch 2x20 sec hold but ad pain on the right   Reviewed how to use her HEP   Neruo re-ed  Ball squeeze 3x10  Supine march 2x10 with review of progression  Supine hip abduction red 3x10  Supine bridge 2x10   Reviwed TA breathing and the role of transverse abdominis in stabilization     10/18/23  Testing  Pt seen for aquatic therapy today.  Treatment took place in water 3.5-4.75 ft in depth at the [facility] pool. Temp of water was 91.  Pt entered/exited the pool via stairs independently in step-to pattern with bilat rail.  - unsupported walking forward/ backward and side stepping with cues for vertical trunk and reciprocal arm swingforward, back and side stepping *farmers walk with yellow HB *hip hinging x 10 -ue horizontal add/abd using yellow HB x 10 staggered stance -bow and arrow -isometric add set using orange BB 5 x 10s hold -STS from 3rd step from bottom x10 unsupported. Cues for glut isometric squeeze and upright posture. -wall push  ups x 10 -decompression position with noodle wrapped posteriorly across chest:cycling; hip add/abd   Pt requires the buoyancy and hydrostatic pressure of water for support, and to offload joints by unweighting joint load by at least 50 % in navel deep water and by at least 75-80% in chest to neck deep water.  Viscosity of the water is needed for resistance of strengthening. Water current perturbations provides challenge to standing balance requiring increased core activation.   Last:    3/5 Manual: skilled palpation of trigger points. Trigger point release to thoriacic spine. Grade I and II PA mobilizations to thoriacic spineReviewed self soft tissue mobilization    There-ex:  Ball roll x10 fwd 5 sec hold  5x lateral 5 second hold  Reviewed how to do at home with table   There-Act  Self Care  Nuro-Re-ed  Row with TA breathing 2x10 red  Shoulder extension with TA breathing yellow 2x10  Reviewed how to grade with RPE  Trigger Point Dry Needling  Initial Treatment: Pt instructed on Dry Needling rational, procedures, and possible side effects. Pt instructed to expect mild to moderate muscle soreness later in the day and/or into the next day.  Pt instructed in methods to reduce muscle soreness. Pt instructed to continue prescribed HEP. Because Dry Needling was performed over or adjacent to a lung field, pt was educated on S/S of pneumothorax and to seek immediate medical attention should they  occur.  Patient was educated on signs and symptoms of infection and other risk factors and advised to seek medical attention should they occur.  Patient verbalized understanding of these instructions and education.   Patient Verbal Consent Given: Yes Education Handout Provided: Yes Muscles Treated: 2x bilateral to mid throacic paraspinals using a .30x50 needle  Electrical Stimulation Performed: No Treatment Response/Outcome: great twtich                                                                                                                    PATIENT EDUCATION:  Education details: aquatic therapy HEP instruction;  issued laminated copy Person educated: Patient Education method: Explanation Education comprehension: verbalized understanding  HOME EXERCISE PROGRAM: 09/16/23: issued posture and body mechanics hand out  Access Code: [email or web address] URL: [email or web address] Date: 10/16/2023 Prepared by: Lorayne Bender  AQUATIC Access Code: [email or web address] URL: [email or web address] This aquatic home exercise program from [employer] utilizes pictures from land based exercises, but has been adapted prior to lamination and issuance.   ASSESSMENT:  CLINICAL IMPRESSION: 3/18 Pt tolerated DN well. DN glute max and paraspinals and provided soft tissue mobilizations after which was tolerated well and provided relief. Therapy reviewed exercises in the gym. She will be joining the [facility]. We discussed how to grade exercises. We reviewed how to use TA breathing for stabilization. Overall she is making great progress. She she is having less pain. Her strength numbers have improved. She is still having pain but it is better. Her progress note was performed 3 visits ago. We will re-certify patient this visit for 6-8 more weeks to finalize land exercise program.    Pt issued laminated aquatic HEP and exercises were reviewed.  Only minimal cues were required for proper execution.  Pt reported no increase in symptoms during session. Pt has reached max potential for aquatic therapy.   Plan for continued land based intervention to be completed at the end of this certification.    From initial evaluation:  Patient is a 68 y.o. f who was seen today for physical therapy evaluation and treatment for LBP. She had a recent injection which improved her sciatic pain but LBP continues into right hip.  Main complaint is her inablility to walk her 2 small dogs on short path that includes  a slight up hill area.  She also reports she seems to be getting weaker and she is afraid of falling.  She presents with deficits in LE strength, endurance, activity toleration and functional mobility with ADL's.  She will benefit from skilled PT to improve all deficits and return pt to ability to walk dogs as well as safely care for sick husband.  OBJECTIVE IMPAIRMENTS: Abnormal gait, decreased activity tolerance, decreased balance, decreased endurance, decreased mobility, difficulty walking, decreased ROM, decreased strength, impaired flexibility, postural dysfunction, and pain.   ACTIVITY LIMITATIONS: carrying, lifting, sleeping, transfers, hygiene/grooming, caring for others, and walking dogs  PARTICIPATION LIMITATIONS: cleaning, shopping, community activity, and yard work  PERSONAL FACTORS: Age, Fitness, Past/current experiences, Time since onset  of injury/illness/exacerbation, and 1-2 comorbidities: see PmHx  are also affecting patient's functional outcome.   REHAB POTENTIAL: Good  CLINICAL DECISION MAKING: Evolving/moderate complexity  EVALUATION COMPLEXITY: Moderate   GOALS: Goals reviewed with patient? Yes  SHORT TERM GOALS: Target date: 10/01/23  Pt will tolerate full aquatic sessions consistently without increase in pain and with improving function to demonstrate good toleration and effectiveness of intervention.  Baseline: Goal status: MET 10/04/23  2.  Pt will have 25% improvement in lumbar ROM without increase in pain Baseline: see chart Goal status: Met 10/11/23  3.  Pt will complete 10 consecutive STS transfers from water bench onto water step without UE support or increase in pain Baseline:  Goal status: Met 10/04/23  4.  Pt will demonstrate improved toleration to activity by walking to and from setting, completing entire aquatic therapy session without reports of increased pain or fatigue Baseline:  Goal status:Met 10/04/23  5.  Pt will perform SLS and tandem stance  holding x 15s submerged in 3.6 ft unsupported to demonstrate improvement in balance Baseline:  Goal status: Met 10/04/23  6.  Pt will report not waking at night consistently due to pain. Baseline: nightly Goal status: Met 10/04/23 improved toleration to sleeping in sup  7. Patient well increase gross bilateral lower extremity strength to  Goal status: initial 3/19   LONG TERM GOALS: Target date: 11/02/23  Pt will improve on Foto score by at least 8% points to reach MDC demonstrating improved perception of function Goal Baseline: 30% Goal status: Exceeded 10/08/23  2.  Pt will tolerate walking dogs usual path including uphill without reports of increased LBP. Baseline: extreme pain upon completion Goal status: Met 10/18/23  3.  Pt will improve strength in all areas listed by at least 10 lbs to demonstrate improved overall physical function Baseline: see chart Goal status: in Progress 10/18/23  4.  Pt will improve on Tug test to <or= 13s to demonstrate improvement in lower extremity function, mobility and decreased fall risk. Baseline: 16.99 Goal status: In progress 10/18/23  5.  Pt will report decrease in "normal pain" by at least 3 NPRS for improved toleration to activity Baseline: 5/10 Goal status: Met for LB 10/18/23  6.  Pt will be indep with final HEP's (land and aquatic as appropriate) for continued management of condition Baseline: none Goal status: Partially met -10/25/23  PLAN:  PT FREQUENCY: 2x/week  PT DURATION: 8 weeks  PLANNED INTERVENTIONS: 97164- PT Re-evaluation, 97110-Therapeutic exercises, 97530- Therapeutic activity, 97112- Neuromuscular re-education, 97535- Self Care, 16109- Manual therapy, [phone]- Gait training, [phone]- Orthotic Fit/training, [phone]- Aquatic Therapy, [phone]- Electrical stimulation (unattended), Patient/Family education, Balance training, Stair training, Taping, Dry Needling, Joint mobilization, DME instructions, Cryotherapy, and Moist heat.  PLAN FOR  NEXT SESSION: Land based for continued progression toward goals. Land consider DN  Julious Oka PT DPT 10/30/23 2:50 PM [facility] [address] [place], [place], [address] Phone: [phone]   Fax:  [phone]

## 2023-11-01 ENCOUNTER — Encounter (HOSPITAL_BASED_OUTPATIENT_CLINIC_OR_DEPARTMENT_OTHER): Payer: Self-pay

## 2023-11-01 ENCOUNTER — Ambulatory Visit (HOSPITAL_BASED_OUTPATIENT_CLINIC_OR_DEPARTMENT_OTHER): Payer: PPO

## 2023-11-01 DIAGNOSIS — R262 Difficulty in walking, not elsewhere classified: Secondary | ICD-10-CM

## 2023-11-01 DIAGNOSIS — M6281 Muscle weakness (generalized): Secondary | ICD-10-CM | POA: Diagnosis not present

## 2023-11-01 DIAGNOSIS — M5459 Other low back pain: Secondary | ICD-10-CM

## 2023-11-01 DIAGNOSIS — M25552 Pain in left hip: Secondary | ICD-10-CM | POA: Diagnosis not present

## 2023-11-01 NOTE — Therapy (Signed)
 OUTPATIENT PHYSICAL THERAPY THORACOLUMBAR TREATMENT      Patient Name: Monica Spence MRN: [id number] DOB:Jan 02, 1956, 68 y.o., female Today's Date: 11/01/2023  END OF SESSION:  PT End of Session - 11/01/23 1111     Visit Number 14    Number of Visits 28    Date for PT Re-Evaluation 12/25/23    Authorization Type [employer]    PT Start Time 1110   Pt arrived late   PT Stop Time 1145    PT Time Calculation (min) 35 min    Activity Tolerance Patient tolerated treatment well    Behavior During Therapy WFL for tasks assessed/performed                 Past Medical History:  Diagnosis Date   COPD (chronic obstructive pulmonary disease) (HCC)    PONV (postoperative nausea and vomiting)    Past Surgical History:  Procedure Laterality Date   APPENDECTOMY     BIOPSY  05/27/2019   Procedure: BIOPSY;  Surgeon: Malissa Hippo, MD;  [facility]: [facility];  Service: Endoscopy;;   COLONOSCOPY     ESOPHAGOGASTRODUODENOSCOPY N/A 05/27/2019   Procedure: ESOPHAGOGASTRODUODENOSCOPY (EGD);  Surgeon: Malissa Hippo, MD;  Location: [facility];  Service: Endoscopy;  Laterality: N/A;  9:30   TUBAL LIGATION     Patient Active Problem List   Diagnosis Date Noted   Abdominal pain, epigastric 04/07/2019   Abnormal findings on diagnostic imaging of other abdominal regions, including retroperitoneum 04/07/2019    PCP: Nita Sells MD  REFERRING PROVIDER: Windle Guard, MD   REFERRING DIAG: M54.50 (ICD-10-CM) - Low back pain, unspecified   Rationale for Evaluation and Treatment: Rehabilitation  THERAPY DIAG:  Other low back pain  Difficulty in walking, not elsewhere classified  Muscle weakness (generalized)  Pain in left hip  ONSET DATE: exacerbation last few months  SUBJECTIVE:                                                                                                                                                                                            SUBJECTIVE STATEMENT:  Pt reports 4/10 mid back pain. 6/10 neck pain. She has started at [facility].    PERTINENT HISTORY:  COPD  PAIN:  Are you having pain? Yes: NPRS scale: current 4/10   Pain location: R mid/low back  Pain description: ache Aggravating factors: walking > 10 min Relieving factors: meloxicam and gabapentin, resting  PRECAUTIONS: None  RED FLAGS: None   WEIGHT BEARING RESTRICTIONS: No  FALLS:  Has patient fallen in last 6 months?  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  DIAGNOSTIC FINDINGS:  Lumba\r MRI IMPRESSION: 1. At L3-4 there is a mild broad-based disc bulge with a left lateral annular fissure. Moderate bilateral facet arthropathy with ligamentum flavum infolding. Mild spinal stenosis. 2. At L4-5 there is a broad-based disc bulge with a right and left foraminal annular fissure. Severe bilateral facet arthropathy with ligamentum flavum infolding. Moderate spinal stenosis. 3. No acute osseous injury of the lumbar spine.  Hip x ray IMPRESSION: Mild degenerative changes in the right hip without loss of joint space.  PATIENT SURVEYS:  FOTO Primary score 39% with goal of 52% 10/08/23:55%  COGNITION: Overall cognitive status: Within functional limits for tasks assessed     SENSATION: WFL  MUSCLE LENGTH: Hamstrings: tightness bilaterally, limited toleration of testing in sitting   POSTURE: rounded shoulders, forward head, and weight shift left  PALPATION: Moderate TTP throughout cervical to mid thoracic then lumbar spine paraspinals, traps, latissimus, glutes  LUMBAR ROM:   AROM eval 10/11/23   Flexion FT to below patellaP! Full No P! slight hs tightness  Extension Limited 50%P! full  Right lateral flexion Limited 90%P! full  Left lateral flexion Limited 75%P! full  Right rotation    Left rotation     (Blank rows = not tested)  LOWER EXTREMITY ROM:     WFL   LOWER EXTREMITY MMT:    MMT Right eval Left eval R / L 10/18/23  Hip flexion 26.2 41.0 32.3 / 50.8  Hip extension     Hip abduction 12.6 18.7 17.2 / 20.2  Hip adduction     Hip internal rotation     Hip external rotation     Knee flexion     Knee extension 16.0 20.3 20.7 / 26.0  Ankle dorsiflexion     Ankle plantarflexion     Ankle inversion     Ankle eversion      (Blank rows = not tested)  LUMBAR SPECIAL TESTS:  Straight leg raise test: Positive rle and Slump test: Positive right 10/18/23: Slump test neg right  FUNCTIONAL TESTS:  5 times sit to stand: Not tolerated Timed up and go (TUG): 16.99 4 stage balance: Passed 1&2; tandem x 4s; SLS x6s  10/18/23: TUG 13.67  GAIT: Distance walked: 400 ft Assistive device utilized: None Level of assistance: Complete Independence Comments: off loading left, guarded posture, slowed cadence  TREATMENT   Treatment                            11/01/2023: Blank lines following charge title = not provided on this treatment date.   Manual:  TPDN No  There-ex: L stretch at back of bike 10sec x5 Quadruped cat/cow 5" hold x5ea Quadruped thread needle x6ea UTR and LTR 2x10ea  There-Act:  Self Care:  Nuro-Re-ed: Supine horizontal abduction RTB 2x10 Supine bilateral ER RTB 2x10 Supine TA with bil UE raise using single 3# dumbbell x10 Dead bug with TA 2x10 Theraband row with GTB 2x10 Theraband extension with GTB 2x10     3/18 Manual:  DN Glute max and paraspinals STM glute max and paraspinals  There-ex:   There-Act  Self Care  Nuro-Re-ed   Machine rows 2x12 RPE 5 reviewed with posture and abdominal breathing  Hip abduction 2x10 40 lbs LF  with abdominal breathing PRPE of 4    10/25/23 Pt seen for aquatic therapy today.  Treatment took place in water 3.5-4.75 ft in depth at the [facility] pool. Temp of water was 91.  Pt entered/exited the pool via stairs independently in step-to pattern with bilat rail.  - unsupported walking forward/ backward with cues for vertical trunk and reciprocal arm swingforward,  - side stepping with arm addct/ abdct without -> with rainbow hand floats  - farmers walk forward/ backward with single yellow hand float at side  - wide stance with UE horizontal add/abd using yellow hand floats x 10 --bow and arrow 3 x 5 - wall push up/ off x 12 - TrA set with hollow long noodle pull down to thighs, x 15, cues to slow speed  - hip hinge with forward arm reach, UE on noodle x 10 -decompression position with noodle wrapped posteriorly across chest:cycling  3/11 There-ex:  Ball roll x10 fwd 5 sec hold  5x lateral 5 second hold  Reviewed how to do at home with table  LTR x20  Glut stretch 2x20 sec hold but ad pain on the right   Reviewed how to use her HEP   Neruo re-ed  Ball squeeze 3x10  Supine march 2x10 with review of progression  Supine hip abduction red 3x10  Supine bridge 2x10   Reviwed TA breathing and the role of transverse abdominis in stabilization                                                                                      PATIENT EDUCATION:  Education details: aquatic therapy HEP instruction;  issued laminated copy Person educated: Patient Education method: Explanation Education comprehension: verbalized understanding  HOME EXERCISE PROGRAM: 09/16/23: issued posture and body mechanics hand out  Access Code: [email or web address] URL: [email or web address] Date: 10/16/2023 Prepared by: Lorayne Bender  AQUATIC Access Code: [email or web address] URL: [email or web address] This aquatic home exercise program from [employer] utilizes pictures from land based  exercises, but has been adapted prior to lamination and issuance.   ASSESSMENT:  CLINICAL IMPRESSION: Worked on thoracic mobility focused exercises today in addition to core strengthening and NMR. She did well with all tasks following cues for correct performance. Good tolerance for initiation of postural strengthening in supine using light resistance, though some fatigue was present. Reviewed resistance with HEP exercises. Will review gym machines in future sessions as pt will be able to work on strengthening at [employer] as well. Will continue to monitor R sided neck pain.      From initial evaluation:  Patient is a 68 y.o. f who was seen today for physical therapy evaluation and treatment for LBP. She had a recent injection which improved her sciatic pain but LBP continues into right hip.  Main complaint is her inablility to walk her 2 small dogs on short path that includes a slight up hill area.  She also reports she seems to be getting weaker and she is afraid of falling.  She presents with deficits in LE strength,  endurance, activity toleration and functional mobility with ADL's.  She will benefit from skilled PT to improve all deficits and return pt to ability to walk dogs as well as safely care for sick husband.  OBJECTIVE IMPAIRMENTS: Abnormal gait, decreased activity tolerance, decreased balance, decreased endurance, decreased mobility, difficulty walking, decreased ROM, decreased strength, impaired flexibility, postural dysfunction, and pain.   ACTIVITY LIMITATIONS: carrying, lifting, sleeping, transfers, hygiene/grooming, caring for others, and walking dogs  PARTICIPATION LIMITATIONS: cleaning, shopping, community activity, and yard work  PERSONAL FACTORS: Age, Fitness, Past/current experiences, Time since onset of injury/illness/exacerbation, and 1-2 comorbidities: see PmHx  are also affecting patient's functional outcome.   REHAB POTENTIAL: Good  CLINICAL DECISION MAKING:  Evolving/moderate complexity  EVALUATION COMPLEXITY: Moderate   GOALS: Goals reviewed with patient? Yes  SHORT TERM GOALS: Target date: 10/01/23  Pt will tolerate full aquatic sessions consistently without increase in pain and with improving function to demonstrate good toleration and effectiveness of intervention.  Baseline: Goal status: MET 10/04/23  2.  Pt will have 25% improvement in lumbar ROM without increase in pain Baseline: see chart Goal status: Met 10/11/23  3.  Pt will complete 10 consecutive STS transfers from water bench onto water step without UE support or increase in pain Baseline:  Goal status: Met 10/04/23  4.  Pt will demonstrate improved toleration to activity by walking to and from setting, completing entire aquatic therapy session without reports of increased pain or fatigue Baseline:  Goal status:Met 10/04/23  5.  Pt will perform SLS and tandem stance holding x 15s submerged in 3.6 ft unsupported to demonstrate improvement in balance Baseline:  Goal status: Met 10/04/23  6.  Pt will report not waking at night consistently due to pain. Baseline: nightly Goal status: Met 10/04/23 improved toleration to sleeping in sup  7. Patient well increase gross bilateral lower extremity strength to  Goal status: initial 3/19   LONG TERM GOALS: Target date: 11/02/23  Pt will improve on Foto score by at least 8% points to reach MDC demonstrating improved perception of function Goal Baseline: 30% Goal status: Exceeded 10/08/23  2.  Pt will tolerate walking dogs usual path including uphill without reports of increased LBP. Baseline: extreme pain upon completion Goal status: Met 10/18/23  3.  Pt will improve strength in all areas listed by at least 10 lbs to demonstrate improved overall physical function Baseline: see chart Goal status: in Progress 10/18/23  4.  Pt will improve on Tug test to <or= 13s to demonstrate improvement in lower extremity function, mobility and  decreased fall risk. Baseline: 16.99 Goal status: In progress 10/18/23  5.  Pt will report decrease in "normal pain" by at least 3 NPRS for improved toleration to activity Baseline: 5/10 Goal status: Met for LB 10/18/23  6.  Pt will be indep with final HEP's (land and aquatic as appropriate) for continued management of condition Baseline: none Goal status: Partially met -10/25/23  PLAN:  PT FREQUENCY: 2x/week  PT DURATION: 8 weeks  PLANNED INTERVENTIONS: 97164- PT Re-evaluation, 97110-Therapeutic exercises, 97530- Therapeutic activity, 97112- Neuromuscular re-education, 97535- Self Care, 13244- Manual therapy, [phone]- Gait training, [phone]- Orthotic Fit/training, [phone]- Aquatic Therapy, [phone]- Electrical stimulation (unattended), Patient/Family education, Balance training, Stair training, Taping, Dry Needling, Joint mobilization, DME instructions, Cryotherapy, and Moist heat.  PLAN FOR NEXT SESSION: Land based for continued progression toward goals. Land consider DN  Riki Altes, PTA  11/01/23 1:17 PM [facility] [address] [place], [place],  [address] Phone: [phone]   Fax:  [phone]

## 2023-11-05 ENCOUNTER — Encounter (HOSPITAL_BASED_OUTPATIENT_CLINIC_OR_DEPARTMENT_OTHER): Payer: Self-pay

## 2023-11-05 ENCOUNTER — Ambulatory Visit (HOSPITAL_BASED_OUTPATIENT_CLINIC_OR_DEPARTMENT_OTHER): Payer: PPO

## 2023-11-05 DIAGNOSIS — M25552 Pain in left hip: Secondary | ICD-10-CM

## 2023-11-05 DIAGNOSIS — R262 Difficulty in walking, not elsewhere classified: Secondary | ICD-10-CM

## 2023-11-05 DIAGNOSIS — M6281 Muscle weakness (generalized): Secondary | ICD-10-CM

## 2023-11-05 DIAGNOSIS — M5459 Other low back pain: Secondary | ICD-10-CM | POA: Diagnosis not present

## 2023-11-05 NOTE — Therapy (Signed)
 OUTPATIENT PHYSICAL THERAPY THORACOLUMBAR TREATMENT      Patient Name: Monica Spence MRN: [id number] DOB:11-08-1955, 68 y.o., female Today's Date: 11/05/2023  END OF SESSION:  PT End of Session - 11/05/23 1310     Visit Number 15    Number of Visits 28    Date for PT Re-Evaluation 12/25/23    Authorization Type [employer]    PT Start Time 1111   pt arrived late   PT Stop Time 1145    PT Time Calculation (min) 34 min    Activity Tolerance Patient tolerated treatment well    Behavior During Therapy WFL for tasks assessed/performed                  Past Medical History:  Diagnosis Date   COPD (chronic obstructive pulmonary disease) (HCC)    PONV (postoperative nausea and vomiting)    Past Surgical History:  Procedure Laterality Date   APPENDECTOMY     BIOPSY  05/27/2019   Procedure: BIOPSY;  Surgeon: Malissa Hippo, MD;  Location: [facility];  Service: Endoscopy;;   COLONOSCOPY     ESOPHAGOGASTRODUODENOSCOPY N/A 05/27/2019   Procedure: ESOPHAGOGASTRODUODENOSCOPY (EGD);  Surgeon: Malissa Hippo, MD;  Location: [facility];  Service: Endoscopy;  Laterality: N/A;  9:30   TUBAL LIGATION     Patient Active Problem List   Diagnosis Date Noted   Abdominal pain, epigastric 04/07/2019   Abnormal findings on diagnostic imaging of other abdominal regions, including retroperitoneum 04/07/2019    PCP: Nita Sells MD  REFERRING PROVIDER: Windle Guard, MD   REFERRING DIAG: M54.50 (ICD-10-CM) - Low back pain, unspecified   Rationale for Evaluation and Treatment: Rehabilitation  THERAPY DIAG:  Pain in left hip  Muscle weakness (generalized)  Difficulty in walking, not elsewhere classified  Other low back pain  ONSET DATE: exacerbation last few months  SUBJECTIVE:                                                                                                                                                                                            SUBJECTIVE STATEMENT:  Pt reports 6/10 pain level in mid back. Neck feels a little stiff, though not as bad as it was.    PERTINENT HISTORY:  COPD  PAIN:  Are you having pain? Yes: NPRS scale: current 6/10   Pain location: R mid/low back  Pain description: ache Aggravating factors: walking > 10 min Relieving factors: meloxicam and gabapentin, resting  PRECAUTIONS: None  RED FLAGS: None   WEIGHT BEARING RESTRICTIONS: No  FALLS:  Has patient fallen in last 6  months?  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  DIAGNOSTIC FINDINGS:  Lumba\r MRI IMPRESSION: 1. At L3-4 there is a mild broad-based disc bulge with a left lateral annular fissure. Moderate bilateral facet arthropathy with ligamentum flavum infolding. Mild spinal stenosis. 2. At L4-5 there is a broad-based disc bulge with a right and left foraminal annular fissure. Severe bilateral facet arthropathy with ligamentum flavum infolding. Moderate spinal stenosis. 3. No acute osseous injury of the lumbar spine.  Hip x ray IMPRESSION: Mild degenerative changes in the right hip without loss of joint space.  PATIENT SURVEYS:  FOTO Primary score 39% with goal of 52% 10/08/23:55%  COGNITION: Overall cognitive status: Within functional limits for tasks assessed     SENSATION: WFL  MUSCLE LENGTH: Hamstrings: tightness bilaterally, limited toleration of testing in sitting   POSTURE: rounded shoulders, forward head, and weight shift left  PALPATION: Moderate TTP throughout cervical to mid thoracic then lumbar spine paraspinals, traps, latissimus, glutes  LUMBAR  ROM:   AROM eval 10/11/23  Flexion FT to below patellaP! Full No P! slight hs tightness  Extension Limited 50%P! full  Right lateral flexion Limited 90%P! full  Left lateral flexion Limited 75%P! full  Right rotation    Left rotation     (Blank rows = not tested)  LOWER EXTREMITY ROM:     WFL   LOWER EXTREMITY MMT:    MMT Right eval Left eval R / L 10/18/23  Hip flexion 26.2 41.0 32.3 / 50.8  Hip extension     Hip abduction 12.6 18.7 17.2 / 20.2  Hip adduction     Hip internal rotation     Hip external rotation     Knee flexion     Knee extension 16.0 20.3 20.7 / 26.0  Ankle dorsiflexion     Ankle plantarflexion     Ankle inversion     Ankle eversion      (Blank rows = not tested)  LUMBAR SPECIAL TESTS:  Straight leg raise test: Positive rle and Slump test: Positive right 10/18/23: Slump test neg right  FUNCTIONAL TESTS:  5 times sit to stand: Not tolerated Timed up and go (TUG): 16.99 4 stage balance: Passed 1&2; tandem x 4s; SLS x6s  10/18/23: TUG 13.67  GAIT: Distance walked: 400 ft Assistive device utilized: None Level of assistance: Complete Independence Comments: off loading left, guarded posture, slowed cadence  TREATMENT   Treatment                            11/05/2023: Blank lines following charge title = not provided on this treatment date.   Manual:  TPDN No STM to thoracic ps and rhomboid/mid trap in prone There-ex: Rounded back stretch in seated  Quadruped cat/cow 5" hold x10ea UTR and LTR 2x10  Nuro-Re-ed: Supine horizontal abduction RTB 2x10 Supine bilateral ER RTB 2x10 Dead bug with TA 2x10 Marches 90/90 2x10     Treatment                            11/01/2023: Blank lines following charge title =  not provided on this treatment date.   Manual:  TPDN No  There-ex: L stretch at back of bike 10sec x5 Quadruped cat/cow 5" hold x5ea Quadruped thread needle x6ea UTR and LTR 2x10ea  There-Act:  Self  Care:  Nuro-Re-ed: Supine horizontal abduction RTB 2x10 Supine bilateral ER RTB 2x10 Supine TA with bil UE raise using single 3# dumbbell x10 Dead bug with TA 2x10 Theraband row with GTB 2x10 Theraband extension with GTB 2x10     3/18 Manual:  DN Glute max and paraspinals STM glute max and paraspinals  There-ex:   There-Act  Self Care  Nuro-Re-ed   Machine rows 2x12 RPE 5 reviewed with posture and abdominal breathing  Hip abduction 2x10 40 lbs LF with abdominal breathing PRPE of 4    10/25/23 Pt seen for aquatic therapy today.  Treatment took place in water 3.5-4.75 ft in depth at the [facility] pool. Temp of water was 91.  Pt entered/exited the pool via stairs independently in step-to pattern with bilat rail.  - unsupported walking forward/ backward with cues for vertical trunk and reciprocal arm swingforward,  - side stepping with arm addct/ abdct without -> with rainbow hand floats  - farmers walk forward/ backward with single yellow hand float at side  - wide stance with UE horizontal add/abd using yellow hand floats x 10 --bow and arrow 3 x 5 - wall push up/ off x 12 - TrA set with hollow long noodle pull down to thighs, x 15, cues to slow speed  - hip hinge with forward arm reach, UE on noodle x 10 -decompression position with noodle wrapped posteriorly across chest:cycling  3/11 There-ex:  Ball roll x10 fwd 5 sec hold  5x lateral 5 second hold  Reviewed how to do at home with table  LTR x20  Glut stretch 2x20 sec hold but ad pain on the right   Reviewed how to use her HEP   Neruo re-ed  Ball squeeze 3x10  Supine march 2x10 with review of progression  Supine hip abduction red 3x10  Supine bridge 2x10   Reviwed TA breathing and the role of transverse abdominis in stabilization                                                                                      PATIENT EDUCATION:  Education details: aquatic therapy HEP instruction;   issued laminated copy Person educated: Patient Education method: Explanation Education comprehension: verbalized understanding  HOME EXERCISE PROGRAM: 09/16/23: issued posture and body mechanics hand out  Access Code: [email or web address] URL: [email or web address] Date: 10/16/2023 Prepared by: Lorayne Bender  AQUATIC Access Code: [email or web address] URL: [email or web address] This aquatic home exercise program from [employer] utilizes pictures from land based exercises, but has been adapted prior to lamination and issuance.   ASSESSMENT:  CLINICAL IMPRESSION: Pt with increased discomfort in mid thoracic region today. Palpated thoracic paraspinals where significant restrictions were found. Pt very tender here but able to tolerate gentle STM to this area. Continued with postural re-ed and strengthening. She did experience single episode of muscle cramping in mid back which was relieved by rounded back stretch. Good tolerance for core strengthening and was  challenged by 90/90 marching. Educated pt about DOMS and instructed to use heat over thoracic region for pain management. Will continue to progress as tolerated.      From initial evaluation:  Patient is a 68 y.o. f who was seen today for physical therapy evaluation and treatment for LBP. She had a recent injection which improved her sciatic pain but LBP continues into right hip.  Main complaint is her inablility to walk her 2 small dogs on short path that includes a slight up hill area.  She also reports she seems to be getting weaker and she is afraid of falling.  She presents with deficits in LE strength, endurance, activity toleration and functional mobility with ADL's.  She will benefit from skilled PT to improve all deficits and return pt to ability to walk dogs as well as safely care for sick husband.  OBJECTIVE IMPAIRMENTS: Abnormal gait, decreased activity tolerance, decreased balance, decreased endurance, decreased mobility,  difficulty walking, decreased ROM, decreased strength, impaired flexibility, postural dysfunction, and pain.   ACTIVITY LIMITATIONS: carrying, lifting, sleeping, transfers, hygiene/grooming, caring for others, and walking dogs  PARTICIPATION LIMITATIONS: cleaning, shopping, community activity, and yard work  PERSONAL FACTORS: Age, Fitness, Past/current experiences, Time since onset of injury/illness/exacerbation, and 1-2 comorbidities: see PmHx  are also affecting patient's functional outcome.   REHAB POTENTIAL: Good  CLINICAL DECISION MAKING: Evolving/moderate complexity  EVALUATION COMPLEXITY: Moderate   GOALS: Goals reviewed with patient? Yes  SHORT TERM GOALS: Target date: 10/01/23  Pt will tolerate full aquatic sessions consistently without increase in pain and with improving function to demonstrate good toleration and effectiveness of intervention.  Baseline: Goal status: MET 10/04/23  2.  Pt will have 25% improvement in lumbar ROM without increase in pain Baseline: see chart Goal status: Met 10/11/23  3.  Pt will complete 10 consecutive STS transfers from water bench onto water step without UE support or increase in pain Baseline:  Goal status: Met 10/04/23  4.  Pt will demonstrate improved toleration to activity by walking to and from setting, completing entire aquatic therapy session without reports of increased pain or fatigue Baseline:  Goal status:Met 10/04/23  5.  Pt will perform SLS and tandem stance holding x 15s submerged in 3.6 ft unsupported to demonstrate improvement in balance Baseline:  Goal status: Met 10/04/23  6.  Pt will report not waking at night consistently due to pain. Baseline: nightly Goal status: Met 10/04/23 improved toleration to sleeping in sup  7. Patient well increase gross bilateral lower extremity strength to  Goal status: initial 3/19   LONG TERM GOALS: Target date: 11/02/23  Pt will improve on Foto score by at least 8% points to reach  MDC demonstrating improved perception of function Goal Baseline: 30% Goal status: Exceeded 10/08/23  2.  Pt will tolerate walking dogs usual path including uphill without reports of increased LBP. Baseline: extreme pain upon completion Goal status: Met 10/18/23  3.  Pt will improve strength in all areas listed by at least 10 lbs to demonstrate improved overall physical function Baseline: see chart Goal status: in Progress 10/18/23  4.  Pt will improve on Tug test to <or= 13s to demonstrate improvement in lower extremity function, mobility and decreased fall risk. Baseline: 16.99 Goal status: In progress 10/18/23  5.  Pt will report decrease in "normal pain" by at least 3 NPRS for improved toleration to activity Baseline: 5/10 Goal status: Met for LB 10/18/23  6.  Pt will be indep with final  HEP's (land and aquatic as appropriate) for continued management of condition Baseline: none Goal status: Partially met -10/25/23  PLAN:  PT FREQUENCY: 2x/week  PT DURATION: 8 weeks  PLANNED INTERVENTIONS: 97164- PT Re-evaluation, 97110-Therapeutic exercises, 97530- Therapeutic activity, 97112- Neuromuscular re-education, 97535- Self Care, 60454- Manual therapy, [phone]- Gait training, [phone]- Orthotic Fit/training, [phone]- Aquatic Therapy, [phone]- Electrical stimulation (unattended), Patient/Family education, Balance training, Stair training, Taping, Dry Needling, Joint mobilization, DME instructions, Cryotherapy, and Moist heat.  PLAN FOR NEXT SESSION: Land based for continued progression toward goals. Land consider DN  Riki Altes, PTA  11/05/23 1:12 PM [facility] [address] [place], [place], [address] Phone: [phone]   Fax:  [phone]

## 2023-11-08 ENCOUNTER — Ambulatory Visit (HOSPITAL_BASED_OUTPATIENT_CLINIC_OR_DEPARTMENT_OTHER): Payer: PPO

## 2023-11-08 ENCOUNTER — Encounter (HOSPITAL_BASED_OUTPATIENT_CLINIC_OR_DEPARTMENT_OTHER): Payer: Self-pay

## 2023-11-08 DIAGNOSIS — M6281 Muscle weakness (generalized): Secondary | ICD-10-CM | POA: Diagnosis not present

## 2023-11-08 DIAGNOSIS — M5459 Other low back pain: Secondary | ICD-10-CM

## 2023-11-08 DIAGNOSIS — R262 Difficulty in walking, not elsewhere classified: Secondary | ICD-10-CM

## 2023-11-08 DIAGNOSIS — M25552 Pain in left hip: Secondary | ICD-10-CM

## 2023-11-08 NOTE — Therapy (Signed)
 OUTPATIENT PHYSICAL THERAPY THORACOLUMBAR TREATMENT      Patient Name: Monica Spence MRN: [id number] DOB:10-23-1955, 68 y.o., female Today's Date: 11/08/2023  END OF SESSION:  PT End of Session - 11/08/23 1002     Visit Number 16    Number of Visits 28    Date for PT Re-Evaluation 12/25/23    Authorization Type [employer]    PT Start Time 0930    PT Stop Time 1015    PT Time Calculation (min) 45 min    Activity Tolerance Patient tolerated treatment well    Behavior During Therapy WFL for tasks assessed/performed                   Past Medical History:  Diagnosis Date   COPD (chronic obstructive pulmonary disease) (HCC)    PONV (postoperative nausea and vomiting)    Past Surgical History:  Procedure Laterality Date   APPENDECTOMY     BIOPSY  05/27/2019   Procedure: BIOPSY;  Surgeon: Malissa Hippo, MD;  Location: [facility];  Service: Endoscopy;;   COLONOSCOPY     ESOPHAGOGASTRODUODENOSCOPY N/A 05/27/2019   Procedure: ESOPHAGOGASTRODUODENOSCOPY (EGD);  Surgeon: Malissa Hippo, MD;  Location: [facility];  Service: Endoscopy;  Laterality: N/A;  9:30   TUBAL LIGATION     Patient Active Problem List   Diagnosis Date Noted   Abdominal pain, epigastric 04/07/2019   Abnormal findings on diagnostic imaging of other abdominal regions, including retroperitoneum 04/07/2019    PCP: Nita Sells MD  REFERRING PROVIDER: Windle Guard, MD   REFERRING DIAG: M54.50 (ICD-10-CM) - Low back pain, unspecified   Rationale for Evaluation and Treatment: Rehabilitation  THERAPY DIAG:  Pain in left hip  Muscle weakness (generalized)  Difficulty in walking, not elsewhere classified  Other low back pain  ONSET DATE: exacerbation last few months  SUBJECTIVE:                                                                                                                                                                                            SUBJECTIVE STATEMENT:  Pt reports improvement in mid back after last session, but did wake up with some stiffness. She reports ongoing R sided neck tightness and pain.   PERTINENT HISTORY:  COPD  PAIN:  Are you having pain? Yes: NPRS scale: current 6/10   Pain location: R mid/low back  Pain description: ache Aggravating factors: walking > 10 min Relieving factors: meloxicam and gabapentin, resting  PRECAUTIONS: None  RED FLAGS: None   WEIGHT BEARING RESTRICTIONS: No  FALLS:  Has patient fallen in last  6 months?  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  DIAGNOSTIC FINDINGS:  Lumba\r MRI IMPRESSION: 1. At L3-4 there is a mild broad-based disc bulge with a left lateral annular fissure. Moderate bilateral facet arthropathy with ligamentum flavum infolding. Mild spinal stenosis. 2. At L4-5 there is a broad-based disc bulge with a right and left foraminal annular fissure. Severe bilateral facet arthropathy with ligamentum flavum infolding. Moderate spinal stenosis. 3. No acute osseous injury of the lumbar spine.  Hip x ray IMPRESSION: Mild degenerative changes in the right hip without loss of joint space.  PATIENT SURVEYS:  FOTO Primary score 39% with goal of 52% 10/08/23:55%  COGNITION: Overall cognitive status: Within functional limits for tasks assessed     SENSATION: WFL  MUSCLE LENGTH: Hamstrings: tightness bilaterally, limited toleration of testing in sitting   POSTURE: rounded shoulders, forward head, and weight shift left  PALPATION: Moderate TTP throughout cervical to mid thoracic then lumbar spine  paraspinals, traps, latissimus, glutes  LUMBAR ROM:   AROM eval 10/11/23  Flexion FT to below patellaP! Full No P! slight hs tightness  Extension Limited 50%P! full  Right lateral flexion Limited 90%P! full  Left lateral flexion Limited 75%P! full  Right rotation    Left rotation     (Blank rows = not tested)  LOWER EXTREMITY ROM:     WFL   LOWER EXTREMITY MMT:    MMT Right eval Left eval R / L 10/18/23  Hip flexion 26.2 41.0 32.3 / 50.8  Hip extension     Hip abduction 12.6 18.7 17.2 / 20.2  Hip adduction     Hip internal rotation     Hip external rotation     Knee flexion     Knee extension 16.0 20.3 20.7 / 26.0  Ankle dorsiflexion     Ankle plantarflexion     Ankle inversion     Ankle eversion      (Blank rows = not tested)  LUMBAR SPECIAL TESTS:  Straight leg raise test: Positive rle and Slump test: Positive right 10/18/23: Slump test neg right  FUNCTIONAL TESTS:  5 times sit to stand: Not tolerated Timed up and go (TUG): 16.99 4 stage balance: Passed 1&2; tandem x 4s; SLS x6s  10/18/23: TUG 13.67  GAIT: Distance walked: 400 ft Assistive device utilized: None Level of assistance: Complete Independence Comments: off loading left, guarded posture, slowed cadence  TREATMENT     Treatment                            11/08/2023: Blank lines following charge title = not provided on this treatment date.   Manual:  TPDN No STM to thoracic ps and rhomboid/mid trap in prone STM to R cervical paraspinals, UT, LS There-ex: Rounded back stretch in seated  Quadruped cat stretch on elbows 5" 2x10 UTR and LTR x10 Review and update of HEP  Nuro-Re-ed: Dead bug with TA 2x10 Marches 90/90 2x10    Treatment                            11/05/2023:  Blank lines following charge title = not provided on this treatment date.   Manual:  TPDN No STM to thoracic ps and rhomboid/mid trap in prone There-ex: Rounded back stretch in seated  Quadruped cat/cow 5" hold  x10ea UTR and LTR 2x10  Nuro-Re-ed: Supine horizontal abduction RTB 2x10 Supine bilateral ER RTB 2x10 Dead bug with TA 2x10 Marches 90/90 2x10     Treatment                            11/01/2023: Blank lines following charge title = not provided on this treatment date.   Manual:  TPDN No  There-ex: L stretch at back of bike 10sec x5 Quadruped cat/cow 5" hold x5ea Quadruped thread needle x6ea UTR and LTR 2x10ea  There-Act:  Self Care:  Nuro-Re-ed: Supine horizontal abduction RTB 2x10 Supine bilateral ER RTB 2x10 Supine TA with bil UE raise using single 3# dumbbell x10 Dead bug with TA 2x10 Theraband row with GTB 2x10 Theraband extension with GTB 2x10     3/18 Manual:  DN Glute max and paraspinals STM glute max and paraspinals  There-ex:   There-Act  Self Care  Nuro-Re-ed   Machine rows 2x12 RPE 5 reviewed with posture and abdominal breathing  Hip abduction 2x10 40 lbs LF with abdominal breathing PRPE of 4    10/25/23 Pt seen for aquatic therapy today.  Treatment took place in water 3.5-4.75 ft in depth at the [facility] pool. Temp of water was 91.  Pt entered/exited the pool via stairs independently in step-to pattern with bilat rail.  - unsupported walking forward/ backward with cues for vertical trunk and reciprocal arm swingforward,  - side stepping with arm addct/ abdct without -> with rainbow hand floats  - farmers walk forward/ backward with single yellow hand float at side  - wide stance with UE horizontal add/abd using yellow hand floats x 10 --bow and arrow 3 x 5 - wall push up/ off x 12 - TrA set with hollow long noodle pull down to thighs, x 15, cues to slow speed  - hip hinge with forward arm reach, UE on noodle x 10 -decompression position with noodle wrapped posteriorly across chest:cycling  3/11 There-ex:  Ball roll x10 fwd 5 sec hold  5x lateral 5 second hold  Reviewed how to do at home with table  LTR x20  Glut  stretch 2x20 sec hold but ad pain on the right   Reviewed how to use her HEP   Neruo re-ed  Ball squeeze 3x10  Supine march 2x10 with review of progression  Supine hip abduction red 3x10  Supine bridge 2x10   Reviwed TA breathing and the role of transverse abdominis in stabilization                                                                                      PATIENT EDUCATION:  Education details: aquatic therapy HEP instruction;  issued laminated copy Person educated: Patient Education method: Explanation Education comprehension: verbalized understanding  HOME EXERCISE PROGRAM: 09/16/23: issued posture and body mechanics hand out  Access Code: [email or web address] URL: [email or web address]  Date: 10/16/2023 Prepared by: Lorayne Bender  AQUATIC Access Code: [email or web address] URL: [email or web address] This aquatic home exercise program from [employer] utilizes pictures from land based exercises, but has been adapted prior to lamination and issuance.   ASSESSMENT:  CLINICAL IMPRESSION: Continued to work on manual interventions to address ongoing tightness in R cervical region and bil mid thoracic paraspinals. Pt reports significant benefit from this. Good tolerance for core strengthening. Added to HEP for her to complete at home.  She plans to travel out of town soon and will return in about a week.      From initial evaluation:  Patient is a 68 y.o. f who was seen today for physical therapy evaluation and treatment for LBP. She had a recent injection which improved her sciatic pain but LBP continues into right hip.  Main complaint is her inablility to walk her 2 small dogs on short path that includes a slight up hill area.  She also reports she seems to be getting weaker and she is afraid of falling.  She presents with deficits in LE strength, endurance, activity toleration and functional mobility with ADL's.  She will benefit from skilled PT to improve all deficits  and return pt to ability to walk dogs as well as safely care for sick husband.  OBJECTIVE IMPAIRMENTS: Abnormal gait, decreased activity tolerance, decreased balance, decreased endurance, decreased mobility, difficulty walking, decreased ROM, decreased strength, impaired flexibility, postural dysfunction, and pain.   ACTIVITY LIMITATIONS: carrying, lifting, sleeping, transfers, hygiene/grooming, caring for others, and walking dogs  PARTICIPATION LIMITATIONS: cleaning, shopping, community activity, and yard work  PERSONAL FACTORS: Age, Fitness, Past/current experiences, Time since onset of injury/illness/exacerbation, and 1-2 comorbidities: see PmHx  are also affecting patient's functional outcome.   REHAB POTENTIAL: Good  CLINICAL DECISION MAKING: Evolving/moderate complexity  EVALUATION COMPLEXITY: Moderate   GOALS: Goals reviewed with patient? Yes  SHORT TERM GOALS: Target date: 10/01/23  Pt will tolerate full aquatic sessions consistently without increase in pain and with improving function to demonstrate good toleration and effectiveness of intervention.  Baseline: Goal status: MET 10/04/23  2.  Pt will have 25% improvement in lumbar ROM without increase in pain Baseline: see chart Goal status: Met 10/11/23  3.  Pt will complete 10 consecutive STS transfers from water bench onto water step without UE support or increase in pain Baseline:  Goal status: Met 10/04/23  4.  Pt will demonstrate improved toleration to activity by walking to and from setting, completing entire aquatic therapy session without reports of increased pain or fatigue Baseline:  Goal status:Met 10/04/23  5.  Pt will perform SLS and tandem stance holding x 15s submerged in 3.6 ft unsupported to demonstrate improvement in balance Baseline:  Goal status: Met 10/04/23  6.  Pt will report not waking at night consistently due to pain. Baseline: nightly Goal status: Met 10/04/23 improved toleration to sleeping in  sup  7. Patient well increase gross bilateral lower extremity strength to  Goal status: initial 3/19   LONG TERM GOALS: Target date: 11/02/23  Pt will improve on Foto score by at least 8% points to reach MDC demonstrating improved perception of function Goal Baseline: 30% Goal status: Exceeded 10/08/23  2.  Pt will tolerate walking dogs usual path including uphill without reports of increased LBP. Baseline: extreme pain upon completion Goal status: Met 10/18/23  3.  Pt will improve strength in all areas listed by at least 10 lbs to demonstrate improved overall physical function Baseline: see chart Goal status:  in Progress 10/18/23  4.  Pt will improve on Tug test to <or= 13s to demonstrate improvement in lower extremity function, mobility and decreased fall risk. Baseline: 16.99 Goal status: In progress 10/18/23  5.  Pt will report decrease in "normal pain" by at least 3 NPRS for improved toleration to activity Baseline: 5/10 Goal status: Met for LB 10/18/23  6.  Pt will be indep with final HEP's (land and aquatic as appropriate) for continued management of condition Baseline: none Goal status: Partially met -10/25/23  PLAN:  PT FREQUENCY: 2x/week  PT DURATION: 8 weeks  PLANNED INTERVENTIONS: 97164- PT Re-evaluation, 97110-Therapeutic exercises, 97530- Therapeutic activity, 97112- Neuromuscular re-education, 97535- Self Care, 16109- Manual therapy, [phone]- Gait training, [phone]- Orthotic Fit/training, [phone]- Aquatic Therapy, [phone]- Electrical stimulation (unattended), Patient/Family education, Balance training, Stair training, Taping, Dry Needling, Joint mobilization, DME instructions, Cryotherapy, and Moist heat.  PLAN FOR NEXT SESSION: Land based for continued progression toward goals. Land consider DN  Riki Altes, PTA  11/08/23 11:20 AM [facility] [address] [place], [place], [address] Phone: [phone]   Fax:   [phone]

## 2023-11-13 ENCOUNTER — Encounter (HOSPITAL_BASED_OUTPATIENT_CLINIC_OR_DEPARTMENT_OTHER): Admitting: Physical Therapy

## 2023-11-19 ENCOUNTER — Ambulatory Visit (HOSPITAL_BASED_OUTPATIENT_CLINIC_OR_DEPARTMENT_OTHER): Attending: Anesthesiology | Admitting: Physical Therapy

## 2023-11-19 ENCOUNTER — Encounter (HOSPITAL_BASED_OUTPATIENT_CLINIC_OR_DEPARTMENT_OTHER): Payer: Self-pay | Admitting: Physical Therapy

## 2023-11-19 DIAGNOSIS — R262 Difficulty in walking, not elsewhere classified: Secondary | ICD-10-CM | POA: Insufficient documentation

## 2023-11-19 DIAGNOSIS — M5459 Other low back pain: Secondary | ICD-10-CM | POA: Diagnosis not present

## 2023-11-19 DIAGNOSIS — M25552 Pain in left hip: Secondary | ICD-10-CM | POA: Insufficient documentation

## 2023-11-19 DIAGNOSIS — M6281 Muscle weakness (generalized): Secondary | ICD-10-CM | POA: Insufficient documentation

## 2023-11-19 NOTE — Therapy (Signed)
 OUTPATIENT PHYSICAL THERAPY THORACOLUMBAR TREATMENT      Patient Name: Monica Spence MRN: [id number] DOB:27-Jun-1956, 68 y.o., female Today's Date: 11/19/2023  END OF SESSION:  PT End of Session - 11/19/23 0853     Visit Number 17    Number of Visits 28    Date for PT Re-Evaluation 12/25/23    Authorization Type [employer]    PT Start Time 0845    PT Stop Time 0927    PT Time Calculation (min) 42 min    Activity Tolerance Patient tolerated treatment well    Behavior During Therapy [facility] for tasks assessed/performed                   Past Medical History:  Diagnosis Date   COPD (chronic obstructive pulmonary disease) (HCC)    PONV (postoperative nausea and vomiting)    Past Surgical History:  Procedure Laterality Date   APPENDECTOMY     BIOPSY  05/27/2019   Procedure: BIOPSY;  Surgeon: Malissa Hippo, MD;  Location: [facility];  Service: Endoscopy;;   COLONOSCOPY     ESOPHAGOGASTRODUODENOSCOPY N/A 05/27/2019   Procedure: ESOPHAGOGASTRODUODENOSCOPY (EGD);  Surgeon: Malissa Hippo, MD;  Location: [facility];  Service: Endoscopy;  Laterality: N/A;  9:30   TUBAL LIGATION     Patient Active Problem List   Diagnosis Date Noted   Abdominal pain, epigastric 04/07/2019   Abnormal findings on diagnostic imaging of other abdominal regions, including retroperitoneum 04/07/2019    PCP: Nita Sells MD  REFERRING PROVIDER: Windle Guard, MD   REFERRING DIAG: M54.50 (ICD-10-CM) - Low back pain, unspecified   Rationale for Evaluation and Treatment: Rehabilitation  THERAPY DIAG:  Pain in left hip  Muscle weakness (generalized)  Difficulty in walking, not elsewhere classified  Other low back pain  ONSET DATE: exacerbation last few months  SUBJECTIVE:                                                                                                                                                                                            SUBJECTIVE STATEMENT:  The patient has been traveling. She feels like she is having soreness on her right side.  PERTINENT HISTORY:  COPD  PAIN:  Are you having pain? Yes: NPRS scale: current 6/10   Pain location: R mid/low back  Pain description: ache Aggravating factors: walking > 10 min Relieving factors: meloxicam and gabapentin, resting  PRECAUTIONS: None  RED FLAGS: None   WEIGHT BEARING RESTRICTIONS: No  FALLS:  Has patient fallen in last 6 months?  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  DIAGNOSTIC FINDINGS:  Lumba\r MRI IMPRESSION: 1. At L3-4 there is a mild broad-based disc bulge with a left lateral annular fissure. Moderate bilateral facet arthropathy with ligamentum flavum infolding. Mild spinal stenosis. 2. At L4-5 there is a broad-based disc bulge with a right and left foraminal annular fissure. Severe bilateral facet arthropathy with ligamentum flavum infolding. Moderate spinal stenosis. 3. No acute osseous injury of the lumbar spine.  Hip x ray IMPRESSION: Mild degenerative changes in the right hip without loss of joint space.  PATIENT SURVEYS:  FOTO Primary score 39% with goal of 52% 10/08/23:55%  COGNITION: Overall cognitive status: Within functional limits for tasks assessed     SENSATION: WFL  MUSCLE LENGTH: Hamstrings: tightness bilaterally, limited toleration of testing in sitting   POSTURE: rounded shoulders, forward head, and weight shift left  PALPATION: Moderate TTP throughout cervical to mid thoracic then lumbar spine paraspinals, traps, latissimus, glutes  LUMBAR ROM:   AROM eval  10/11/23  Flexion FT to below patellaP! Full No P! slight hs tightness  Extension Limited 50%P! full  Right lateral flexion Limited 90%P! full  Left lateral flexion Limited 75%P! full  Right rotation    Left rotation     (Blank rows = not tested)  LOWER EXTREMITY ROM:     WFL   LOWER EXTREMITY MMT:    MMT Right eval Left eval R / L 10/18/23  Hip flexion 26.2 41.0 32.3 / 50.8  Hip extension     Hip abduction 12.6 18.7 17.2 / 20.2  Hip adduction     Hip internal rotation     Hip external rotation     Knee flexion     Knee extension 16.0 20.3 20.7 / 26.0  Ankle dorsiflexion     Ankle plantarflexion     Ankle inversion     Ankle eversion      (Blank rows = not tested)  LUMBAR SPECIAL TESTS:  Straight leg raise test: Positive rle and Slump test: Positive right 10/18/23: Slump test neg right  FUNCTIONAL TESTS:  5 times sit to stand: Not tolerated Timed up and go (TUG): 16.99 4 stage balance: Passed 1&2; tandem x 4s; SLS x6s  10/18/23: TUG 13.67  GAIT: Distance walked: 400 ft Assistive device utilized: None Level of assistance: Complete Independence Comments: off loading left, guarded posture, slowed cadence  TREATMENT   4/8  Manual: Trigger point release to gluteals and lower back   There-ex:  LTR 2x15  Gluteal stretch 3x20 sec hold bilateral   Nuero-re-ed   Treatment                            11/08/2023: Blank lines following charge title = not provided on this treatment date.   Manual:  TPDN No STM to thoracic ps and rhomboid/mid trap in prone STM to R cervical paraspinals, UT, LS There-ex: Rounded back stretch in seated  Quadruped cat stretch on elbows 5" 2x10 UTR and LTR x10 Review and update of HEP  Nuro-Re-ed: Dead bug with TA 2x10 Marches 90/90 2x10    Treatment  11/05/2023: Blank lines following charge title = not provided on this treatment date.   Manual:  TPDN No STM to thoracic ps and rhomboid/mid trap in  prone There-ex: Rounded back stretch in seated  Quadruped cat/cow 5" hold x10ea UTR and LTR 2x10  Nuro-Re-ed: Supine horizontal abduction RTB 2x10 Supine bilateral ER RTB 2x10 Dead bug with TA 2x10 Marches 90/90 2x10     Treatment                            11/01/2023: Blank lines following charge title = not provided on this treatment date.   Manual:  TPDN No  There-ex: L stretch at back of bike 10sec x5 Quadruped cat/cow 5" hold x5ea Quadruped thread needle x6ea UTR and LTR 2x10ea  There-Act:  Self Care:  Nuro-Re-ed: Supine horizontal abduction RTB 2x10 Supine bilateral ER RTB 2x10 Supine TA with bil UE raise using single 3# dumbbell x10 Dead bug with TA 2x10 Theraband row with GTB 2x10 Theraband extension with GTB 2x10     3/18 Manual:  DN Glute max and paraspinals STM glute max and paraspinals  There-ex:   There-Act  Self Care  Nuro-Re-ed   Machine rows 2x12 RPE 5 reviewed with posture and abdominal breathing  Hip abduction 2x10 40 lbs LF with abdominal breathing PRPE of 4    10/25/23 Pt seen for aquatic therapy today.  Treatment took place in water 3.5-4.75 ft in depth at the [facility] pool. Temp of water was 91.  Pt entered/exited the pool via stairs independently in step-to pattern with bilat rail.  - unsupported walking forward/ backward with cues for vertical trunk and reciprocal arm swingforward,  - side stepping with arm addct/ abdct without -> with rainbow hand floats  - farmers walk forward/ backward with single yellow hand float at side  - wide stance with UE horizontal add/abd using yellow hand floats x 10 --bow and arrow 3 x 5 - wall push up/ off x 12 - TrA set with hollow long noodle pull down to thighs, x 15, cues to slow speed  - hip hinge with forward arm reach, UE on noodle x 10 -decompression position with noodle wrapped posteriorly across chest:cycling  3/11 There-ex:  Ball roll x10 fwd 5 sec hold  5x  lateral 5 second hold  Reviewed how to do at home with table  LTR x20  Glut stretch 2x20 sec hold but ad pain on the right   Reviewed how to use her HEP   Neruo re-ed  Ball squeeze 3x10  Supine march 2x10 with review of progression  Supine hip abduction red 3x10  Supine bridge 2x10   Reviwed TA breathing and the role of transverse abdominis in stabilization                                                                                      PATIENT EDUCATION:  Education details: aquatic therapy HEP instruction;  issued laminated copy Person educated: Patient Education method: Explanation Education comprehension: verbalized understanding  HOME EXERCISE PROGRAM: 09/16/23: issued posture and body mechanics hand out  Access Code: [email or web address] URL:  [email or web address] Date: 10/16/2023 Prepared by: Lorayne Bender  AQUATIC Access Code: [email or web address] URL: [email or web address] This aquatic home exercise program from [employer] utilizes pictures from land based exercises, but has been adapted prior to lamination and issuance.   ASSESSMENT:  CLINICAL IMPRESSION: The patient continues to have a large trigger point in her gluteal and another in her lower lumbar spine. Therapy performed manual therapy to this area> We reviewed how to use her program of home. We added a LAQ with light resistance She had mild popping in her knee with LAQ. Therapy will continue to expand her exercises.      From initial evaluation:  Patient is a 68 y.o. f who was seen today for physical therapy evaluation and treatment for LBP. She had a recent injection which improved her sciatic pain but LBP continues into right hip.  Main complaint is her inablility to walk her 2 small dogs on short path that includes a slight up hill area.  She also reports she seems to be getting weaker and she is afraid of falling.  She presents with deficits in LE strength, endurance, activity toleration and  functional mobility with ADL's.  She will benefit from skilled PT to improve all deficits and return pt to ability to walk dogs as well as safely care for sick husband.  OBJECTIVE IMPAIRMENTS: Abnormal gait, decreased activity tolerance, decreased balance, decreased endurance, decreased mobility, difficulty walking, decreased ROM, decreased strength, impaired flexibility, postural dysfunction, and pain.   ACTIVITY LIMITATIONS: carrying, lifting, sleeping, transfers, hygiene/grooming, caring for others, and walking dogs  PARTICIPATION LIMITATIONS: cleaning, shopping, community activity, and yard work  PERSONAL FACTORS: Age, Fitness, Past/current experiences, Time since onset of injury/illness/exacerbation, and 1-2 comorbidities: see PmHx  are also affecting patient's functional outcome.   REHAB POTENTIAL: Good  CLINICAL DECISION MAKING: Evolving/moderate complexity  EVALUATION COMPLEXITY: Moderate   GOALS: Goals reviewed with patient? Yes  SHORT TERM GOALS: Target date: 10/01/23  Pt will tolerate full aquatic sessions consistently without increase in pain and with improving function to demonstrate good toleration and effectiveness of intervention.  Baseline: Goal status: MET 10/04/23  2.  Pt will have 25% improvement in lumbar ROM without increase in pain Baseline: see chart Goal status: Met 10/11/23  3.  Pt will complete 10 consecutive STS transfers from water bench onto water step without UE support or increase in pain Baseline:  Goal status: Met 10/04/23  4.  Pt will demonstrate improved toleration to activity by walking to and from setting, completing entire aquatic therapy session without reports of increased pain or fatigue Baseline:  Goal status:Met 10/04/23  5.  Pt will perform SLS and tandem stance holding x 15s submerged in 3.6 ft unsupported to demonstrate improvement in balance Baseline:  Goal status: Met 10/04/23  6.  Pt will report not waking at night consistently due  to pain. Baseline: nightly Goal status: Met 10/04/23 improved toleration to sleeping in sup  7. Patient well increase gross bilateral lower extremity strength by 5 lbs  Goal status: 4/8 Therapy continues to progress strengthening   LONG TERM GOALS: Target date: 11/02/23  Pt will improve on Foto score by at least 8% points to reach MDC demonstrating improved perception of function Goal Baseline: 30% Goal status: Exceeded 10/08/23  2.  Pt will tolerate walking dogs usual path including uphill without reports of increased LBP. Baseline: extreme pain upon completion Goal status: Met 10/18/23  3.  Pt will improve strength in all areas listed by at least 10 lbs to  demonstrate improved overall physical function Baseline: see chart Goal status: in Progress 10/18/23  4.  Pt will improve on Tug test to <or= 13s to demonstrate improvement in lower extremity function, mobility and decreased fall risk. Baseline: 16.99 Goal status: In progress 10/18/23  5.  Pt will report decrease in "normal pain" by at least 3 NPRS for improved toleration to activity Baseline: 5/10 Goal status: Met for LB 10/18/23  6.  Pt will be indep with final HEP's (land and aquatic as appropriate) for continued management of condition Baseline: none Goal status: Partially met -10/25/23  PLAN:  PT FREQUENCY: 2x/week  PT DURATION: 8 weeks  PLANNED INTERVENTIONS: 97164- PT Re-evaluation, 97110-Therapeutic exercises, 97530- Therapeutic activity, 97112- Neuromuscular re-education, 97535- Self Care, 16109- Manual therapy, [phone]- Gait training, [phone]- Orthotic Fit/training, [phone]- Aquatic Therapy, [phone]- Electrical stimulation (unattended), Patient/Family education, Balance training, Stair training, Taping, Dry Needling, Joint mobilization, DME instructions, Cryotherapy, and Moist heat.  PLAN FOR NEXT SESSION: Land based for continued progression toward goals. Land consider DN  Lorayne Bender PT DPT  11/19/23 9:10 AM [facility]  [facility] [address] [place], [place], [address] Phone: [phone]   Fax:  [phone]

## 2023-11-22 ENCOUNTER — Encounter (HOSPITAL_BASED_OUTPATIENT_CLINIC_OR_DEPARTMENT_OTHER): Payer: Self-pay | Admitting: Physical Therapy

## 2023-11-22 ENCOUNTER — Ambulatory Visit (HOSPITAL_BASED_OUTPATIENT_CLINIC_OR_DEPARTMENT_OTHER): Admitting: Physical Therapy

## 2023-11-22 DIAGNOSIS — M6281 Muscle weakness (generalized): Secondary | ICD-10-CM

## 2023-11-22 DIAGNOSIS — R262 Difficulty in walking, not elsewhere classified: Secondary | ICD-10-CM | POA: Diagnosis not present

## 2023-11-22 DIAGNOSIS — M25552 Pain in left hip: Secondary | ICD-10-CM | POA: Diagnosis not present

## 2023-11-22 DIAGNOSIS — M5459 Other low back pain: Secondary | ICD-10-CM | POA: Diagnosis not present

## 2023-11-22 NOTE — Therapy (Signed)
 OUTPATIENT PHYSICAL THERAPY THORACOLUMBAR TREATMENT      Patient Name: Monica Spence MRN: [id number] DOB:07-15-1956, 68 y.o., female Today's Date: 11/22/2023  END OF SESSION:  PT End of Session - 11/22/23 0936     Visit Number 18    Number of Visits 28    Date for PT Re-Evaluation 12/25/23    Authorization Type [employer]    PT Start Time 0936    PT Stop Time 1015    PT Time Calculation (min) 39 min    Activity Tolerance Patient tolerated treatment well;No increased pain    Behavior During Therapy WFL for tasks assessed/performed                    Past Medical History:  Diagnosis Date   COPD (chronic obstructive pulmonary disease) (HCC)    PONV (postoperative nausea and vomiting)    Past Surgical History:  Procedure Laterality Date   APPENDECTOMY     BIOPSY  05/27/2019   Procedure: BIOPSY;  Surgeon: Malissa Hippo, MD;  Location: [facility];  Service: Endoscopy;;   COLONOSCOPY     ESOPHAGOGASTRODUODENOSCOPY N/A 05/27/2019   Procedure: ESOPHAGOGASTRODUODENOSCOPY (EGD);  Surgeon: Malissa Hippo, MD;  Location: [facility];  Service: Endoscopy;  Laterality: N/A;  9:30   TUBAL LIGATION     Patient Active Problem List   Diagnosis Date Noted   Abdominal pain, epigastric 04/07/2019   Abnormal findings on diagnostic imaging of other abdominal regions, including retroperitoneum 04/07/2019    PCP: Nita Sells MD  REFERRING PROVIDER: Windle Guard, MD   REFERRING DIAG: M54.50 (ICD-10-CM) - Low back pain, unspecified   Rationale for Evaluation and Treatment: Rehabilitation  THERAPY DIAG:  Pain in left hip  Muscle weakness (generalized)  Difficulty in walking, not elsewhere classified  Other low back pain  ONSET DATE: exacerbation last few months  SUBJECTIVE:                                                                                                                                                                                            SUBJECTIVE STATEMENT: 4/11 Pt says she wants the needling again. Says the back has been tense the past couple days. Recently feels like her neck has flared up.   PERTINENT HISTORY:  COPD  PAIN:  Are you having pain? Yes: NPRS scale: current 6/10   Pain location: R mid/low back  Pain description: ache Aggravating factors: walking > 10 min Relieving factors: meloxicam and gabapentin, resting  PRECAUTIONS: None  RED FLAGS: None   WEIGHT BEARING RESTRICTIONS: No  FALLS:  Has patient  fallen in last 6 months?  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  DIAGNOSTIC FINDINGS:  Lumba\r MRI IMPRESSION: 1. At L3-4 there is a mild broad-based disc bulge with a left lateral annular fissure. Moderate bilateral facet arthropathy with ligamentum flavum infolding. Mild spinal stenosis. 2. At L4-5 there is a broad-based disc bulge with a right and left foraminal annular fissure. Severe bilateral facet arthropathy with ligamentum flavum infolding. Moderate spinal stenosis. 3. No acute osseous injury of the lumbar spine.  Hip x ray IMPRESSION: Mild degenerative changes in the right hip without loss of joint space.  PATIENT SURVEYS:  FOTO Primary score 39% with goal of 52% 10/08/23:55%  COGNITION: Overall cognitive status: Within functional limits for tasks assessed     SENSATION: WFL  MUSCLE LENGTH: Hamstrings: tightness bilaterally, limited toleration of testing in sitting   POSTURE: rounded shoulders, forward head, and weight shift left  PALPATION: Moderate TTP throughout cervical to mid thoracic then lumbar spine  paraspinals, traps, latissimus, glutes  LUMBAR ROM:   AROM eval 10/11/23  Flexion FT to below patellaP! Full No P! slight hs tightness  Extension Limited 50%P! full  Right lateral flexion Limited 90%P! full  Left lateral flexion Limited 75%P! full  Right rotation    Left rotation     (Blank rows = not tested)  LOWER EXTREMITY ROM:     WFL   LOWER EXTREMITY MMT:    MMT Right eval Left eval R / L 10/18/23  Hip flexion 26.2 41.0 32.3 / 50.8  Hip extension     Hip abduction 12.6 18.7 17.2 / 20.2  Hip adduction     Hip internal rotation     Hip external rotation     Knee flexion     Knee extension 16.0 20.3 20.7 / 26.0  Ankle dorsiflexion     Ankle plantarflexion     Ankle inversion     Ankle eversion      (Blank rows = not tested)  LUMBAR SPECIAL TESTS:  Straight leg raise test: Positive rle and Slump test: Positive right 10/18/23: Slump test neg right  FUNCTIONAL TESTS:  5 times sit to stand: Not tolerated Timed up and go (TUG): 16.99 4 stage balance: Passed 1&2; tandem x 4s; SLS x6s  10/18/23: TUG 13.67  GAIT: Distance walked: 400 ft Assistive device utilized: None Level of assistance: Complete Independence Comments: off loading left, guarded posture, slowed cadence  TREATMENT  4/11 Manual: All PROM performed with distraction to reduce pain and improve movement Trigger Point Dry Needling  Initial Treatment: Pt instructed on Dry Needling rational, procedures, and possible side effects. Pt instructed to expect mild to moderate muscle soreness later in the day and/or into the next day.  Pt instructed in methods to reduce muscle soreness. Pt instructed to continue prescribed HEP. Because Dry Needling was performed over or adjacent to a lung field, pt was educated on S/S of pneumothorax and to seek immediate medical attention should they occur.  Patient was educated on signs and symptoms of infection and other risk factors and advised to seek medical attention  should they occur.  Patient verbalized understanding of these instructions and education.  Patient Verbal Consent Given: Yes Education Handout Provided: Yes Muscles Treated: left upper right upper trap, right sub-occiptial Electrical Stimulation Performed: No Treatment Response/Outcome: great titch  DN R UT, suboccipitals STM UT, suboccipital release  Theracane STM to suboccipitals Skilled palpation of trigger points  There-ex: Nustep warm up   4/8  Manual: Trigger point release to gluteals and lower back   There-ex:  LTR 2x15  Gluteal stretch 3x20 sec hold bilateral   Nuero-re-ed   Treatment                            11/08/2023: Blank lines following charge title = not provided on this treatment date.   Manual:  TPDN No STM to thoracic ps and rhomboid/mid trap in prone STM to R cervical paraspinals, UT, LS There-ex: Rounded back stretch in seated  Quadruped cat stretch on elbows 5" 2x10 UTR and LTR x10 Review and update of HEP  Nuro-Re-ed: Dead bug with TA 2x10 Marches 90/90 2x10    Treatment                            11/05/2023: Blank lines following charge title = not provided on this treatment date.   Manual:  TPDN No STM to thoracic ps and rhomboid/mid trap in prone There-ex: Rounded back stretch in seated  Quadruped cat/cow 5" hold x10ea UTR and LTR 2x10  Nuro-Re-ed: Supine horizontal abduction RTB 2x10 Supine bilateral ER RTB 2x10 Dead bug with TA 2x10 Marches 90/90 2x10     Treatment                            11/01/2023: Blank lines following charge title = not provided on this treatment date.   Manual:  TPDN No  There-ex: L stretch at back of bike 10sec x5 Quadruped cat/cow 5" hold x5ea Quadruped thread needle x6ea UTR and LTR 2x10ea  There-Act:  Self Care:  Nuro-Re-ed: Supine horizontal abduction RTB 2x10 Supine bilateral ER RTB 2x10 Supine TA with bil UE raise using single 3# dumbbell x10 Dead bug with TA  2x10 Theraband row with GTB 2x10 Theraband extension with GTB 2x10     3/18 Manual:  DN Glute max and paraspinals STM glute max and paraspinals  There-ex:   There-Act  Self Care  Nuro-Re-ed   Machine rows 2x12 RPE 5 reviewed with posture and abdominal breathing  Hip abduction 2x10 40 lbs LF with abdominal breathing PRPE of 4    10/25/23 Pt seen for aquatic therapy today.  Treatment took place in water 3.5-4.75 ft in depth at the [facility] pool. Temp of water was 91.  Pt entered/exited the pool via stairs independently in step-to pattern with bilat rail.  - unsupported walking forward/ backward with cues for vertical trunk and reciprocal arm swingforward,  - side stepping with arm addct/ abdct without -> with rainbow hand floats  - farmers walk forward/ backward with single yellow hand float at side  - wide stance with UE horizontal add/abd using yellow hand floats x 10 --bow and arrow 3 x 5 - wall push up/ off x 12 - TrA set with hollow long noodle pull down to thighs, x 15, cues to slow speed  - hip hinge with forward arm reach, UE on noodle x 10 -decompression position with noodle wrapped posteriorly across chest:cycling  3/11 There-ex:  Ball roll x10 fwd 5 sec hold  5x lateral 5 second hold  Reviewed how to do at home with table  LTR x20  Glut stretch 2x20 sec hold but ad pain on the right   Reviewed how to use her HEP   Neruo re-ed  Ball squeeze 3x10  Supine march 2x10 with review of progression  Supine hip abduction red 3x10  Supine bridge 2x10   Reviwed TA breathing and the role of transverse abdominis in stabilization                                                                                      PATIENT EDUCATION:  Education details: aquatic therapy HEP instruction;  issued laminated copy Person educated: Patient Education method: Explanation Education comprehension: verbalized understanding  HOME EXERCISE PROGRAM: 09/16/23:  issued posture and body mechanics hand out  Access Code: [email or web address] URL: [email or web address] Date: 10/16/2023 Prepared by: Lorayne Bender  AQUATIC Access Code: [email or web address] URL: [email or web address] This aquatic home exercise program from [employer] utilizes pictures from land based exercises, but has been adapted prior to lamination and issuance.   ASSESSMENT:  CLINICAL IMPRESSION: 4/11 Pt warmed up on the nustep for with no increase in symptoms. Pt still verbalizes tightness in bilateral paraspinals and neck. TPDN was performed to areas noted in treatment. Pt received reduction in tightness and symptoms with manual performed today. Pt will continue to benefit from skilled physical therapy to decrease symptom irritability and increase functional endurance for ADL's.      From initial evaluation:  Patient is a 68 y.o. f who was seen today for physical therapy evaluation and treatment for LBP. She had a recent injection which improved her sciatic pain but LBP continues into right hip.  Main complaint is her inablility to walk her 2 small dogs on short path that includes a slight up hill area.  She also reports she seems to be getting weaker and she is afraid of falling.  She presents with deficits in LE strength, endurance, activity toleration and functional mobility with ADL's.  She will benefit from skilled PT to improve all deficits and return pt to ability to walk dogs as well as safely care for sick husband.  OBJECTIVE IMPAIRMENTS: Abnormal gait, decreased activity tolerance, decreased balance, decreased endurance, decreased mobility, difficulty walking, decreased ROM, decreased strength, impaired flexibility, postural dysfunction, and pain.   ACTIVITY LIMITATIONS: carrying, lifting, sleeping, transfers, hygiene/grooming, caring for others, and walking dogs  PARTICIPATION LIMITATIONS: cleaning, shopping, community activity, and yard work  PERSONAL FACTORS:  Age, Fitness, Past/current experiences, Time since onset of injury/illness/exacerbation, and 1-2 comorbidities: see PmHx  are also affecting patient's functional outcome.   REHAB POTENTIAL: Good  CLINICAL DECISION MAKING: Evolving/moderate complexity  EVALUATION COMPLEXITY: Moderate   GOALS: Goals reviewed with patient? Yes  SHORT TERM GOALS: Target date: 10/01/23  Pt will tolerate full aquatic sessions consistently without increase in pain and with improving function to demonstrate good toleration and effectiveness of intervention.  Baseline: Goal status: MET 10/04/23  2.  Pt will have 25% improvement in lumbar ROM without increase in pain Baseline: see chart Goal status: Met 10/11/23  3.  Pt will  complete 10 consecutive STS transfers from water bench onto water step without UE support or increase in pain Baseline:  Goal status: Met 10/04/23  4.  Pt will demonstrate improved toleration to activity by walking to and from setting, completing entire aquatic therapy session without reports of increased pain or fatigue Baseline:  Goal status:Met 10/04/23  5.  Pt will perform SLS and tandem stance holding x 15s submerged in 3.6 ft unsupported to demonstrate improvement in balance Baseline:  Goal status: Met 10/04/23  6.  Pt will report not waking at night consistently due to pain. Baseline: nightly Goal status: Met 10/04/23 improved toleration to sleeping in sup  7. Patient well increase gross bilateral lower extremity strength by 5 lbs  Goal status: 4/8 Therapy continues to progress strengthening   LONG TERM GOALS: Target date: 11/02/23  Pt will improve on Foto score by at least 8% points to reach MDC demonstrating improved perception of function Goal Baseline: 30% Goal status: Exceeded 10/08/23  2.  Pt will tolerate walking dogs usual path including uphill without reports of increased LBP. Baseline: extreme pain upon completion Goal status: Met 10/18/23  3.  Pt will improve  strength in all areas listed by at least 10 lbs to demonstrate improved overall physical function Baseline: see chart Goal status: in Progress 10/18/23  4.  Pt will improve on Tug test to <or= 13s to demonstrate improvement in lower extremity function, mobility and decreased fall risk. Baseline: 16.99 Goal status: In progress 10/18/23  5.  Pt will report decrease in "normal pain" by at least 3 NPRS for improved toleration to activity Baseline: 5/10 Goal status: Met for LB 10/18/23  6.  Pt will be indep with final HEP's (land and aquatic as appropriate) for continued management of condition Baseline: none Goal status: Partially met -10/25/23  PLAN:  PT FREQUENCY: 2x/week  PT DURATION: 8 weeks  PLANNED INTERVENTIONS: [id number]- PT Re-evaluation, 97110-Therapeutic exercises, 97530- Therapeutic activity, 97112- Neuromuscular re-education, 97535- Self Care, [id number]- Manual therapy, [phone]- Gait training, [phone]- Orthotic Fit/training, [phone]- Aquatic Therapy, [phone]- Electrical stimulation (unattended), Patient/Family education, Balance training, Stair training, Taping, Dry Needling, Joint mobilization, DME instructions, Cryotherapy, and Moist heat.  PLAN FOR NEXT SESSION: Land based for continued progression toward goals. Land consider DN  Lorayne Bender PT DPT Landry Corporal SPT 11/22/23 11:55 AM [facility] [address] [place], [place], [address] Phone: [phone]   Fax:  [phone]

## 2023-11-26 ENCOUNTER — Ambulatory Visit (HOSPITAL_BASED_OUTPATIENT_CLINIC_OR_DEPARTMENT_OTHER): Admitting: Physical Therapy

## 2023-11-26 ENCOUNTER — Encounter (HOSPITAL_BASED_OUTPATIENT_CLINIC_OR_DEPARTMENT_OTHER): Payer: Self-pay | Admitting: Physical Therapy

## 2023-11-26 DIAGNOSIS — M25552 Pain in left hip: Secondary | ICD-10-CM

## 2023-11-26 DIAGNOSIS — M5459 Other low back pain: Secondary | ICD-10-CM | POA: Diagnosis not present

## 2023-11-26 DIAGNOSIS — M6281 Muscle weakness (generalized): Secondary | ICD-10-CM

## 2023-11-26 DIAGNOSIS — R262 Difficulty in walking, not elsewhere classified: Secondary | ICD-10-CM

## 2023-11-26 NOTE — Therapy (Signed)
 OUTPATIENT PHYSICAL THERAPY THORACOLUMBAR TREATMENT      Patient Name: Monica Spence MRN: [id number] DOB:August 04, 1956, 68 y.o., female Today's Date: 11/26/2023  END OF SESSION:  PT End of Session - 11/26/23 1310     Visit Number 19    Number of Visits 28    Date for PT Re-Evaluation 12/25/23    Authorization Type [employer]    PT Start Time 1302    PT Stop Time 1345    PT Time Calculation (min) 43 min    Activity Tolerance Patient tolerated treatment well;Patient limited by fatigue    Behavior During Therapy [facility] for tasks assessed/performed                 Past Medical History:  Diagnosis Date   COPD (chronic obstructive pulmonary disease) (HCC)    PONV (postoperative nausea and vomiting)    Past Surgical History:  Procedure Laterality Date   APPENDECTOMY     BIOPSY  05/27/2019   Procedure: BIOPSY;  Surgeon: Malissa Hippo, MD;  Location: [facility];  Service: Endoscopy;;   COLONOSCOPY     ESOPHAGOGASTRODUODENOSCOPY N/A 05/27/2019   Procedure: ESOPHAGOGASTRODUODENOSCOPY (EGD);  Surgeon: Malissa Hippo, MD;  Location: [facility];  Service: Endoscopy;  Laterality: N/A;  9:30   TUBAL LIGATION     Patient Active Problem List   Diagnosis Date Noted   Abdominal pain, epigastric 04/07/2019   Abnormal findings on diagnostic imaging of other abdominal regions, including retroperitoneum 04/07/2019    PCP: Nita Sells MD  REFERRING PROVIDER: Windle Guard, MD   REFERRING DIAG: M54.50 (ICD-10-CM) - Low back pain, unspecified   Rationale for Evaluation and Treatment: Rehabilitation  THERAPY DIAG:  Pain in left hip  Muscle weakness (generalized)  Difficulty in walking, not elsewhere classified  Other low back pain  ONSET DATE: exacerbation last few months  SUBJECTIVE:                                                                                                                                                                                            SUBJECTIVE STATEMENT: 4/15 Pt reported there are a couple spots left in the neck that are still painful. Overall she feels that the TPDN helped out a lot last time. She went camping and her neck is hurting from that.   PERTINENT HISTORY:  COPD  PAIN:  Are you having pain? Yes: NPRS scale: current 6/10   Pain location: R mid/low back  Pain description: ache Aggravating factors: walking > 10 min Relieving factors: meloxicam and gabapentin, resting  PRECAUTIONS: None  RED FLAGS: None  WEIGHT BEARING RESTRICTIONS: No  FALLS:  Has patient fallen in last 6 months?  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  DIAGNOSTIC FINDINGS:  Lumba\r MRI IMPRESSION: 1. At L3-4 there is a mild broad-based disc bulge with a left lateral annular fissure. Moderate bilateral facet arthropathy with ligamentum flavum infolding. Mild spinal stenosis. 2. At L4-5 there is a broad-based disc bulge with a right and left foraminal annular fissure. Severe bilateral facet arthropathy with ligamentum flavum infolding. Moderate spinal stenosis. 3. No acute osseous injury of the lumbar spine.  Hip x ray IMPRESSION: Mild degenerative changes in the right hip without loss of joint space.  PATIENT SURVEYS:  FOTO Primary score 39% with goal of 52% 10/08/23:55%  COGNITION: Overall cognitive status: Within functional limits for tasks assessed     SENSATION: WFL  MUSCLE LENGTH: Hamstrings: tightness bilaterally, limited toleration of testing in sitting   POSTURE: rounded shoulders, forward head, and weight shift left  PALPATION: Moderate TTP  throughout cervical to mid thoracic then lumbar spine paraspinals, traps, latissimus, glutes  LUMBAR ROM:   AROM eval 10/11/23  Flexion FT to below patellaP! Full No P! slight hs tightness  Extension Limited 50%P! full  Right lateral flexion Limited 90%P! full  Left lateral flexion Limited 75%P! full  Right rotation    Left rotation     (Blank rows = not tested)  LOWER EXTREMITY ROM:     WFL   LOWER EXTREMITY MMT:    MMT Right eval Left eval R / L 10/18/23  Hip flexion 26.2 41.0 32.3 / 50.8  Hip extension     Hip abduction 12.6 18.7 17.2 / 20.2  Hip adduction     Hip internal rotation     Hip external rotation     Knee flexion     Knee extension 16.0 20.3 20.7 / 26.0  Ankle dorsiflexion     Ankle plantarflexion     Ankle inversion     Ankle eversion      (Blank rows = not tested)  LUMBAR SPECIAL TESTS:  Straight leg raise test: Positive rle and Slump test: Positive right 10/18/23: Slump test neg right  FUNCTIONAL TESTS:  5 times sit to stand: Not tolerated Timed up and go (TUG): 16.99 4 stage balance: Passed 1&2; tandem x 4s; SLS x6s  10/18/23: TUG 13.67  GAIT: Distance walked: 400 ft Assistive device utilized: None Level of assistance: Complete Independence Comments: off loading left, guarded posture, slowed cadence  TREATMENT  4/15 Manual:  Trigger point release to upper trap and cervical spine.  Trigger Point Dry Needling  Initial Treatment: Pt instructed on Dry Needling rational, procedures, and possible side effects. Pt instructed to expect mild to moderate muscle soreness later in the day and/or into the next day.  Pt instructed in methods to reduce muscle soreness. Pt instructed to continue prescribed HEP. Because Dry Needling was performed over or adjacent to a lung field, pt was educated on S/S of pneumothorax and to seek immediate medical attention should they occur.  Patient was educated on signs and symptoms of infection and other risk  factors and advised to seek medical attention should they occur.  Patient  verbalized understanding of these instructions and education.     Patient Verbal Consent Given: Yes Education Handout Provided: Yes Muscles Treated: right UT, right sub-occiptial Electrical Stimulation Performed: No Treatment Response/Outcome: great titch Neuro re-ed: Red RTB rows 3x10 Bicep curl 3x10 2lb Face pulls red RTB 3x10 There-Act     4/11 Manual: All PROM performed with distraction to reduce pain and improve movement Trigger Point Dry Needling  Initial Treatment: Pt instructed on Dry Needling rational, procedures, and possible side effects. Pt instructed to expect mild to moderate muscle soreness later in the day and/or into the next day.  Pt instructed in methods to reduce muscle soreness. Pt instructed to continue prescribed HEP. Because Dry Needling was performed over or adjacent to a lung field, pt was educated on S/S of pneumothorax and to seek immediate medical attention should they occur.  Patient was educated on signs and symptoms of infection and other risk factors and advised to seek medical attention should they occur.  Patient verbalized understanding of these instructions and education.   Patient Verbal Consent Given: Yes Education Handout Provided: Yes Muscles Treated: left upper right upper trap, right sub-occiptial Electrical Stimulation Performed: No Treatment Response/Outcome: great titch  DN R UT, suboccipitals STM UT, suboccipital release  Theracane STM to suboccipitals Skilled palpation of trigger points  There-ex: Nustep warm up   4/8  Manual: Trigger point release to gluteals and lower back   There-ex:  LTR 2x15  Gluteal stretch 3x20 sec hold bilateral   Nuero-re-ed   Treatment                            11/08/2023: Blank lines following charge title = not provided on this treatment date.   Manual:  TPDN No STM to thoracic ps and  rhomboid/mid trap in prone STM to R cervical paraspinals, UT, LS There-ex: Rounded back stretch in seated  Quadruped cat stretch on elbows 5" 2x10 UTR and LTR x10 Review and update of HEP  Nuro-Re-ed: Dead bug with TA 2x10 Marches 90/90 2x10    Treatment                            11/05/2023: Blank lines following charge title = not provided on this treatment date.   Manual:  TPDN No STM to thoracic ps and rhomboid/mid trap in prone There-ex: Rounded back stretch in seated  Quadruped cat/cow 5" hold x10ea UTR and LTR 2x10  Nuro-Re-ed: Supine horizontal abduction RTB 2x10 Supine bilateral ER RTB 2x10 Dead bug with TA 2x10 Marches 90/90 2x10     Treatment                            11/01/2023: Blank lines following charge title = not provided on this treatment date.   Manual:  TPDN No  There-ex: L stretch at back of bike 10sec x5 Quadruped cat/cow 5" hold x5ea Quadruped thread needle x6ea UTR and LTR 2x10ea  There-Act:  Self Care:  Nuro-Re-ed: Supine horizontal abduction RTB 2x10 Supine bilateral ER RTB 2x10 Supine TA with bil UE raise using single 3# dumbbell x10 Dead bug with TA 2x10 Theraband row with GTB 2x10 Theraband extension with GTB 2x10     3/18 Manual:  DN Glute max and paraspinals STM glute max and paraspinals  There-ex:   There-Act  Self Care  Nuro-Re-ed   Machine rows 2x12 RPE 5 reviewed with posture and abdominal breathing  Hip abduction 2x10 40 lbs LF with abdominal breathing PRPE of 4    10/25/23 Pt seen for aquatic therapy today.  Treatment took place in water 3.5-4.75 ft in depth at the [facility] pool. Temp of water was 91.  Pt entered/exited the pool via stairs independently in step-to pattern with bilat rail.  - unsupported walking forward/ backward with cues for vertical trunk and reciprocal arm swingforward,  - side stepping with arm addct/ abdct without -> with rainbow hand floats  - farmers walk  forward/ backward with single yellow hand float at side  - wide stance with UE horizontal add/abd using yellow hand floats x 10 --bow and arrow 3 x 5 - wall push up/ off x 12 - TrA set with hollow long noodle pull down to thighs, x 15, cues to slow speed  - hip hinge with forward arm reach, UE on noodle x 10 -decompression position with noodle wrapped posteriorly across chest:cycling  3/11 There-ex:  Ball roll x10 fwd 5 sec hold  5x lateral 5 second hold  Reviewed how to do at home with table  LTR x20  Glut stretch 2x20 sec hold but ad pain on the right   Reviewed how to use her HEP   Neruo re-ed  Ball squeeze 3x10  Supine march 2x10 with review of progression  Supine hip abduction red 3x10  Supine bridge 2x10   Reviwed TA breathing and the role of transverse abdominis in stabilization                                                                                      PATIENT EDUCATION:  Education details: aquatic therapy HEP instruction;  issued laminated copy Person educated: Patient Education method: Explanation Education comprehension: verbalized understanding  HOME EXERCISE PROGRAM: 09/16/23: issued posture and body mechanics hand out  Access Code: [email or web address] URL: [email or web address] Date: 10/16/2023 Prepared by: Lorayne Bender  AQUATIC Access Code: [email or web address] URL: [email or web address] This aquatic home exercise program from [employer] utilizes pictures from land based exercises, but has been adapted prior to lamination and issuance.   ASSESSMENT:  CLINICAL IMPRESSION: 4/15 Pt warmed up on the nustep for with no increase in symptoms. Pt verbalized neck has a couple spots left that are painful. TPDN was performed to areas noted in treatment. Pt received reduction in tightness and symptoms with TPDN and manual performed today. Neuro re-ed exercises were performed with postural cueing for posterior chain strengthening. Pt will continue  to benefit from skilled physical therapy to decrease symptom irritability and increase functional endurance for ADL's. Needling performed to upper trap and cervical spine to improve overall posture and endurance with standing. When the pain is bad she has to flex forward which is playing a role in her lumbar spine.      From initial evaluation:  Patient is a 68 y.o. f who was seen today for physical therapy evaluation and treatment for LBP. She had a recent injection which improved her sciatic pain but LBP continues into right hip.  Main complaint is her inablility to walk her 2  small dogs on short path that includes a slight up hill area.  She also reports she seems to be getting weaker and she is afraid of falling.  She presents with deficits in LE strength, endurance, activity toleration and functional mobility with ADL's.  She will benefit from skilled PT to improve all deficits and return pt to ability to walk dogs as well as safely care for sick husband.  OBJECTIVE IMPAIRMENTS: Abnormal gait, decreased activity tolerance, decreased balance, decreased endurance, decreased mobility, difficulty walking, decreased ROM, decreased strength, impaired flexibility, postural dysfunction, and pain.   ACTIVITY LIMITATIONS: carrying, lifting, sleeping, transfers, hygiene/grooming, caring for others, and walking dogs  PARTICIPATION LIMITATIONS: cleaning, shopping, community activity, and yard work  PERSONAL FACTORS: Age, Fitness, Past/current experiences, Time since onset of injury/illness/exacerbation, and 1-2 comorbidities: see PmHx  are also affecting patient's functional outcome.   REHAB POTENTIAL: Good  CLINICAL DECISION MAKING: Evolving/moderate complexity  EVALUATION COMPLEXITY: Moderate   GOALS: Goals reviewed with patient? Yes  SHORT TERM GOALS: Target date: 10/01/23  Pt will tolerate full aquatic sessions consistently without increase in pain and with improving function to demonstrate  good toleration and effectiveness of intervention.  Baseline: Goal status: MET 10/04/23  2.  Pt will have 25% improvement in lumbar ROM without increase in pain Baseline: see chart Goal status: Met 10/11/23  3.  Pt will complete 10 consecutive STS transfers from water bench onto water step without UE support or increase in pain Baseline:  Goal status: Met 10/04/23  4.  Pt will demonstrate improved toleration to activity by walking to and from setting, completing entire aquatic therapy session without reports of increased pain or fatigue Baseline:  Goal status:Met 10/04/23  5.  Pt will perform SLS and tandem stance holding x 15s submerged in 3.6 ft unsupported to demonstrate improvement in balance Baseline:  Goal status: Met 10/04/23  6.  Pt will report not waking at night consistently due to pain. Baseline: nightly Goal status: Met 10/04/23 improved toleration to sleeping in sup  7. Patient well increase gross bilateral lower extremity strength by 5 lbs  Goal status: 4/8 Therapy continues to progress strengthening   LONG TERM GOALS: Target date: 11/02/23  Pt will improve on Foto score by at least 8% points to reach MDC demonstrating improved perception of function Goal Baseline: 30% Goal status: Exceeded 10/08/23  2.  Pt will tolerate walking dogs usual path including uphill without reports of increased LBP. Baseline: extreme pain upon completion Goal status: Met 10/18/23  3.  Pt will improve strength in all areas listed by at least 10 lbs to demonstrate improved overall physical function Baseline: see chart Goal status: in Progress 10/18/23  4.  Pt will improve on Tug test to <or= 13s to demonstrate improvement in lower extremity function, mobility and decreased fall risk. Baseline: 16.99 Goal status: In progress 10/18/23  5.  Pt will report decrease in "normal pain" by at least 3 NPRS for improved toleration to activity Baseline: 5/10 Goal status: Met for LB 10/18/23  6.  Pt will  be indep with final HEP's (land and aquatic as appropriate) for continued management of condition Baseline: none Goal status: Partially met -10/25/23  PLAN:  PT FREQUENCY: 2x/week  PT DURATION: 8 weeks  PLANNED INTERVENTIONS: 97164- PT Re-evaluation, 97110-Therapeutic exercises, 97530- Therapeutic activity, 97112- Neuromuscular re-education, 97535- Self Care, 16109- Manual therapy, [phone]- Gait training, [phone]- Orthotic Fit/training, [phone]- Aquatic Therapy, [phone]- Electrical stimulation (unattended), Patient/Family education, Balance training, Stair training, Taping, Dry Needling, Joint  mobilization, DME instructions, Cryotherapy, and Moist heat.  PLAN FOR NEXT SESSION: Land based for continued progression toward goals. Land consider DN  Signa Drier PT DPT Katheran Palms SPT 11/26/23 1:50 PM [facility] [address] [place], [place], [address] Phone: [phone]   Fax:  [phone]  I have reviewed and concur with this student's documentation.   Kitty Perkins, PT 11/26/2023 3:49 PM   During this treatment session, the therapist was present, participating in and directing the treatment.

## 2023-12-03 ENCOUNTER — Encounter (HOSPITAL_BASED_OUTPATIENT_CLINIC_OR_DEPARTMENT_OTHER): Payer: Self-pay

## 2023-12-03 ENCOUNTER — Ambulatory Visit (HOSPITAL_BASED_OUTPATIENT_CLINIC_OR_DEPARTMENT_OTHER)

## 2023-12-03 DIAGNOSIS — M6281 Muscle weakness (generalized): Secondary | ICD-10-CM

## 2023-12-03 DIAGNOSIS — R262 Difficulty in walking, not elsewhere classified: Secondary | ICD-10-CM | POA: Diagnosis not present

## 2023-12-03 DIAGNOSIS — M5459 Other low back pain: Secondary | ICD-10-CM | POA: Diagnosis not present

## 2023-12-03 DIAGNOSIS — M25552 Pain in left hip: Secondary | ICD-10-CM | POA: Diagnosis not present

## 2023-12-03 NOTE — Therapy (Signed)
 OUTPATIENT PHYSICAL THERAPY THORACOLUMBAR TREATMENT      Patient Name: Monica Spence MRN: [id number] DOB:06/09/1956, 68 y.o., female Today's Date: 12/03/2023  END OF SESSION:  PT End of Session - 12/03/23 1134     Visit Number 20    Number of Visits 28    Date for PT Re-Evaluation 12/25/23    Authorization Type [employer]    PT Start Time 1103    PT Stop Time 1145    PT Time Calculation (min) 42 min    Activity Tolerance Patient tolerated treatment well    Behavior During Therapy WFL for tasks assessed/performed                  Past Medical History:  Diagnosis Date   COPD (chronic obstructive pulmonary disease) (HCC)    PONV (postoperative nausea and vomiting)    Past Surgical History:  Procedure Laterality Date   APPENDECTOMY     BIOPSY  05/27/2019   Procedure: BIOPSY;  Surgeon: Ruby Corporal, MD;  Location: [facility];  Service: Endoscopy;;   COLONOSCOPY     ESOPHAGOGASTRODUODENOSCOPY N/A 05/27/2019   Procedure: ESOPHAGOGASTRODUODENOSCOPY (EGD);  Surgeon: Ruby Corporal, MD;  Location: [facility];  Service: Endoscopy;  Laterality: N/A;  9:30   TUBAL LIGATION     Patient Active Problem List   Diagnosis Date Noted   Abdominal pain, epigastric 04/07/2019   Abnormal findings on diagnostic imaging of other abdominal regions, including retroperitoneum 04/07/2019    PCP: Denman Fischer MD  REFERRING PROVIDER: Michele Ahle, MD   REFERRING DIAG: M54.50 (ICD-10-CM) - Low back pain, unspecified   Rationale for Evaluation and Treatment: Rehabilitation  THERAPY DIAG:  Pain in left hip  Muscle weakness (generalized)  Difficulty in walking, not elsewhere classified  ONSET DATE: exacerbation last few months  SUBJECTIVE:                                                                                                                                                                                           SUBJECTIVE STATEMENT: Pt  reports the left side of her neck flared up this morning when she woke up. Unsure of cause. Has bene painful with certain neck movements.   PERTINENT HISTORY:  COPD  PAIN:  Are you having pain? Yes: NPRS scale: current 6/10   Pain location: R mid/low back  Pain description: ache Aggravating factors: walking > 10 min Relieving factors: meloxicam and gabapentin, resting  PRECAUTIONS: None  RED FLAGS: None   WEIGHT BEARING RESTRICTIONS: No  FALLS:  Has patient fallen in last 6 months?  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  DIAGNOSTIC FINDINGS:  Lumba\r MRI IMPRESSION: 1. At L3-4 there is a mild broad-based disc bulge with a left lateral annular fissure. Moderate bilateral facet arthropathy with ligamentum flavum infolding. Mild spinal stenosis. 2. At L4-5 there is a broad-based disc bulge with a right and left foraminal annular fissure. Severe bilateral facet arthropathy with ligamentum flavum infolding. Moderate spinal stenosis. 3. No acute osseous injury of the lumbar spine.  Hip x ray IMPRESSION: Mild degenerative changes in the right hip without loss of joint space.  PATIENT SURVEYS:  FOTO Primary score 39% with goal of 52% 10/08/23:55%  COGNITION: Overall cognitive status: Within functional limits for tasks assessed     SENSATION: WFL  MUSCLE LENGTH: Hamstrings: tightness bilaterally, limited toleration of testing in sitting   POSTURE: rounded shoulders, forward head, and weight shift left  PALPATION: Moderate TTP throughout cervical to mid thoracic then lumbar spine paraspinals, traps, latissimus,  glutes  LUMBAR ROM:   AROM eval 10/11/23  Flexion FT to below patellaP! Full No P! slight hs tightness  Extension Limited 50%P! full  Right lateral flexion Limited 90%P! full  Left lateral flexion Limited 75%P! full  Right rotation    Left rotation     (Blank rows = not tested)  LOWER EXTREMITY ROM:     WFL   LOWER EXTREMITY MMT:    MMT Right eval Left eval R / L 10/18/23  Hip flexion 26.2 41.0 32.3 / 50.8  Hip extension     Hip abduction 12.6 18.7 17.2 / 20.2  Hip adduction     Hip internal rotation     Hip external rotation     Knee flexion     Knee extension 16.0 20.3 20.7 / 26.0  Ankle dorsiflexion     Ankle plantarflexion     Ankle inversion     Ankle eversion      (Blank rows = not tested)  LUMBAR SPECIAL TESTS:  Straight leg raise test: Positive rle and Slump test: Positive right 10/18/23: Slump test neg right  FUNCTIONAL TESTS:  5 times sit to stand: Not tolerated Timed up and go (TUG): 16.99 4 stage balance: Passed 1&2; tandem x 4s; SLS x6s  10/18/23: TUG 13.67  GAIT: Distance walked: 400 ft Assistive device utilized: None Level of assistance: Complete Independence Comments: off loading left, guarded posture, slowed cadence  TREATMENT    4/22  -STM to upper traps, cervical ps, thoracic ps -Static cupping to L upper thorcic ps -UBE fwd, 2 min back -rhomboid stretch -Theraband row GTB 2x15 -Theraband extension GTB x15 (had some popping in biceps tendon)     4/15 Manual:  Trigger point release to upper trap and cervical spine.  Trigger Point Dry Needling  Initial Treatment: Pt instructed on Dry Needling rational, procedures, and possible side effects. Pt instructed to expect mild to moderate muscle soreness later in the day and/or into the next day.  Pt instructed in methods to reduce muscle soreness. Pt instructed to continue prescribed HEP. Because Dry Needling was performed over or adjacent to a lung field, pt was educated on  S/S of pneumothorax and to seek immediate medical attention should  they occur.  Patient was educated on signs and symptoms of infection and other risk factors and advised to seek medical attention should they occur.  Patient verbalized understanding of these instructions and education.     Patient Verbal Consent Given: Yes Education Handout Provided: Yes Muscles Treated: right UT, right sub-occiptial Electrical Stimulation Performed: No Treatment Response/Outcome: great titch Neuro re-ed: Red RTB rows 3x10 Bicep curl 3x10 2lb Face pulls red RTB 3x10 There-Act    4/11 Manual: All PROM performed with distraction to reduce pain and improve movement Trigger Point Dry Needling  Initial Treatment: Pt instructed on Dry Needling rational, procedures, and possible side effects. Pt instructed to expect mild to moderate muscle soreness later in the day and/or into the next day.  Pt instructed in methods to reduce muscle soreness. Pt instructed to continue prescribed HEP. Because Dry Needling was performed over or adjacent to a lung field, pt was educated on S/S of pneumothorax and to seek immediate medical attention should they occur.  Patient was educated on signs and symptoms of infection and other risk factors and advised to seek medical attention should they occur.  Patient verbalized understanding of these instructions and education.   Patient Verbal Consent Given: Yes Education Handout Provided: Yes Muscles Treated: left upper right upper trap, right sub-occiptial Electrical Stimulation Performed: No Treatment Response/Outcome: great titch  DN R UT, suboccipitals STM UT, suboccipital release  Theracane STM to suboccipitals Skilled palpation of trigger points  There-ex: Nustep warm up   4/8  Manual: Trigger point release to gluteals and lower back   There-ex:  LTR 2x15  Gluteal stretch 3x20 sec hold bilateral   Nuero-re-ed   Treatment                             11/08/2023: Blank lines following charge title = not provided on this treatment date.   Manual:  TPDN No STM to thoracic ps and rhomboid/mid trap in prone STM to R cervical paraspinals, UT, LS There-ex: Rounded back stretch in seated  Quadruped cat stretch on elbows 5" 2x10 UTR and LTR x10 Review and update of HEP  Nuro-Re-ed: Dead bug with TA 2x10 Marches 90/90 2x10                                                                                      PATIENT EDUCATION:  Education details: aquatic therapy HEP instruction;  issued laminated copy Person educated: Patient Education method: Explanation Education comprehension: verbalized understanding  HOME EXERCISE PROGRAM: 09/16/23: issued posture and body mechanics hand out  Access Code: [email or web address] URL: [email or web address] Date: 10/16/2023 Prepared by: Signa Drier  AQUATIC Access Code: [email or web address] URL: [email or web address] This aquatic home exercise program from [employer] utilizes pictures from land based exercises, but has been adapted prior to lamination and issuance.   ASSESSMENT:  CLINICAL IMPRESSION: Continued with STM and trigger point release with focus on left lower cervical and upper thoracic paraspinals.  Large hypersensitive trigger points noted.  Trialed cupping here as well.  Patient did report improvement following but still some soreness still present.  With postural strengthening  exercises patient did note "popping" in the anterior left shoulder.  Upon palpation reported tenderness throughout biceps and anterior deltoid region.  Instructed patient in self massage and tennis ball massage to this area.  Patient will be traveling the next few days.    From initial evaluation:  Patient is a 68 y.o. f who was seen today for physical therapy evaluation and treatment for LBP. She had a recent injection which improved her sciatic pain but LBP continues into right hip.  Main  complaint is her inablility to walk her 2 small dogs on short path that includes a slight up hill area.  She also reports she seems to be getting weaker and she is afraid of falling.  She presents with deficits in LE strength, endurance, activity toleration and functional mobility with ADL's.  She will benefit from skilled PT to improve all deficits and return pt to ability to walk dogs as well as safely care for sick husband.  OBJECTIVE IMPAIRMENTS: Abnormal gait, decreased activity tolerance, decreased balance, decreased endurance, decreased mobility, difficulty walking, decreased ROM, decreased strength, impaired flexibility, postural dysfunction, and pain.   ACTIVITY LIMITATIONS: carrying, lifting, sleeping, transfers, hygiene/grooming, caring for others, and walking dogs  PARTICIPATION LIMITATIONS: cleaning, shopping, community activity, and yard work  PERSONAL FACTORS: Age, Fitness, Past/current experiences, Time since onset of injury/illness/exacerbation, and 1-2 comorbidities: see PmHx  are also affecting patient's functional outcome.   REHAB POTENTIAL: Good  CLINICAL DECISION MAKING: Evolving/moderate complexity  EVALUATION COMPLEXITY: Moderate   GOALS: Goals reviewed with patient? Yes  SHORT TERM GOALS: Target date: 10/01/23  Pt will tolerate full aquatic sessions consistently without increase in pain and with improving function to demonstrate good toleration and effectiveness of intervention.  Baseline: Goal status: MET 10/04/23  2.  Pt will have 25% improvement in lumbar ROM without increase in pain Baseline: see chart Goal status: Met 10/11/23  3.  Pt will complete 10 consecutive STS transfers from water bench onto water step without UE support or increase in pain Baseline:  Goal status: Met 10/04/23  4.  Pt will demonstrate improved toleration to activity by walking to and from setting, completing entire aquatic therapy session without reports of increased pain or  fatigue Baseline:  Goal status:Met 10/04/23  5.  Pt will perform SLS and tandem stance holding x 15s submerged in 3.6 ft unsupported to demonstrate improvement in balance Baseline:  Goal status: Met 10/04/23  6.  Pt will report not waking at night consistently due to pain. Baseline: nightly Goal status: Met 10/04/23 improved toleration to sleeping in sup  7. Patient well increase gross bilateral lower extremity strength by 5 lbs  Goal status: 4/8 Therapy continues to progress strengthening   LONG TERM GOALS: Target date: 11/02/23  Pt will improve on Foto score by at least 8% points to reach MDC demonstrating improved perception of function Goal Baseline: 30% Goal status: Exceeded 10/08/23  2.  Pt will tolerate walking dogs usual path including uphill without reports of increased LBP. Baseline: extreme pain upon completion Goal status: Met 10/18/23  3.  Pt will improve strength in all areas listed by at least 10 lbs to demonstrate improved overall physical function Baseline: see chart Goal status: in Progress 10/18/23  4.  Pt will improve on Tug test to <or= 13s to demonstrate improvement in lower extremity function, mobility and decreased fall risk. Baseline: 16.99 Goal status: In progress 10/18/23  5.  Pt will report decrease in "normal pain" by at least 3 NPRS  for improved toleration to activity Baseline: 5/10 Goal status: Met for LB 10/18/23  6.  Pt will be indep with final HEP's (land and aquatic as appropriate) for continued management of condition Baseline: none Goal status: Partially met -10/25/23  PLAN:  PT FREQUENCY: 2x/week  PT DURATION: 8 weeks  PLANNED INTERVENTIONS: 97164- PT Re-evaluation, 97110-Therapeutic exercises, 97530- Therapeutic activity, 97112- Neuromuscular re-education, 97535- Self Care, [id number]- Manual therapy, [phone]- Gait training, [phone]- Orthotic Fit/training, [phone]- Aquatic Therapy, [phone]- Electrical stimulation (unattended), Patient/Family education,  Balance training, Stair training, Taping, Dry Needling, Joint mobilization, DME instructions, Cryotherapy, and Moist heat.  PLAN FOR NEXT SESSION: Land based for continued progression toward goals. Land consider DN   Judie Noun, PTA 12/03/2023 5:17 PM   During this treatment session, the therapist was present, participating in and directing the treatment.

## 2023-12-06 ENCOUNTER — Encounter (HOSPITAL_BASED_OUTPATIENT_CLINIC_OR_DEPARTMENT_OTHER): Admitting: Physical Therapy

## 2023-12-08 DIAGNOSIS — J189 Pneumonia, unspecified organism: Secondary | ICD-10-CM | POA: Diagnosis not present

## 2023-12-20 ENCOUNTER — Ambulatory Visit (HOSPITAL_BASED_OUTPATIENT_CLINIC_OR_DEPARTMENT_OTHER): Attending: Anesthesiology

## 2023-12-20 ENCOUNTER — Encounter (HOSPITAL_BASED_OUTPATIENT_CLINIC_OR_DEPARTMENT_OTHER): Payer: Self-pay

## 2023-12-20 DIAGNOSIS — R262 Difficulty in walking, not elsewhere classified: Secondary | ICD-10-CM | POA: Insufficient documentation

## 2023-12-20 DIAGNOSIS — M5459 Other low back pain: Secondary | ICD-10-CM | POA: Insufficient documentation

## 2023-12-20 DIAGNOSIS — M25552 Pain in left hip: Secondary | ICD-10-CM | POA: Diagnosis not present

## 2023-12-20 DIAGNOSIS — M6281 Muscle weakness (generalized): Secondary | ICD-10-CM | POA: Insufficient documentation

## 2023-12-20 NOTE — Therapy (Signed)
 OUTPATIENT PHYSICAL THERAPY THORACOLUMBAR TREATMENT      Patient Name: Monica Spence MRN: [id number] DOB:04-30-1956, 68 y.o., female Today's Date: 12/20/2023  END OF SESSION:  PT End of Session - 12/20/23 1458     Visit Number 21    Number of Visits 28    Date for PT Re-Evaluation 12/25/23    Authorization Type [employer]    PT Start Time 1356    PT Stop Time 1430    PT Time Calculation (min) 34 min    Activity Tolerance Patient tolerated treatment well    Behavior During Therapy WFL for tasks assessed/performed                   Past Medical History:  Diagnosis Date   COPD (chronic obstructive pulmonary disease) (HCC)    PONV (postoperative nausea and vomiting)    Past Surgical History:  Procedure Laterality Date   APPENDECTOMY     BIOPSY  05/27/2019   Procedure: BIOPSY;  Surgeon: Ruby Corporal, MD;  Location: [facility];  Service: Endoscopy;;   COLONOSCOPY     ESOPHAGOGASTRODUODENOSCOPY N/A 05/27/2019   Procedure: ESOPHAGOGASTRODUODENOSCOPY (EGD);  Surgeon: Ruby Corporal, MD;  Location: [facility];  Service: Endoscopy;  Laterality: N/A;  9:30   TUBAL LIGATION     Patient Active Problem List   Diagnosis Date Noted   Abdominal pain, epigastric 04/07/2019   Abnormal findings on diagnostic imaging of other abdominal regions, including retroperitoneum 04/07/2019    PCP: Denman Fischer MD  REFERRING PROVIDER: Michele Ahle, MD   REFERRING DIAG: M54.50 (ICD-10-CM) - Low back pain, unspecified   Rationale for Evaluation and Treatment: Rehabilitation  THERAPY DIAG:  Pain in left hip  Muscle weakness (generalized)  Difficulty in walking, not elsewhere classified  Other low back pain  ONSET DATE: exacerbation last few months  SUBJECTIVE:                                                                                                                                                                                            SUBJECTIVE STATEMENT: Pt reports she was recovering from pneumonia. Mid back has been bothering her more than shoulder. R side is more bothersome than left.   PERTINENT HISTORY:  COPD  PAIN:  Are you having pain? Yes: NPRS scale: current 6/10   Pain location: R mid/low back  Pain description: ache Aggravating factors: walking > 10 min Relieving factors: meloxicam and gabapentin, resting  PRECAUTIONS: None  RED FLAGS: None   WEIGHT BEARING RESTRICTIONS: No  FALLS:  Has patient fallen in last 6 months? 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  DIAGNOSTIC FINDINGS:  Lumba\r MRI IMPRESSION: 1. At L3-4 there is a mild broad-based disc bulge with a left lateral annular fissure. Moderate bilateral facet arthropathy with ligamentum flavum infolding. Mild spinal stenosis. 2. At L4-5 there is a broad-based disc bulge with a right and left foraminal annular fissure. Severe bilateral facet arthropathy with ligamentum flavum infolding. Moderate spinal stenosis. 3. No acute osseous injury of the lumbar spine.  Hip x ray IMPRESSION: Mild degenerative changes in the right hip without loss of joint space.  PATIENT SURVEYS:  FOTO Primary score 39% with goal of 52% 10/08/23:55%  COGNITION: Overall cognitive status: Within functional limits for tasks assessed     SENSATION: WFL  MUSCLE LENGTH: Hamstrings: tightness bilaterally, limited toleration of testing in sitting   POSTURE: rounded shoulders, forward head, and weight shift left  PALPATION: Moderate TTP throughout cervical to mid thoracic then lumbar spine paraspinals, traps,  latissimus, glutes  LUMBAR ROM:   AROM eval 10/11/23  Flexion FT to below patellaP! Full No P! slight hs tightness  Extension Limited 50%P! full  Right lateral flexion Limited 90%P! full  Left lateral flexion Limited 75%P! full  Right rotation    Left rotation     (Blank rows = not tested)  LOWER EXTREMITY ROM:     WFL   LOWER EXTREMITY MMT:    MMT Right eval Left eval R / L 10/18/23  Hip flexion 26.2 41.0 32.3 / 50.8  Hip extension     Hip abduction 12.6 18.7 17.2 / 20.2  Hip adduction     Hip internal rotation     Hip external rotation     Knee flexion     Knee extension 16.0 20.3 20.7 / 26.0  Ankle dorsiflexion     Ankle plantarflexion     Ankle inversion     Ankle eversion      (Blank rows = not tested)  LUMBAR SPECIAL TESTS:  Straight leg raise test: Positive rle and Slump test: Positive right 10/18/23: Slump test neg right  FUNCTIONAL TESTS:  5 times sit to stand: Not tolerated Timed up and go (TUG): 16.99 4 stage balance: Passed 1&2; tandem x 4s; SLS x6s  10/18/23: TUG 13.67  GAIT: Distance walked: 400 ft Assistive device utilized: None Level of assistance: Complete Independence Comments: off loading left, guarded posture, slowed cadence  TREATMENT  5/9  -STM to upper traps, cervical ps, thoracic ps -Static cupping to bil upper thorcic ps -cat stretch in quadruped  -UBE fwd, 2 min back L2.5 -rhomboid stretch seated -Theraband row GTB 2x15      4/22  -STM to upper traps, cervical ps, thoracic ps -Static cupping to L upper thorcic ps -UBE fwd, 2 min back -rhomboid stretch -Theraband row GTB 2x15 -Theraband extension GTB x15 (had some popping in biceps tendon)     4/15 Manual:  Trigger point release to upper trap and cervical spine.  Trigger Point Dry Needling  Initial Treatment: Pt instructed on Dry Needling rational, procedures, and possible side effects. Pt instructed to expect mild to moderate muscle soreness later  in the day and/or into the next day.  Pt instructed in methods to reduce muscle soreness. Pt instructed to continue prescribed HEP. Because Dry Needling was performed over or adjacent to a lung field, pt was educated on S/S of pneumothorax and to seek immediate medical attention should they occur.  Patient was educated on signs and symptoms of infection and other risk factors and advised to seek medical attention should they occur.  Patient verbalized understanding of these instructions and education.     Patient Verbal Consent Given: Yes Education Handout Provided: Yes Muscles Treated: right UT, right sub-occiptial Electrical Stimulation Performed: No Treatment Response/Outcome: great titch Neuro re-ed: Red RTB rows 3x10 Bicep curl 3x10 2lb Face pulls red RTB 3x10 There-Act    4/11 Manual: All PROM performed with distraction to reduce pain and improve movement Trigger Point Dry Needling  Initial Treatment: Pt instructed on Dry Needling rational, procedures, and possible side effects. Pt instructed to expect mild to moderate muscle soreness later in the day and/or into the next day.  Pt instructed in methods to reduce muscle soreness. Pt instructed to continue prescribed HEP. Because Dry Needling was performed over or adjacent to a lung field, pt was educated on S/S of pneumothorax and to seek immediate medical attention should they occur.  Patient was educated on signs and symptoms of infection and other risk factors and advised to seek medical attention should they occur.  Patient verbalized understanding of these instructions and education.   Patient Verbal Consent Given: Yes Education Handout Provided: Yes Muscles Treated: left upper right upper trap, right sub-occiptial Electrical Stimulation Performed: No Treatment Response/Outcome: great titch  DN R UT, suboccipitals STM UT, suboccipital release  Theracane STM to suboccipitals Skilled palpation of trigger  points  There-ex: Nustep warm up   4/8  Manual: Trigger point release to gluteals and lower back   There-ex:  LTR 2x15  Gluteal stretch 3x20 sec hold bilateral   Nuero-re-ed   Treatment                            11/08/2023: Blank lines following charge title = not provided on this treatment date.   Manual:  TPDN No STM to thoracic ps and rhomboid/mid trap in prone STM to R cervical paraspinals, UT, LS There-ex: Rounded back stretch in seated  Quadruped cat stretch on elbows 5" 2x10 UTR and LTR x10 Review and update of HEP  Nuro-Re-ed: Dead bug with TA 2x10 Marches 90/90 2x10                                                                                      PATIENT EDUCATION:  Education details: aquatic therapy HEP instruction;  issued laminated copy Person educated: Patient Education method: Explanation Education comprehension: verbalized understanding  HOME EXERCISE PROGRAM: 09/16/23: issued posture and body mechanics hand out  Access Code: [email or web address] URL: [email or web address] Date: 10/16/2023 Prepared by: Signa Drier  AQUATIC Access Code: [email or web address] URL: [email or web address] This aquatic home exercise program from [employer] utilizes pictures from land based exercises, but has been adapted prior to lamination and issuance.   ASSESSMENT:  CLINICAL IMPRESSION: Pt remains tight with palpable  trigger points on bilateral mid thoracic paraspinals. Significant improvement in this following STM and cupping techniques. Good tolerance for rhomboid stretching. She was educated on ways to self manage this tightness when out of clinic. Tx time was limited due to late arrival.     From initial evaluation:  Patient is a 68 y.o. f who was seen today for physical therapy evaluation and treatment for LBP. She had a recent injection which improved her sciatic pain but LBP continues into right hip.  Main complaint is her inablility to  walk her 2 small dogs on short path that includes a slight up hill area.  She also reports she seems to be getting weaker and she is afraid of falling.  She presents with deficits in LE strength, endurance, activity toleration and functional mobility with ADL's.  She will benefit from skilled PT to improve all deficits and return pt to ability to walk dogs as well as safely care for sick husband.  OBJECTIVE IMPAIRMENTS: Abnormal gait, decreased activity tolerance, decreased balance, decreased endurance, decreased mobility, difficulty walking, decreased ROM, decreased strength, impaired flexibility, postural dysfunction, and pain.   ACTIVITY LIMITATIONS: carrying, lifting, sleeping, transfers, hygiene/grooming, caring for others, and walking dogs  PARTICIPATION LIMITATIONS: cleaning, shopping, community activity, and yard work  PERSONAL FACTORS: Age, Fitness, Past/current experiences, Time since onset of injury/illness/exacerbation, and 1-2 comorbidities: see PmHx are also affecting patient's functional outcome.   REHAB POTENTIAL: Good  CLINICAL DECISION MAKING: Evolving/moderate complexity  EVALUATION COMPLEXITY: Moderate   GOALS: Goals reviewed with patient? Yes  SHORT TERM GOALS: Target date: 10/01/23  Pt will tolerate full aquatic sessions consistently without increase in pain and with improving function to demonstrate good toleration and effectiveness of intervention.  Baseline: Goal status: MET 10/04/23  2.  Pt will have 25% improvement in lumbar ROM without increase in pain Baseline: see chart Goal status: Met 10/11/23  3.  Pt will complete 10 consecutive STS transfers from water bench onto water step without UE support or increase in pain Baseline:  Goal status: Met 10/04/23  4.  Pt will demonstrate improved toleration to activity by walking to and from setting, completing entire aquatic therapy session without reports of increased pain or fatigue Baseline:  Goal status:Met  10/04/23  5.  Pt will perform SLS and tandem stance holding x 15s submerged in 3.6 ft unsupported to demonstrate improvement in balance Baseline:  Goal status: Met 10/04/23  6.  Pt will report not waking at night consistently due to pain. Baseline: nightly Goal status: Met 10/04/23 improved toleration to sleeping in sup  7. Patient well increase gross bilateral lower extremity strength by 5 lbs  Goal status: 4/8 Therapy continues to progress strengthening   LONG TERM GOALS: Target date: 11/02/23  Pt will improve on Foto score by at least 8% points to reach MDC demonstrating improved perception of function Goal Baseline: 30% Goal status: Exceeded 10/08/23  2.  Pt will tolerate walking dogs usual path including uphill without reports of increased LBP. Baseline: extreme pain upon completion Goal status: Met 10/18/23  3.  Pt will improve strength in all areas listed by at least 10 lbs to demonstrate improved overall physical function Baseline: see chart Goal status: in Progress 10/18/23  4.  Pt will improve on Tug test to <or= 13s to demonstrate improvement in lower extremity function, mobility and decreased fall risk. Baseline: 16.99 Goal status: In progress 10/18/23  5.  Pt will report decrease in "normal pain" by at least 3 NPRS  for improved toleration to activity Baseline: 5/10 Goal status: Met for LB 10/18/23  6.  Pt will be indep with final HEP's (land and aquatic as appropriate) for continued management of condition Baseline: none Goal status: Partially met -10/25/23  PLAN:  PT FREQUENCY: 2x/week  PT DURATION: 8 weeks  PLANNED INTERVENTIONS: [id number]- PT Re-evaluation, 97110-Therapeutic exercises, 97530- Therapeutic activity, 97112- Neuromuscular re-education, 97535- Self Care, [id number]- Manual therapy, [phone]- Gait training, [phone]- Orthotic Fit/training, [phone]- Aquatic Therapy, [phone]- Electrical stimulation (unattended), Patient/Family education, Balance training, Stair training, Taping,  Dry Needling, Joint mobilization, DME instructions, Cryotherapy, and Moist heat.  PLAN FOR NEXT SESSION: Land based for continued progression toward goals. Land consider DN   Judie Noun, PTA 12/20/2023 3:08 PM   During this treatment session, the therapist was present, participating in and directing the treatment.

## 2023-12-23 ENCOUNTER — Ambulatory Visit (HOSPITAL_BASED_OUTPATIENT_CLINIC_OR_DEPARTMENT_OTHER)

## 2023-12-24 DIAGNOSIS — M542 Cervicalgia: Secondary | ICD-10-CM | POA: Diagnosis not present

## 2023-12-24 DIAGNOSIS — M791 Myalgia, unspecified site: Secondary | ICD-10-CM | POA: Diagnosis not present

## 2023-12-24 DIAGNOSIS — M546 Pain in thoracic spine: Secondary | ICD-10-CM | POA: Diagnosis not present

## 2023-12-24 DIAGNOSIS — M5414 Radiculopathy, thoracic region: Secondary | ICD-10-CM | POA: Diagnosis not present

## 2024-01-01 ENCOUNTER — Ambulatory Visit (HOSPITAL_BASED_OUTPATIENT_CLINIC_OR_DEPARTMENT_OTHER): Admitting: Physical Therapy

## 2024-01-08 ENCOUNTER — Encounter (HOSPITAL_BASED_OUTPATIENT_CLINIC_OR_DEPARTMENT_OTHER): Payer: Self-pay | Admitting: Physical Therapy

## 2024-01-08 ENCOUNTER — Ambulatory Visit (HOSPITAL_BASED_OUTPATIENT_CLINIC_OR_DEPARTMENT_OTHER): Admitting: Physical Therapy

## 2024-01-08 DIAGNOSIS — M6281 Muscle weakness (generalized): Secondary | ICD-10-CM | POA: Diagnosis not present

## 2024-01-08 DIAGNOSIS — M5459 Other low back pain: Secondary | ICD-10-CM

## 2024-01-08 DIAGNOSIS — R262 Difficulty in walking, not elsewhere classified: Secondary | ICD-10-CM | POA: Diagnosis not present

## 2024-01-08 DIAGNOSIS — M25552 Pain in left hip: Secondary | ICD-10-CM

## 2024-01-08 NOTE — Therapy (Unsigned)
 OUTPATIENT PHYSICAL THERAPY THORACOLUMBAR TREATMENT      Patient Name: Monica Spence MRN: [id number] DOB:1955-08-22, 68 y.o., female Today's Date: 01/08/2024  END OF SESSION:  PT End of Session - 01/08/24 1306     Visit Number 21    Number of Visits 28    Date for PT Re-Evaluation 12/25/23    Authorization Type [employer]    PT Start Time 1303    PT Stop Time 1343    PT Time Calculation (min) 40 min    Activity Tolerance Patient tolerated treatment well    Behavior During Therapy WFL for tasks assessed/performed                   Past Medical History:  Diagnosis Date   COPD (chronic obstructive pulmonary disease) (HCC)    PONV (postoperative nausea and vomiting)    Past Surgical History:  Procedure Laterality Date   APPENDECTOMY     BIOPSY  05/27/2019   Procedure: BIOPSY;  Surgeon: Ruby Corporal, MD;  Location: [facility];  Service: Endoscopy;;   COLONOSCOPY     ESOPHAGOGASTRODUODENOSCOPY N/A 05/27/2019   Procedure: ESOPHAGOGASTRODUODENOSCOPY (EGD);  Surgeon: Ruby Corporal, MD;  Location: [facility];  Service: Endoscopy;  Laterality: N/A;  9:30   TUBAL LIGATION     Patient Active Problem List   Diagnosis Date Noted   Abdominal pain, epigastric 04/07/2019   Abnormal findings on diagnostic imaging of other abdominal regions, including retroperitoneum 04/07/2019    PCP: Denman Fischer MD  REFERRING PROVIDER: Michele Ahle, MD   REFERRING DIAG: M54.50 (ICD-10-CM) - Low back pain, unspecified   Rationale for Evaluation and Treatment: Rehabilitation  THERAPY DIAG:  Pain in left hip  Muscle weakness (generalized)  Difficulty in walking, not elsewhere classified  Other low back pain  ONSET DATE: exacerbation last few months  SUBJECTIVE:                                                                                                                                                                                            SUBJECTIVE STATEMENT: Pt reports she was recovering from pneumonia. Mid back has been bothering her more than shoulder. R side is more bothersome than left.   PERTINENT HISTORY:  COPD  PAIN:  Are you having pain? Yes: NPRS scale: current 6/10   Pain location: R mid/low back  Pain description: ache Aggravating factors: walking > 10 min Relieving factors: meloxicam and gabapentin, resting  PRECAUTIONS: None  RED FLAGS: None   WEIGHT BEARING RESTRICTIONS: No  FALLS:  Has patient fallen in last 6 months? 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  DIAGNOSTIC FINDINGS:  Lumba\r MRI IMPRESSION: 1. At L3-4 there is a mild broad-based disc bulge with a left lateral annular fissure. Moderate bilateral facet arthropathy with ligamentum flavum infolding. Mild spinal stenosis. 2. At L4-5 there is a broad-based disc bulge with a right and left foraminal annular fissure. Severe bilateral facet arthropathy with ligamentum flavum infolding. Moderate spinal stenosis. 3. No acute osseous injury of the lumbar spine.  Hip x ray IMPRESSION: Mild degenerative changes in the right hip without loss of joint space.  PATIENT SURVEYS:  FOTO Primary score 39% with goal of 52% 10/08/23:55%  COGNITION: Overall cognitive status: Within functional limits for tasks assessed     SENSATION: WFL  MUSCLE LENGTH: Hamstrings: tightness bilaterally, limited toleration of testing in sitting   POSTURE: rounded shoulders, forward head, and weight shift left  PALPATION: Moderate TTP throughout cervical to mid thoracic then lumbar spine paraspinals, traps,  latissimus, glutes  LUMBAR ROM:   AROM eval 10/11/23  Flexion FT to below patellaP! Full No P! slight hs tightness  Extension Limited 50%P! full  Right lateral flexion Limited 90%P! full  Left lateral flexion Limited 75%P! full  Right rotation    Left rotation     (Blank rows = not tested)  LOWER EXTREMITY ROM:     WFL   LOWER EXTREMITY MMT:    MMT Right eval Left eval R / L 10/18/23  Hip flexion 26.2 41.0 32.3 / 50.8  Hip extension     Hip abduction 12.6 18.7 17.2 / 20.2  Hip adduction     Hip internal rotation     Hip external rotation     Knee flexion     Knee extension 16.0 20.3 20.7 / 26.0  Ankle dorsiflexion     Ankle plantarflexion     Ankle inversion     Ankle eversion      (Blank rows = not tested)  LUMBAR SPECIAL TESTS:  Straight leg raise test: Positive rle and Slump test: Positive right 10/18/23: Slump test neg right  FUNCTIONAL TESTS:  5 times sit to stand: Not tolerated Timed up and go (TUG): 16.99 4 stage balance: Passed 1&2; tandem x 4s; SLS x6s  10/18/23: TUG 13.67  GAIT: Distance walked: 400 ft Assistive device utilized: None Level of assistance: Complete Independence Comments: off loading left, guarded posture, slowed cadence  TREATMENT  5/28  Trigger point release to upper trap and cervical spine.  Trigger Point Dry Needling  Initial Treatment: Pt instructed on Dry Needling rational, procedures, and possible side effects. Pt instructed to expect mild to moderate muscle soreness later in the day and/or into the next day.  Pt instructed in methods to reduce muscle soreness. Pt instructed to continue prescribed HEP. Because Dry Needling was performed over or adjacent to a lung field, pt was educated on S/S of pneumothorax and to seek immediate medical attention should they occur.  Patient was educated on signs and symptoms of infection and other risk factors and advised to seek medical attention should they occur.  Patient verbalized  understanding of these instructions and education.     Patient Verbal Consent Given: Yes Education Handout Provided:  Yes Muscles Treated: lower lumbar paraspinals L3-L5 3x each side with a .30x50 needle  Electrical Stimulation Performed: No Treatment Response/Outcome: great titch  Manual:  Skilled palpation of trigger points.  PA glides from L3 -L5  Trigger point release to bilateral paraspinals   There-ex:  LTR 2x10  Gluteal stretch 3x20 sec hold bilateral   Neuro-re-ed:  Supine march with progression 2x12  Supine hip abduction 2x12 red  Supine bridge 2x12      5/9  -STM to upper traps, cervical ps, thoracic ps -Static cupping to bil upper thorcic ps -cat stretch in quadruped  -UBE fwd, 2 min back L2.5 -rhomboid stretch seated -Theraband row GTB 2x15      4/22  -STM to upper traps, cervical ps, thoracic ps -Static cupping to L upper thorcic ps -UBE fwd, 2 min back -rhomboid stretch -Theraband row GTB 2x15 -Theraband extension GTB x15 (had some popping in biceps tendon)     4/15 Manual:  Trigger point release to upper trap and cervical spine.  Trigger Point Dry Needling  Initial Treatment: Pt instructed on Dry Needling rational, procedures, and possible side effects. Pt instructed to expect mild to moderate muscle soreness later in the day and/or into the next day.  Pt instructed in methods to reduce muscle soreness. Pt instructed to continue prescribed HEP. Because Dry Needling was performed over or adjacent to a lung field, pt was educated on S/S of pneumothorax and to seek immediate medical attention should they occur.  Patient was educated on signs and symptoms of infection and other risk factors and advised to seek medical attention should they occur.  Patient verbalized understanding of these instructions and education.     Patient Verbal Consent Given: Yes Education Handout Provided: Yes Muscles Treated: right UT, right  sub-occiptial Electrical Stimulation Performed: No Treatment Response/Outcome: great titch Neuro re-ed: Red RTB rows 3x10 Bicep curl 3x10 2lb Face pulls red RTB 3x10 There-Act    4/11 Manual: All PROM performed with distraction to reduce pain and improve movement Trigger Point Dry Needling  Initial Treatment: Pt instructed on Dry Needling rational, procedures, and possible side effects. Pt instructed to expect mild to moderate muscle soreness later in the day and/or into the next day.  Pt instructed in methods to reduce muscle soreness. Pt instructed to continue prescribed HEP. Because Dry Needling was performed over or adjacent to a lung field, pt was educated on S/S of pneumothorax and to seek immediate medical attention should they occur.  Patient was educated on signs and symptoms of infection and other risk factors and advised to seek medical attention should they occur.  Patient verbalized understanding of these instructions and education.   Patient Verbal Consent Given: Yes Education Handout Provided: Yes Muscles Treated: left upper right upper trap, right sub-occiptial Electrical Stimulation Performed: No Treatment Response/Outcome: great titch  DN R UT, suboccipitals STM UT, suboccipital release  Theracane STM to suboccipitals Skilled palpation of trigger points  There-ex: Nustep warm up   4/8  Manual: Trigger point release to gluteals and lower back   There-ex:  LTR 2x15  Gluteal stretch 3x20 sec hold bilateral   Nuero-re-ed   Treatment                            11/08/2023: Blank lines following charge title = not provided on this treatment date.   Manual:  TPDN No STM to thoracic ps and  rhomboid/mid trap in prone STM to R cervical paraspinals, UT, LS There-ex: Rounded back stretch in seated  Quadruped cat stretch on elbows 5" 2x10 UTR and LTR x10 Review and update of HEP  Nuro-Re-ed: Dead bug with TA 2x10 Marches 90/90  2x10                                                                                      PATIENT EDUCATION:  Education details: aquatic therapy HEP instruction;  issued laminated copy Person educated: Patient Education method: Explanation Education comprehension: verbalized understanding  HOME EXERCISE PROGRAM: 09/16/23: issued posture and body mechanics hand out  Access Code: [email or web address] URL: [email or web address] Date: 10/16/2023 Prepared by: Signa Drier  AQUATIC Access Code: [email or web address] URL: [email or web address] This aquatic home exercise program from [employer] utilizes pictures from land based exercises, but has been adapted prior to lamination and issuance.   ASSESSMENT:  CLINICAL IMPRESSION: The patient has reached max benefit from therapy at this time. She has had improvement with pain into the legs and overall mobility. She is still having difficulty with standing endurance. She is still having pain in the middle of her back.   From initial evaluation:  Patient is a 68 y.o. f who was seen today for physical therapy evaluation and treatment for LBP. She had a recent injection which improved her sciatic pain but LBP continues into right hip.  Main complaint is her inablility to walk her 2 small dogs on short path that includes a slight up hill area.  She also reports she seems to be getting weaker and she is afraid of falling.  She presents with deficits in LE strength, endurance, activity toleration and functional mobility with ADL's.  She will benefit from skilled PT to improve all deficits and return pt to ability to walk dogs as well as safely care for sick husband.  OBJECTIVE IMPAIRMENTS: Abnormal gait, decreased activity tolerance, decreased balance, decreased endurance, decreased mobility, difficulty walking, decreased ROM, decreased strength, impaired flexibility, postural dysfunction, and pain.   ACTIVITY LIMITATIONS: carrying, lifting,  sleeping, transfers, hygiene/grooming, caring for others, and walking dogs  PARTICIPATION LIMITATIONS: cleaning, shopping, community activity, and yard work  PERSONAL FACTORS: Age, Fitness, Past/current experiences, Time since onset of injury/illness/exacerbation, and 1-2 comorbidities: see PmHx are also affecting patient's functional outcome.   REHAB POTENTIAL: Good  CLINICAL DECISION MAKING: Evolving/moderate complexity  EVALUATION COMPLEXITY: Moderate   GOALS: Goals reviewed with patient? Yes  SHORT TERM GOALS: Target date: 10/01/23  Pt will tolerate full aquatic sessions consistently without increase in pain and with improving function to demonstrate good toleration and effectiveness of intervention.  Baseline: Goal status: MET 10/04/23  2.  Pt will have 25% improvement in lumbar ROM without increase in pain Baseline: see chart Goal status: Met 10/11/23  3.  Pt will complete 10 consecutive STS transfers from water bench onto water step without UE support or increase in pain Baseline:  Goal status: Met 10/04/23  4.  Pt will demonstrate improved toleration to activity by walking to and from setting, completing entire aquatic therapy session without reports of increased pain or fatigue Baseline:  Goal status:Met 10/04/23  5.  Pt will perform  SLS and tandem stance holding x 15s submerged in 3.6 ft unsupported to demonstrate improvement in balance Baseline:  Goal status: Met 10/04/23  6.  Pt will report not waking at night consistently due to pain. Baseline: nightly Goal status: Met 10/04/23 improved toleration to sleeping in sup  7. Patient well increase gross bilateral lower extremity strength by 5 lbs  Goal status: 4/8 Therapy continues to progress strengthening   LONG TERM GOALS: Target date: 11/02/23  Pt will improve on Foto score by at least 8% points to reach MDC demonstrating improved perception of function Goal Baseline: 30% Goal status: Exceeded 10/08/23  2.  Pt  will tolerate walking dogs usual path including uphill without reports of increased LBP. Baseline: extreme pain upon completion Goal status: Met 10/18/23  3.  Pt will improve strength in all areas listed by at least 10 lbs to demonstrate improved overall physical function Baseline: see chart Goal status: in Progress 10/18/23  4.  Pt will improve on Tug test to <or= 13s to demonstrate improvement in lower extremity function, mobility and decreased fall risk. Baseline: 16.99 Goal status: In progress 10/18/23  5.  Pt will report decrease in "normal pain" by at least 3 NPRS for improved toleration to activity Baseline: 5/10 Goal status: Met for LB 10/18/23  6.  Pt will be indep with final HEP's (land and aquatic as appropriate) for continued management of condition Baseline: none Goal status: Partially met -10/25/23  PLAN:  PT FREQUENCY: 2x/week  PT DURATION: 8 weeks  PLANNED INTERVENTIONS: 97164- PT Re-evaluation, 97110-Therapeutic exercises, 97530- Therapeutic activity, 97112- Neuromuscular re-education, 97535- Self Care, 59563- Manual therapy, [phone]- Gait training, [phone]- Orthotic Fit/training, [phone]- Aquatic Therapy, [phone]- Electrical stimulation (unattended), Patient/Family education, Balance training, Stair training, Taping, Dry Needling, Joint mobilization, DME instructions, Cryotherapy, and Moist heat.  PLAN FOR NEXT SESSION: Land based for continued progression toward goals. Land consider DN   Kitty Perkins, PT 01/08/2024 1:23 PM   During this treatment session, the therapist was present, participating in and directing the treatment.

## 2024-01-09 ENCOUNTER — Encounter (HOSPITAL_BASED_OUTPATIENT_CLINIC_OR_DEPARTMENT_OTHER): Payer: Self-pay | Admitting: Physical Therapy

## 2024-01-15 DIAGNOSIS — M5414 Radiculopathy, thoracic region: Secondary | ICD-10-CM | POA: Diagnosis not present

## 2024-01-24 DIAGNOSIS — M791 Myalgia, unspecified site: Secondary | ICD-10-CM | POA: Diagnosis not present

## 2024-01-24 DIAGNOSIS — M5414 Radiculopathy, thoracic region: Secondary | ICD-10-CM | POA: Diagnosis not present

## 2024-01-24 DIAGNOSIS — M542 Cervicalgia: Secondary | ICD-10-CM | POA: Diagnosis not present

## 2024-01-24 DIAGNOSIS — M546 Pain in thoracic spine: Secondary | ICD-10-CM | POA: Diagnosis not present

## 2024-02-04 DIAGNOSIS — M791 Myalgia, unspecified site: Secondary | ICD-10-CM | POA: Diagnosis not present

## 2024-03-25 ENCOUNTER — Encounter (HOSPITAL_COMMUNITY): Payer: Self-pay | Admitting: Family Medicine

## 2024-03-25 ENCOUNTER — Other Ambulatory Visit (HOSPITAL_COMMUNITY): Payer: Self-pay | Admitting: Family Medicine

## 2024-03-25 ENCOUNTER — Ambulatory Visit (HOSPITAL_COMMUNITY)
Admission: RE | Admit: 2024-03-25 | Discharge: 2024-03-25 | Disposition: A | Discharge status: Home / Self Care | Source: Ambulatory Visit | Attending: Family Medicine | Admitting: Family Medicine

## 2024-03-25 DIAGNOSIS — K30 Functional dyspepsia: Secondary | ICD-10-CM | POA: Diagnosis not present

## 2024-03-25 DIAGNOSIS — E785 Hyperlipidemia, unspecified: Secondary | ICD-10-CM | POA: Diagnosis not present

## 2024-03-25 DIAGNOSIS — R0789 Other chest pain: Secondary | ICD-10-CM | POA: Diagnosis not present

## 2024-03-25 DIAGNOSIS — Z7951 Long term (current) use of inhaled steroids: Secondary | ICD-10-CM | POA: Diagnosis not present

## 2024-03-25 DIAGNOSIS — Z87891 Personal history of nicotine dependence: Secondary | ICD-10-CM | POA: Diagnosis not present

## 2024-03-25 DIAGNOSIS — J449 Chronic obstructive pulmonary disease, unspecified: Secondary | ICD-10-CM | POA: Diagnosis not present

## 2024-03-25 DIAGNOSIS — K219 Gastro-esophageal reflux disease without esophagitis: Secondary | ICD-10-CM | POA: Diagnosis not present

## 2024-03-25 DIAGNOSIS — R079 Chest pain, unspecified: Secondary | ICD-10-CM | POA: Diagnosis not present

## 2024-04-06 ENCOUNTER — Encounter (HOSPITAL_COMMUNITY): Payer: Self-pay

## 2024-04-06 ENCOUNTER — Other Ambulatory Visit (HOSPITAL_COMMUNITY)

## 2024-04-15 DIAGNOSIS — E785 Hyperlipidemia, unspecified: Secondary | ICD-10-CM | POA: Diagnosis not present

## 2024-04-15 DIAGNOSIS — K219 Gastro-esophageal reflux disease without esophagitis: Secondary | ICD-10-CM | POA: Diagnosis not present

## 2024-04-15 DIAGNOSIS — R0789 Other chest pain: Secondary | ICD-10-CM | POA: Diagnosis not present

## 2024-04-15 DIAGNOSIS — Z79899 Other long term (current) drug therapy: Secondary | ICD-10-CM | POA: Diagnosis not present

## 2024-04-17 ENCOUNTER — Ambulatory Visit (HOSPITAL_COMMUNITY)
Admission: RE | Admit: 2024-04-17 | Discharge: 2024-04-17 | Disposition: A | Discharge status: Home / Self Care | Payer: Self-pay | Source: Ambulatory Visit | Attending: Family Medicine | Admitting: Family Medicine

## 2024-04-17 DIAGNOSIS — E785 Hyperlipidemia, unspecified: Secondary | ICD-10-CM | POA: Insufficient documentation

## 2024-04-21 ENCOUNTER — Emergency Department (HOSPITAL_COMMUNITY)

## 2024-04-21 ENCOUNTER — Other Ambulatory Visit: Payer: Self-pay

## 2024-04-21 ENCOUNTER — Encounter (HOSPITAL_COMMUNITY): Payer: Self-pay | Admitting: *Deleted

## 2024-04-21 ENCOUNTER — Emergency Department (HOSPITAL_COMMUNITY)
Admission: EM | Admit: 2024-04-21 | Discharge: 2024-04-21 | Discharge status: Against Medical Advice | Source: Home / Self Care | Attending: Emergency Medicine | Admitting: Emergency Medicine

## 2024-04-21 DIAGNOSIS — I25110 Atherosclerotic heart disease of native coronary artery with unstable angina pectoris: Secondary | ICD-10-CM | POA: Diagnosis not present

## 2024-04-21 DIAGNOSIS — R0789 Other chest pain: Secondary | ICD-10-CM | POA: Diagnosis not present

## 2024-04-21 DIAGNOSIS — R079 Chest pain, unspecified: Secondary | ICD-10-CM | POA: Insufficient documentation

## 2024-04-21 DIAGNOSIS — R911 Solitary pulmonary nodule: Secondary | ICD-10-CM | POA: Diagnosis not present

## 2024-04-21 DIAGNOSIS — Z5321 Procedure and treatment not carried out due to patient leaving prior to being seen by health care provider: Secondary | ICD-10-CM | POA: Insufficient documentation

## 2024-04-21 DIAGNOSIS — I200 Unstable angina: Secondary | ICD-10-CM | POA: Diagnosis not present

## 2024-04-21 LAB — I-STAT CHEM 8, ED
BUN: 14 mg/dL (ref 8–23)
Calcium, Ion: 1.13 mmol/L — ABNORMAL LOW (ref 1.15–1.40)
Chloride: 102 mmol/L (ref 98–111)
Creatinine, Ser: 0.8 mg/dL (ref 0.44–1.00)
Glucose, Bld: 107 mg/dL — ABNORMAL HIGH (ref 70–99)
HCT: 39 % (ref 36.0–46.0)
Hemoglobin: 13.3 g/dL (ref 12.0–15.0)
Potassium: 3.7 mmol/L (ref 3.5–5.1)
Sodium: 139 mmol/L (ref 135–145)
TCO2: 25 mmol/L (ref 22–32)

## 2024-04-21 LAB — BASIC METABOLIC PANEL WITH GFR
Anion gap: 12 (ref 5–15)
BUN: 13 mg/dL (ref 8–23)
CO2: 24 mmol/L (ref 22–32)
Calcium: 9.4 mg/dL (ref 8.9–10.3)
Chloride: 102 mmol/L (ref 98–111)
Creatinine, Ser: 0.8 mg/dL (ref 0.44–1.00)
GFR, Estimated: >60 mL/min (ref >60)
Glucose, Bld: 107 mg/dL — ABNORMAL HIGH (ref 70–99)
Potassium: 3.6 mmol/L (ref 3.5–5.1)
Sodium: 138 mmol/L (ref 135–145)

## 2024-04-21 LAB — CBC
HCT: 39.6 % (ref 36.0–46.0)
Hemoglobin: 12.9 g/dL (ref 12.0–15.0)
MCH: 29.3 pg (ref 26.0–34.0)
MCHC: 32.6 g/dL (ref 30.0–36.0)
MCV: 89.8 fL (ref 80.0–100.0)
Platelets: 247 K/uL (ref 150–400)
RBC: 4.41 MIL/uL (ref 3.87–5.11)
RDW: 12.9 % (ref 11.5–15.5)
WBC: 8.5 K/uL (ref 4.0–10.5)
nRBC: 0 % (ref 0.0–0.2)

## 2024-04-21 LAB — TROPONIN I (HIGH SENSITIVITY)
Troponin I (High Sensitivity): 4 ng/L (ref <18)
Troponin I (High Sensitivity): 4 ng/L (ref <18)

## 2024-04-21 NOTE — ED Notes (Signed)
 Pt did not respond to name and search of waiting room.

## 2024-04-21 NOTE — ED Provider Notes (Signed)
 Patient was not in waiting room when called and did not make it to room.  I did not assess this patient.  Was evaluated by EDP in triage for chest pain, shortness of breath that has been occurring for the past month with pain to bilateral shoulders radiating to her chest.  Upon lab work review, appears reassuring with troponin negative x 2.  No severe electrolyte abnormalities.  Vital signs appear stable.   Minnie Tinnie BRAVO, PA 04/21/24 2151    Dreama Longs, MD 04/22/24 1210

## 2024-04-21 NOTE — ED Triage Notes (Signed)
 Pt is here for chest pain x1 month, increases with activity.  No sob at this time

## 2024-04-21 NOTE — ED Notes (Signed)
 EKG DONE

## 2024-04-21 NOTE — ED Provider Triage Note (Signed)
 Emergency Medicine Provider Triage Evaluation Note  Monica Spence , a 68 y.o. female  was evaluated in triage.  Pt complains of chest pain, shortness of breath this been ongoing for the past month.  Feels pain to bilateral shoulders radiating to her chest.  Exacerbated and more often recently, but that started about a month ago.  No underlying history of high blood pressure diabetes or tobacco use.  Has not taken any medication for improvement in symptoms.  Review of Systems  Positive: Chest pain, shortness of breath Negative: Fever, cough  Physical Exam  BP 123/81 (BP Location: Right Arm)   Pulse 72   Temp 98.1 F (36.7 C)   Resp 17   SpO2 100%  Gen:   Awake, no distress   Resp:  Normal effort  MSK:   Moves extremities without difficulty  Other:    Medical Decision Making  Medically screening exam initiated at 12:20 PM.  Appropriate orders placed.  Otilia Kareem was informed that the remainder of the evaluation will be completed by another provider, this initial triage assessment does not replace that evaluation, and the importance of remaining in the ED until their evaluation is complete.     Bao Bazen, PA-C 04/21/24 1223

## 2024-04-22 ENCOUNTER — Other Ambulatory Visit: Payer: Self-pay

## 2024-04-22 ENCOUNTER — Emergency Department (HOSPITAL_BASED_OUTPATIENT_CLINIC_OR_DEPARTMENT_OTHER): Admitting: Radiology

## 2024-04-22 ENCOUNTER — Telehealth: Payer: Self-pay | Admitting: Cardiovascular Disease

## 2024-04-22 ENCOUNTER — Inpatient Hospital Stay (HOSPITAL_BASED_OUTPATIENT_CLINIC_OR_DEPARTMENT_OTHER)
Admission: EM | Admit: 2024-04-22 | Discharge: 2024-04-25 | DRG: 322 | Disposition: A | Discharge status: Home / Self Care | Attending: Cardiology | Admitting: Cardiology

## 2024-04-22 DIAGNOSIS — Z87891 Personal history of nicotine dependence: Secondary | ICD-10-CM

## 2024-04-22 DIAGNOSIS — J449 Chronic obstructive pulmonary disease, unspecified: Secondary | ICD-10-CM | POA: Diagnosis present

## 2024-04-22 DIAGNOSIS — E7800 Pure hypercholesterolemia, unspecified: Secondary | ICD-10-CM | POA: Diagnosis present

## 2024-04-22 DIAGNOSIS — Z8249 Family history of ischemic heart disease and other diseases of the circulatory system: Secondary | ICD-10-CM

## 2024-04-22 DIAGNOSIS — Z712 Person consulting for explanation of examination or test findings: Secondary | ICD-10-CM

## 2024-04-22 DIAGNOSIS — Z7982 Long term (current) use of aspirin: Secondary | ICD-10-CM

## 2024-04-22 DIAGNOSIS — R0789 Other chest pain: Secondary | ICD-10-CM | POA: Diagnosis not present

## 2024-04-22 DIAGNOSIS — Z7902 Long term (current) use of antithrombotics/antiplatelets: Secondary | ICD-10-CM

## 2024-04-22 DIAGNOSIS — J40 Bronchitis, not specified as acute or chronic: Secondary | ICD-10-CM | POA: Diagnosis not present

## 2024-04-22 DIAGNOSIS — Z955 Presence of coronary angioplasty implant and graft: Secondary | ICD-10-CM

## 2024-04-22 DIAGNOSIS — I25110 Atherosclerotic heart disease of native coronary artery with unstable angina pectoris: Principal | ICD-10-CM | POA: Diagnosis present

## 2024-04-22 DIAGNOSIS — I200 Unstable angina: Principal | ICD-10-CM | POA: Diagnosis present

## 2024-04-22 DIAGNOSIS — R079 Chest pain, unspecified: Secondary | ICD-10-CM | POA: Diagnosis not present

## 2024-04-22 DIAGNOSIS — Z79899 Other long term (current) drug therapy: Secondary | ICD-10-CM

## 2024-04-22 LAB — BASIC METABOLIC PANEL WITH GFR
Anion gap: 14 (ref 5–15)
BUN: 16 mg/dL (ref 8–23)
CO2: 24 mmol/L (ref 22–32)
Calcium: 10.1 mg/dL (ref 8.9–10.3)
Chloride: 104 mmol/L (ref 98–111)
Creatinine, Ser: 0.75 mg/dL (ref 0.44–1.00)
GFR, Estimated: >60 mL/min (ref >60)
Glucose, Bld: 97 mg/dL (ref 70–99)
Potassium: 4 mmol/L (ref 3.5–5.1)
Sodium: 141 mmol/L (ref 135–145)

## 2024-04-22 LAB — CBC
HCT: 39.1 % (ref 36.0–46.0)
Hemoglobin: 13.3 g/dL (ref 12.0–15.0)
MCH: 29.8 pg (ref 26.0–34.0)
MCHC: 34 g/dL (ref 30.0–36.0)
MCV: 87.7 fL (ref 80.0–100.0)
Platelets: 251 K/uL (ref 150–400)
RBC: 4.46 MIL/uL (ref 3.87–5.11)
RDW: 13.2 % (ref 11.5–15.5)
WBC: 9.1 K/uL (ref 4.0–10.5)
nRBC: 0 % (ref 0.0–0.2)

## 2024-04-22 LAB — TROPONIN T, HIGH SENSITIVITY
Troponin T High Sensitivity: <15 ng/L (ref 0–19)
Troponin T High Sensitivity: <15 ng/L (ref 0–19)

## 2024-04-22 MED ORDER — ASPIRIN 81 MG PO CHEW
324.0000 mg | CHEWABLE_TABLET | Freq: Once | ORAL | Status: AC
  Administered 2024-04-22: 324 mg via ORAL
  Filled 2024-04-22: qty 4

## 2024-04-22 NOTE — Telephone Encounter (Signed)
   Pt c/o of Chest Pain: STAT if active CP, including tightness, pressure, jaw pain, radiating pain to shoulder/upper arm/back, CP unrelieved by Nitro. Symptoms reported of SOB, nausea, vomiting, sweating.  1. Are you having CP right now? No    2. Are you experiencing any other symptoms (ex. SOB, nausea, vomiting, sweating)? Arm pain and tingling, pain between shoulders, pain down left arm   3. Is your CP continuous or coming and going? Coming and going    4. Have you taken Nitroglycerin ? No   5. How long have you been experiencing CP? About a month, more severe past few days    6. If NO CP at time of call then end call with telling Pt to call back or call 911 if Chest pain returns prior to return call from triage team.

## 2024-04-22 NOTE — Telephone Encounter (Signed)
 Her symptoms are concerning for cardiac chest pain. She also has multiple cardiovascular risk factors (postmenopausal female, severe coronary artery calcification, former smoker, COPD, age). Review of electronic medical records illustrates that she did go to the ER yesterday but likely left before being fully evaluated. I would favor that she goes back to the ED today as unstable angina (ACS) cannot be ruled out.  Letrell Attwood Clayton, DO, FACC

## 2024-04-22 NOTE — Telephone Encounter (Signed)
 Spoke with pt regarding her symptoms. Pt has been having chest pain and pressure that radiates down her left arm and tingles as well as pain between her shoulders that comes and goes. Pt stated this only happens when she is exerting herself such as walking her dog or doing the dishes. Pt stated she has been having worsening chest pain over the last couple days. The frequency has not increased but the pain is worse. Pt denied having chest pain while speaking to her on the phone. Pt was told that is she begins to have chest pain again that she will need to go to the ED. Pt was scheduled to see Dr. Michele tomorrow 9/11. Pt verbalized understanding. All questions if any were answered.

## 2024-04-22 NOTE — ED Triage Notes (Signed)
 Pt POV reporting L side chest pain past few weeks, radiates to back, denies SOB, seen in ED yesterday but left due to wait times.

## 2024-04-22 NOTE — ED Provider Notes (Signed)
 [place] EMERGENCY DEPARTMENT AT [facility] Provider Note   CSN: [id number] Arrival date & time: 04/22/24  1941     Patient presents with: Chest Pain   Monica Spence is a 68 y.o. female.    Chest Pain    Patient has a history of COPD who presents ED for evaluation of chest pain.  Patient states she has been having trouble with exercise-induced chest pain for the last couple of weeks.  Whenever she goes for a walk she is having substernal chest pain that is rating to her arm.  Patient went to see her primary care doctor.  She had laboratory testing and also underwent a CT coronary calcium  score.  Patient called the cardiologist office today and instructed to come to the ED.  Patient is not having any chest pain now.  The last episode was yesterday  Prior to Admission medications   Medication Sig Start Date End Date Taking? Authorizing Provider  alum & mag hydroxide-simeth (MAALOX/MYLANTA) 200-200-20 MG/5ML suspension Take 30 mLs by mouth every 6 (six) hours as needed for indigestion or heartburn.    [provider]  bismuth-metronidazole-tetracycline (PYLERA ) 140-125-125 MG capsule Take 3 capsules by mouth 4 (four) times daily -  before meals and at bedtime. 06/01/19   Rehman, Claudis PENNER, MD  dicyclomine  (BENTYL ) 10 MG capsule Take 1 capsule (10 mg total) by mouth 3 (three) times daily as needed for spasms (for abdominal pain). 04/07/19   Kennedy-Smith, Colleen M, NP  HYDROcodone -acetaminophen  (NORCO/VICODIN) 5-325 MG tablet Take 1 tablet by mouth every 6 (six) hours as needed. Patient taking differently: Take 1 tablet by mouth every 6 (six) hours as needed for moderate pain.  04/04/19   Zackowski, Scott, MD  ondansetron  (ZOFRAN  ODT) 4 MG disintegrating tablet Take 1 tablet (4 mg total) by mouth every 8 (eight) hours as needed. 04/07/19   Kennedy-Smith, Colleen M, NP  pantoprazole  (PROTONIX ) 40 MG tablet Take 1 tablet (40 mg total) by mouth daily before breakfast.  05/27/19   Rehman, Claudis PENNER, MD  TRELEGY ELLIPTA 100-62.5-25 MCG/INH AEPB Take 1 puff by mouth daily. 10/27/18   [provider]  VENTOLIN HFA 108 (90 Base) MCG/ACT inhaler Take 1-2 puffs by mouth daily. Inhale 1-2 puffs by mouth every 4 to 6 hours as needed for shortness of breath or wheezing 10/17/18   [provider]    Allergies: Patient has no known allergies.    Review of Systems  Cardiovascular:  Positive for chest pain.    Updated Vital Signs BP (!) 132/91   Pulse 68   Temp 98.5 F (36.9 C) (Oral)   Resp 12   Ht 1.6 m (5' 3)   Wt 51.3 kg   SpO2 98%   BMI 20.02 kg/m   Physical Exam Vitals and nursing note reviewed.  Constitutional:      General: She is not in acute distress.    Appearance: She is well-developed.  HENT:     Head: Normocephalic and atraumatic.     Right Ear: External ear normal.     Left Ear: External ear normal.  Eyes:     General: No scleral icterus.       Right eye: No discharge.        Left eye: No discharge.     Conjunctiva/sclera: Conjunctivae normal.  Neck:     Trachea: No tracheal deviation.  Cardiovascular:     Rate and Rhythm: Normal rate and regular rhythm.  Pulmonary:  Effort: Pulmonary effort is normal. No respiratory distress.     Breath sounds: Normal breath sounds. No stridor. No wheezing or rales.  Abdominal:     General: Bowel sounds are normal. There is no distension.     Palpations: Abdomen is soft.     Tenderness: There is no abdominal tenderness. There is no guarding or rebound.  Musculoskeletal:        General: No tenderness or deformity.     Cervical back: Neck supple.  Skin:    General: Skin is warm and dry.     Findings: No rash.  Neurological:     General: No focal deficit present.     Mental Status: She is alert.     Cranial Nerves: No cranial nerve deficit, dysarthria or facial asymmetry.     Sensory: No sensory deficit.     Motor: No abnormal muscle tone or seizure activity.      Coordination: Coordination normal.  Psychiatric:        Mood and Affect: Mood normal.     (all labs ordered are listed, but only abnormal results are displayed) Labs Reviewed  BASIC METABOLIC PANEL WITH GFR  CBC  TROPONIN T, HIGH SENSITIVITY  TROPONIN T, HIGH SENSITIVITY    EKG: Documented in muse  Radiology: DG Chest 2 View Result Date: 04/22/2024 CLINICAL DATA:  Chest pain EXAM: CHEST - 2 VIEW COMPARISON:  04/21/2024, 03/25/2024 FINDINGS: Bronchitic changes without focal airspace opacity, pleural effusion or pneumothorax. Slightly diminished nodular opacity in the right upper lung. Normal cardiac size. No pneumothorax IMPRESSION: No active cardiopulmonary disease. Bronchitic changes. Electronically Signed   By: Luke Bun M.D.   On: 04/22/2024 20:09   DG Chest 2 View Result Date: 04/21/2024 EXAM: 2 VIEW(S) XRAY OF THE CHEST 04/21/2024 01:03:51 PM COMPARISON: 03/25/2024 CLINICAL HISTORY: Chest pain x1 month, increases with activity. No SOB at this time. FINDINGS: LUNGS AND PLEURA: Diffuse bilateral interstitial prominence. Stable right upper lobe nodular density. No pulmonary edema. No pleural effusion. No pneumothorax. HEART AND MEDIASTINUM: No acute abnormality of the cardiac and mediastinal silhouettes. BONES AND SOFT TISSUES: No acute osseous abnormality. IMPRESSION: 1. No acute findings. 2. Diffuse bilateral interstitial prominence. 3. Stable right upper lobe nodular density. Electronically signed by: Waddell Calk MD 04/21/2024 01:19 PM EDT RP Workstation: [id number]     Procedures   Medications Ordered in the ED  aspirin  chewable tablet 324 mg (has no administration in time range)    Clinical Course as of 04/22/24 2330  Wed Apr 22, 2024  2327 Discussed with with Dr. Orlando cardiology.  We will admit the patient to the hospital for further evaluation [JK]  2327 CBC metabolic panel normal.  Troponin normal [JK]    Clinical Course User Index [JK] Randol Simmonds, MD                                  Medical Decision Making Problems Addressed: Unstable angina [facility]): acute illness or injury that poses a threat to life or bodily functions  Amount and/or Complexity of Data Reviewed Labs: ordered. Decision-making details documented in ED Course. Radiology: ordered and independent interpretation performed.  Risk OTC drugs. Decision regarding hospitalization.   Patient presented to the ED with chest pain concerning for new unstable angina.  Patient is currently not having any chest pain.  Her ED workup is reassuring.  Her troponins were normal.  I consulted with Dr. Orlando.  We will admit the patient to [facility] in anticipation have cardiac catheterization tomorrow for further evaluation     Final diagnoses:  Unstable angina [facility])    ED Discharge Orders     None          Randol Simmonds, MD 04/22/24 [phone]

## 2024-04-22 NOTE — Telephone Encounter (Signed)
 Notified pt that Dr. Michele suggested she go to the ED rather than be seen by him tomorrow. Pt verbalized understanding. All questions if any were answered.

## 2024-04-23 ENCOUNTER — Ambulatory Visit: Admitting: Cardiology

## 2024-04-23 ENCOUNTER — Ambulatory Visit: Admitting: Physician Assistant

## 2024-04-23 DIAGNOSIS — R079 Chest pain, unspecified: Secondary | ICD-10-CM | POA: Diagnosis present

## 2024-04-23 DIAGNOSIS — Z79899 Other long term (current) drug therapy: Secondary | ICD-10-CM | POA: Diagnosis not present

## 2024-04-23 DIAGNOSIS — Z8249 Family history of ischemic heart disease and other diseases of the circulatory system: Secondary | ICD-10-CM | POA: Diagnosis not present

## 2024-04-23 DIAGNOSIS — J449 Chronic obstructive pulmonary disease, unspecified: Secondary | ICD-10-CM | POA: Diagnosis not present

## 2024-04-23 DIAGNOSIS — E7800 Pure hypercholesterolemia, unspecified: Secondary | ICD-10-CM | POA: Diagnosis not present

## 2024-04-23 DIAGNOSIS — E785 Hyperlipidemia, unspecified: Secondary | ICD-10-CM

## 2024-04-23 DIAGNOSIS — Z7982 Long term (current) use of aspirin: Secondary | ICD-10-CM | POA: Diagnosis not present

## 2024-04-23 DIAGNOSIS — Z7902 Long term (current) use of antithrombotics/antiplatelets: Secondary | ICD-10-CM | POA: Diagnosis not present

## 2024-04-23 DIAGNOSIS — Z87891 Personal history of nicotine dependence: Secondary | ICD-10-CM | POA: Diagnosis not present

## 2024-04-23 DIAGNOSIS — I25110 Atherosclerotic heart disease of native coronary artery with unstable angina pectoris: Secondary | ICD-10-CM | POA: Diagnosis not present

## 2024-04-23 LAB — CBC
HCT: 38.9 % (ref 36.0–46.0)
Hemoglobin: 13.1 g/dL (ref 12.0–15.0)
MCH: 29.2 pg (ref 26.0–34.0)
MCHC: 33.7 g/dL (ref 30.0–36.0)
MCV: 86.8 fL (ref 80.0–100.0)
Platelets: 229 K/uL (ref 150–400)
RBC: 4.48 MIL/uL (ref 3.87–5.11)
RDW: 13 % (ref 11.5–15.5)
WBC: 6.8 K/uL (ref 4.0–10.5)
nRBC: 0 % (ref 0.0–0.2)

## 2024-04-23 LAB — HEMOGLOBIN A1C
Hgb A1c MFr Bld: 4.8 % (ref 4.8–5.6)
Mean Plasma Glucose: 91.06 mg/dL

## 2024-04-23 LAB — COMPREHENSIVE METABOLIC PANEL WITH GFR
ALT: 9 U/L (ref 0–44)
AST: 14 U/L — ABNORMAL LOW (ref 15–41)
Albumin: 4.1 g/dL (ref 3.5–5.0)
Alkaline Phosphatase: 60 U/L (ref 38–126)
Anion gap: 12 (ref 5–15)
BUN: 11 mg/dL (ref 8–23)
CO2: 25 mmol/L (ref 22–32)
Calcium: 9.4 mg/dL (ref 8.9–10.3)
Chloride: 103 mmol/L (ref 98–111)
Creatinine, Ser: 0.73 mg/dL (ref 0.44–1.00)
GFR, Estimated: >60 mL/min (ref >60)
Glucose, Bld: 83 mg/dL (ref 70–99)
Potassium: 3.7 mmol/L (ref 3.5–5.1)
Sodium: 140 mmol/L (ref 135–145)
Total Bilirubin: 1 mg/dL (ref 0.0–1.2)
Total Protein: 7 g/dL (ref 6.5–8.1)

## 2024-04-23 LAB — HIV ANTIBODY (ROUTINE TESTING W REFLEX): HIV Screen 4th Generation wRfx: NONREACTIVE

## 2024-04-23 MED ORDER — ATORVASTATIN CALCIUM 80 MG PO TABS
80.0000 mg | ORAL_TABLET | Freq: Every day | ORAL | Status: DC
  Administered 2024-04-23 – 2024-04-25 (×3): 80 mg via ORAL
  Filled 2024-04-23 (×3): qty 1

## 2024-04-23 MED ORDER — ACETAMINOPHEN 325 MG PO TABS
650.0000 mg | ORAL_TABLET | ORAL | Status: DC | PRN
  Administered 2024-04-25: 650 mg via ORAL
  Filled 2024-04-23: qty 2

## 2024-04-23 MED ORDER — NITROGLYCERIN 0.4 MG SL SUBL: 0.4000 mg | SUBLINGUAL_TABLET | SUBLINGUAL | Status: DC | PRN

## 2024-04-23 MED ORDER — ASPIRIN 81 MG PO TBEC
81.0000 mg | DELAYED_RELEASE_TABLET | Freq: Every day | ORAL | Status: DC
  Administered 2024-04-24 – 2024-04-25 (×2): 81 mg via ORAL
  Filled 2024-04-23 (×2): qty 1

## 2024-04-23 MED ORDER — ONDANSETRON HCL 4 MG/2ML IJ SOLN: 4.0000 mg | Freq: Four times a day (QID) | INTRAMUSCULAR | Status: DC | PRN

## 2024-04-23 MED ORDER — HEPARIN SODIUM (PORCINE) 5000 UNIT/ML IJ SOLN
5000.0000 [IU] | Freq: Three times a day (TID) | INTRAMUSCULAR | Status: DC
  Administered 2024-04-23 – 2024-04-25 (×6): 5000 [IU] via SUBCUTANEOUS
  Filled 2024-04-23 (×6): qty 1

## 2024-04-23 NOTE — H&P (Addendum)
 Cardiology Admission History and Physical   Patient ID: Monica Spence MRN: [id number]; DOB: May 07, 1956   Admission date: 04/22/2024  PCP:  Shona Norleen PEDLAR, MD   [facility] Providers Cardiologist:  None   Chief Complaint:  chest pain   Patient Profile: Monica Spence is a 68 y.o. female with elevated CAC, previous smoker who is being seen 04/23/2024 for the evaluation of chest pain.  History of Present Illness: Monica Spence has no prior cardiac history.   Denies any significant past medical history.  Smoker of 15+ years but quit smoking about 4 to 5 years ago.  Not a known diabetic.  Does not do any drugs or alcohol.  She follows with PCP but unable to view note.  She reportedly has been having chest pain and had a coronary calcification score of 634 and started on atorvastatin  80 mg.  Patient recently seen at [facility] ED for chest pain, negative troponins.  EKG without any acute ST-T wave changes.  She was transferred to [facility] for evaluation of chest pain/presumed unstable angina.  Patient reports that she is very active and almost daily walks her dog around the neighborhood.  However for the past 2 weeks she has been noting increased shortness of breath with activity.  Also has been reporting exertional chest pain with radiation into her shoulders.  Says that it feels like a tourniquet around her arms.  However she says chest pain generally occurs after activity while the shortness of breath occurs during activity.  Chest pain is not reproducible, not associated with food, not tender to palpation.  Has not had accompanying diaphoresis or nausea.  Sometimes pain feels like it is a burning sensation.  Not having chest pain at rest.  Last episode was yesterday and otherwise feeling comfortable in bed.  Troponins negative x 2.  CBC BMP unremarkable normal renal function.  Potassium 4.0.  Chest x-ray negative.  Past Medical History:  Diagnosis Date   COPD (chronic  obstructive pulmonary disease) (HCC)    PONV (postoperative nausea and vomiting)    Past Surgical History:  Procedure Laterality Date   APPENDECTOMY     BIOPSY  05/27/2019   Procedure: BIOPSY;  Surgeon: Golda Claudis PENNER, MD;  Location: [facility];  Service: Endoscopy;;   COLONOSCOPY     ESOPHAGOGASTRODUODENOSCOPY N/A 05/27/2019   Procedure: ESOPHAGOGASTRODUODENOSCOPY (EGD);  Surgeon: Golda Claudis PENNER, MD;  Location: [facility]ITE;  Service: Endoscopy;  Laterality: N/A;  9:30   TUBAL LIGATION       Medications Prior to Admission: Prior to Admission medications   Medication Sig Start Date End Date Taking? Authorizing Provider  atorvastatin  (LIPITOR) 80 MG tablet Take 80 mg by mouth daily. 04/20/24  Yes [provider]  meloxicam (MOBIC) 7.5 MG tablet Take 7.5 mg by mouth 2 (two) times daily as needed. 01/09/24  Yes [provider]  nitroGLYCERIN  (NITROSTAT ) 0.4 MG SL tablet Place 0.4 mg under the tongue every 5 (five) minutes as needed. 04/20/24  Yes [provider]  VENTOLIN HFA 108 (90 Base) MCG/ACT inhaler Take 1-2 puffs by mouth daily. Inhale 1-2 puffs by mouth every 4 to 6 hours as needed for shortness of breath or wheezing 10/17/18  Yes [provider]  alum & mag hydroxide-simeth (MAALOX/MYLANTA) 200-200-20 MG/5ML suspension Take 30 mLs by mouth every 6 (six) hours as needed for indigestion or heartburn. Patient not taking: Reported on 04/23/2024    [provider]  bismuth-metronidazole-tetracycline (PYLERA ) 140-125-125  MG capsule Take 3 capsules by mouth 4 (four) times daily -  before meals and at bedtime. Patient not taking: Reported on 04/23/2024 06/01/19   Golda Claudis PENNER, MD  dicyclomine  (BENTYL ) 10 MG capsule Take 1 capsule (10 mg total) by mouth 3 (three) times daily as needed for spasms (for abdominal pain). Patient not taking: Reported on 04/23/2024 04/07/19   Kennedy-Smith, Colleen M, NP  HYDROcodone -acetaminophen  (NORCO/VICODIN) 5-325  MG tablet Take 1 tablet by mouth every 6 (six) hours as needed. Patient not taking: Reported on 04/23/2024 04/04/19   Zackowski, Scott, MD  ondansetron  (ZOFRAN  ODT) 4 MG disintegrating tablet Take 1 tablet (4 mg total) by mouth every 8 (eight) hours as needed. Patient not taking: Reported on 04/23/2024 04/07/19   Cara Elida HERO, NP  pantoprazole  (PROTONIX ) 40 MG tablet Take 1 tablet (40 mg total) by mouth daily before breakfast. Patient not taking: Reported on 04/23/2024 05/27/19   Golda Claudis PENNER, MD     Allergies:   No Known Allergies  Social History:   Social History   Socioeconomic History   Marital status: Single    Spouse name: Not on file   Number of children: Not on file   Years of education: Not on file   Highest education level: Not on file  Occupational History   Not on file  Tobacco Use   Smoking status: Former   Smokeless tobacco: Never  Vaping Use   Vaping status: Never Used  Substance and Sexual Activity   Alcohol use: Yes    Comment: Occasional    Drug use: Never   Sexual activity: Not on file  Other Topics Concern   Not on file  Social History Narrative   Not on file   Social Drivers of Health   Financial Resource Strain: Not on file  Food Insecurity: Not on file  Transportation Needs: Not on file  Physical Activity: Not on file  Stress: Not on file  Social Connections: Not on file  Intimate Partner Violence: Not on file     Family History:   The patient's family history is not on file.    ROS:  Please see the history of present illness. All other ROS reviewed and negative.     Physical Exam/Data: Vitals:   04/23/24 1245 04/23/24 1300 04/23/24 1323 04/23/24 1440  BP:  (!) 114/102  (!) 137/100  Pulse: (!) 55   (!) 59  Resp:    18  Temp:   98 F (36.7 C) 98.2 F (36.8 C)  TempSrc:   Oral Oral  SpO2: 98%   99%  Weight:      Height:       No intake or output data in the 24 hours ending 04/23/24 1528    04/22/2024    7:48 PM  05/27/2019   12:43 PM 04/07/2019    1:43 PM  Last 3 Weights  Weight (lbs) 113 lb 130 lb 127 lb  Weight (kg) 51.256 kg 58.968 kg 57.607 kg     Body mass index is 20.02 kg/m.  General:  Well nourished, well developed, in no acute distress HEENT: normal Neck: no JVD Vascular: No carotid bruits; Distal pulses 2+ bilaterally   Cardiac:  normal S1, S2; RRR; no murmur  Lungs:  clear to auscultation bilaterally, no wheezing, rhonchi or rales  Abd: soft, nontender, no hepatomegaly  Ext: no edema Musculoskeletal:  No deformities, BUE and BLE strength normal and equal Skin: warm and dry  Neuro:  CNs 2-12  intact, no focal abnormalities noted Psych:  Normal affect   EKG: Sinus rhythm heart rate 80.  No acute ST-T wave changes.  PVCs.  Relevant CV Studies:   Laboratory Data: High Sensitivity Troponin:   Recent Labs  Lab 04/21/24 1124 04/21/24 1410  TROPONINIHS 4 4      Chemistry Recent Labs  Lab 04/21/24 1124 04/21/24 1212 04/22/24 1952  NA 138 139 141  K 3.6 3.7 4.0  CL 102 102 104  CO2 24  --  24  GLUCOSE 107* 107* 97  BUN 13 14 16   CREATININE 0.80 0.80 0.75  CALCIUM  9.4  --  10.1  GFRNONAA >60  --  >60  ANIONGAP 12  --  14    No results for input(s): PROT, ALBUMIN, AST, ALT, ALKPHOS, BILITOT in the last 168 hours. Lipids No results for input(s): CHOL, TRIG, HDL, LABVLDL, LDLCALC, CHOLHDL in the last 168 hours. Hematology Recent Labs  Lab 04/21/24 1124 04/21/24 1212 04/22/24 1952  WBC 8.5  --  9.1  RBC 4.41  --  4.46  HGB 12.9 13.3 13.3  HCT 39.6 39.0 39.1  MCV 89.8  --  87.7  MCH 29.3  --  29.8  MCHC 32.6  --  34.0  RDW 12.9  --  13.2  PLT 247  --  251   Thyroid No results for input(s): TSH, FREET4 in the last 168 hours. BNPNo results for input(s): BNP, PROBNP in the last 168 hours.  DDimer No results for input(s): DDIMER in the last 168 hours.  Radiology/Studies:  DG Chest 2 View Result Date: 04/22/2024 CLINICAL DATA:   Chest pain EXAM: CHEST - 2 VIEW COMPARISON:  04/21/2024, 03/25/2024 FINDINGS: Bronchitic changes without focal airspace opacity, pleural effusion or pneumothorax. Slightly diminished nodular opacity in the right upper lung. Normal cardiac size. No pneumothorax IMPRESSION: No active cardiopulmonary disease. Bronchitic changes. Electronically Signed   By: Luke Bun M.D.   On: 04/22/2024 20:09     Assessment and Plan:  Chest pain Elevated CAC score For the past 2 weeks she has been reporting exertional chest pain with radiation to both her arms.  Has recent CAC score of 634.  EKG with no acute ST-T wave changes and she has had negative troponins x 2.  Has some atypical features such as burning pain and chest pain occurring after activity.  RF (previous smoker, CAC).  Last episode of chest pain yesterday. Given intermediate risk and mixed presentation, CCTA is reasonable before jumping to cath. Will try to arrange for tomorrow.  HR 60s. Decide of BB tomorrow if needed. Obtain echocardiogram, lipid panel, A1c, LP(a). Continue atorvastatin  80 mg, will add aspirin .  Statin was just recently started.  Will check lipid panel in 2 months.  Will get CMP tomorrow.  Risk Assessment/Risk Scores:   Code Status: Full Code  Severity of Illness: The appropriate patient status for this patient is OBSERVATION. Observation status is judged to be reasonable and necessary in order to provide the required intensity of service to ensure the patient's safety. The patient's presenting symptoms, physical exam findings, and initial radiographic and laboratory data in the context of their medical condition is felt to place them at decreased risk for further clinical deterioration. Furthermore, it is anticipated that the patient will be medically stable for discharge from the hospital within 2 midnights of admission.   For questions or updates, please contact [facility] Please consult [email or web address] for contact  info under     Signed,  Thom LITTIE Sluder, PA-C  04/23/2024 3:28 PM

## 2024-04-23 NOTE — ED Notes (Signed)
 Monica Spence w/ cl called for transport

## 2024-04-24 ENCOUNTER — Observation Stay (HOSPITAL_COMMUNITY)

## 2024-04-24 ENCOUNTER — Encounter (HOSPITAL_COMMUNITY)
Admission: EM | Disposition: A | Discharge status: Home / Self Care | Payer: Self-pay | Source: Home / Self Care | Attending: Cardiology

## 2024-04-24 ENCOUNTER — Observation Stay (HOSPITAL_BASED_OUTPATIENT_CLINIC_OR_DEPARTMENT_OTHER)
Admit: 2024-04-24 | Discharge: 2024-04-24 | Disposition: A | Discharge status: Home / Self Care | Attending: Internal Medicine | Admitting: Internal Medicine

## 2024-04-24 ENCOUNTER — Other Ambulatory Visit: Payer: Self-pay | Admitting: Internal Medicine

## 2024-04-24 DIAGNOSIS — E7800 Pure hypercholesterolemia, unspecified: Secondary | ICD-10-CM

## 2024-04-24 DIAGNOSIS — J449 Chronic obstructive pulmonary disease, unspecified: Secondary | ICD-10-CM | POA: Diagnosis not present

## 2024-04-24 DIAGNOSIS — I25110 Atherosclerotic heart disease of native coronary artery with unstable angina pectoris: Secondary | ICD-10-CM | POA: Diagnosis not present

## 2024-04-24 DIAGNOSIS — I2510 Atherosclerotic heart disease of native coronary artery without angina pectoris: Secondary | ICD-10-CM

## 2024-04-24 DIAGNOSIS — R079 Chest pain, unspecified: Secondary | ICD-10-CM

## 2024-04-24 DIAGNOSIS — Z712 Person consulting for explanation of examination or test findings: Secondary | ICD-10-CM

## 2024-04-24 DIAGNOSIS — Z8249 Family history of ischemic heart disease and other diseases of the circulatory system: Secondary | ICD-10-CM | POA: Diagnosis not present

## 2024-04-24 DIAGNOSIS — Z79899 Other long term (current) drug therapy: Secondary | ICD-10-CM | POA: Diagnosis not present

## 2024-04-24 DIAGNOSIS — R931 Abnormal findings on diagnostic imaging of heart and coronary circulation: Secondary | ICD-10-CM

## 2024-04-24 DIAGNOSIS — Z7902 Long term (current) use of antithrombotics/antiplatelets: Secondary | ICD-10-CM | POA: Diagnosis not present

## 2024-04-24 DIAGNOSIS — Z7982 Long term (current) use of aspirin: Secondary | ICD-10-CM | POA: Diagnosis not present

## 2024-04-24 DIAGNOSIS — Z87891 Personal history of nicotine dependence: Secondary | ICD-10-CM | POA: Diagnosis not present

## 2024-04-24 DIAGNOSIS — I200 Unstable angina: Secondary | ICD-10-CM | POA: Diagnosis not present

## 2024-04-24 HISTORY — PX: CORONARY STENT INTERVENTION: CATH118234

## 2024-04-24 HISTORY — PX: LEFT HEART CATH AND CORONARY ANGIOGRAPHY: CATH118249

## 2024-04-24 LAB — LIPID PANEL
Cholesterol: 208 mg/dL — ABNORMAL HIGH (ref 0–200)
HDL: 52 mg/dL (ref >40)
LDL Cholesterol: 132 mg/dL — ABNORMAL HIGH (ref 0–99)
Total CHOL/HDL Ratio: 4 ratio
Triglycerides: 119 mg/dL (ref <150)
VLDL: 24 mg/dL (ref 0–40)

## 2024-04-24 LAB — BASIC METABOLIC PANEL WITH GFR
Anion gap: 11 (ref 5–15)
BUN: 14 mg/dL (ref 8–23)
CO2: 22 mmol/L (ref 22–32)
Calcium: 9.2 mg/dL (ref 8.9–10.3)
Chloride: 107 mmol/L (ref 98–111)
Creatinine, Ser: 0.57 mg/dL (ref 0.44–1.00)
GFR, Estimated: >60 mL/min (ref >60)
Glucose, Bld: 82 mg/dL (ref 70–99)
Potassium: 3.8 mmol/L (ref 3.5–5.1)
Sodium: 140 mmol/L (ref 135–145)

## 2024-04-24 LAB — ECHOCARDIOGRAM COMPLETE
Area-P 1/2: 4.15 cm2
Calc EF: 70.1 %
Height: 63 in
S' Lateral: 2.1 cm
Single Plane A2C EF: 72.8 %
Single Plane A4C EF: 67.1 %
Weight: 1808 [oz_av]

## 2024-04-24 LAB — CBC
HCT: 41.4 % (ref 36.0–46.0)
Hemoglobin: 13.9 g/dL (ref 12.0–15.0)
MCH: 29.1 pg (ref 26.0–34.0)
MCHC: 33.6 g/dL (ref 30.0–36.0)
MCV: 86.8 fL (ref 80.0–100.0)
Platelets: 247 K/uL (ref 150–400)
RBC: 4.77 MIL/uL (ref 3.87–5.11)
RDW: 13 % (ref 11.5–15.5)
WBC: 7 K/uL (ref 4.0–10.5)
nRBC: 0 % (ref 0.0–0.2)

## 2024-04-24 LAB — POCT ACTIVATED CLOTTING TIME
Activated Clotting Time: 498 s
Activated Clotting Time: 331 s

## 2024-04-24 SURGERY — LEFT HEART CATH AND CORONARY ANGIOGRAPHY
Anesthesia: LOCAL

## 2024-04-24 MED ORDER — NITROGLYCERIN 0.4 MG SL SUBL
SUBLINGUAL_TABLET | SUBLINGUAL | Status: AC
  Filled 2024-04-24: qty 2

## 2024-04-24 MED ORDER — LABETALOL HCL 5 MG/ML IV SOLN: 10.0000 mg | INTRAVENOUS | Status: AC | PRN

## 2024-04-24 MED ORDER — TICAGRELOR 90 MG PO TABS
ORAL_TABLET | ORAL | Status: DC | PRN
  Administered 2024-04-24: 180 mg via ORAL

## 2024-04-24 MED ORDER — IOHEXOL 350 MG/ML SOLN
100.0000 mL | Freq: Once | INTRAVENOUS | Status: AC | PRN
  Administered 2024-04-24: 175 mL via INTRAVENOUS

## 2024-04-24 MED ORDER — HYDRALAZINE HCL 20 MG/ML IJ SOLN: 10.0000 mg | INTRAMUSCULAR | Status: AC | PRN

## 2024-04-24 MED ORDER — SODIUM CHLORIDE 0.9% FLUSH: 3.0000 mL | INTRAVENOUS | Status: DC | PRN

## 2024-04-24 MED ORDER — LIDOCAINE HCL (PF) 1 % IJ SOLN
INTRAMUSCULAR | Status: AC
  Filled 2024-04-24: qty 30

## 2024-04-24 MED ORDER — FENTANYL CITRATE (PF) 100 MCG/2ML IJ SOLN
INTRAMUSCULAR | Status: AC
  Filled 2024-04-24: qty 2

## 2024-04-24 MED ORDER — IOHEXOL 350 MG/ML SOLN
INTRAVENOUS | Status: DC | PRN
  Administered 2024-04-24: 60 mL

## 2024-04-24 MED ORDER — FREE WATER
500.0000 mL | Freq: Once | Status: AC
  Administered 2024-04-24: 500 mL via ORAL

## 2024-04-24 MED ORDER — MIDAZOLAM HCL 2 MG/2ML IJ SOLN
INTRAMUSCULAR | Status: DC | PRN
  Administered 2024-04-24: 1 mg via INTRAVENOUS

## 2024-04-24 MED ORDER — HEPARIN (PORCINE) IN NACL 1000-0.9 UT/500ML-% IV SOLN
INTRAVENOUS | Status: DC | PRN
  Administered 2024-04-24 (×2): 500 mL

## 2024-04-24 MED ORDER — NITROGLYCERIN 0.4 MG SL SUBL
0.8000 mg | SUBLINGUAL_TABLET | Freq: Once | SUBLINGUAL | Status: AC
Start: 2024-04-24 — End: 2024-04-24
  Administered 2024-04-24: 0.8 mg via SUBLINGUAL

## 2024-04-24 MED ORDER — MIDAZOLAM HCL 2 MG/2ML IJ SOLN
INTRAMUSCULAR | Status: AC
  Filled 2024-04-24: qty 2

## 2024-04-24 MED ORDER — VERAPAMIL HCL 2.5 MG/ML IV SOLN
INTRAVENOUS | Status: DC | PRN
  Administered 2024-04-24: 10 mL via INTRA_ARTERIAL

## 2024-04-24 MED ORDER — SODIUM CHLORIDE 0.9% FLUSH
3.0000 mL | Freq: Two times a day (BID) | INTRAVENOUS | Status: DC
Start: 2024-04-24 — End: 2024-04-25
  Administered 2024-04-24 – 2024-04-25 (×2): 3 mL via INTRAVENOUS

## 2024-04-24 MED ORDER — HEPARIN SODIUM (PORCINE) 1000 UNIT/ML IJ SOLN
INTRAMUSCULAR | Status: DC | PRN
  Administered 2024-04-24: 2000 [IU] via INTRA_ARTERIAL
  Administered 2024-04-24: 5000 [IU] via INTRA_ARTERIAL

## 2024-04-24 MED ORDER — SODIUM CHLORIDE 0.9 % WEIGHT BASED INFUSION
3.0000 mL/kg/h | INTRAVENOUS | Status: DC
  Administered 2024-04-24: 3 mL/kg/h via INTRAVENOUS

## 2024-04-24 MED ORDER — TICAGRELOR 90 MG PO TABS
90.0000 mg | ORAL_TABLET | Freq: Two times a day (BID) | ORAL | Status: DC
  Administered 2024-04-24 – 2024-04-25 (×2): 90 mg via ORAL
  Filled 2024-04-24 (×2): qty 1

## 2024-04-24 MED ORDER — FENTANYL CITRATE (PF) 100 MCG/2ML IJ SOLN
INTRAMUSCULAR | Status: DC | PRN
  Administered 2024-04-24: 25 ug via INTRAVENOUS

## 2024-04-24 MED ORDER — ASPIRIN 81 MG PO CHEW: 81.0000 mg | CHEWABLE_TABLET | Freq: Every day | ORAL | Status: DC

## 2024-04-24 MED ORDER — SODIUM CHLORIDE 0.9 % IV SOLN: 250.0000 mL | INTRAVENOUS | Status: DC | PRN

## 2024-04-24 MED ORDER — VERAPAMIL HCL 2.5 MG/ML IV SOLN
INTRAVENOUS | Status: AC
  Filled 2024-04-24: qty 2

## 2024-04-24 MED ORDER — SODIUM CHLORIDE 0.9 % WEIGHT BASED INFUSION
1.0000 mL/kg/h | INTRAVENOUS | Status: DC
  Administered 2024-04-24: 1 mL/kg/h via INTRAVENOUS

## 2024-04-24 MED ORDER — HEPARIN SODIUM (PORCINE) 1000 UNIT/ML IJ SOLN
INTRAMUSCULAR | Status: AC
  Filled 2024-04-24: qty 10

## 2024-04-24 MED ORDER — LIDOCAINE HCL (PF) 1 % IJ SOLN
INTRAMUSCULAR | Status: DC | PRN
  Administered 2024-04-24: 2 mL via INTRADERMAL

## 2024-04-24 SURGICAL SUPPLY — 15 items
BALLOON EMERGE MR 2.5X12 (BALLOONS) IMPLANT
BALLOON EMERGE MR 3.0X15 (BALLOONS) IMPLANT
BALLOON ~~LOC~~ EMERGE MR 3.25X15 (BALLOONS) IMPLANT
CATH INFINITI 5FR ANG PIGTAIL (CATHETERS) IMPLANT
CATH INFINITI AMBI 6FR TG (CATHETERS) IMPLANT
CATH VISTA GUIDE 6FR JR4 ECOPK (CATHETERS) IMPLANT
DEVICE RAD TR BAND REGULAR (VASCULAR PRODUCTS) IMPLANT
GLIDESHEATH SLEND SS 6F .021 (SHEATH) IMPLANT
GUIDEWIRE VAS SION BLUE 190 (WIRE) IMPLANT
KIT ENCORE 26 ADVANTAGE (KITS) IMPLANT
KIT HEMO VALVE WATCHDOG (MISCELLANEOUS) IMPLANT
PACK CARDIAC CATHETERIZATION (CUSTOM PROCEDURE TRAY) ×1 IMPLANT
SET ATX-X65L (MISCELLANEOUS) IMPLANT
STENT SYNERGY XD 3.0X32 (Permanent Stent) IMPLANT
WIRE EMERALD 3MM-J .035X260CM (WIRE) IMPLANT

## 2024-04-24 NOTE — Interval H&P Note (Signed)
 History and Physical Interval Note:  04/24/2024 4:00 PM  Monica Spence  has presented today for surgery, with the diagnosis of abnormal CT.  The various methods of treatment have been discussed with the patient and family. After consideration of risks, benefits and other options for treatment, the patient has consented to  Procedure(s): LEFT HEART CATH AND CORONARY ANGIOGRAPHY (N/A) as a surgical intervention.  The patient's history has been reviewed, patient examined, no change in status, stable for surgery.  I have reviewed the patient's chart and labs.  Questions were answered to the patient's satisfaction.     Laqueena Hinchey K Marquelle Balow

## 2024-04-24 NOTE — TOC CM/SW Note (Signed)
 Transition of Care [facility]) - Inpatient Brief Assessment   Patient Details  Name: Monica Spence MRN: [id number] Date of Birth: 01-Feb-1956  Transition of Care [facility]) CM/SW Contact:    Sudie Erminio Deems, RN Phone Number: 04/24/2024, 11:35 AM   Clinical Narrative: Patient presented for unstable angina. PTA patient was independent from home with support of significant other. Patient does not use any DME. Patient has PCP and can get to appointments without any issues. No home needs identified at this time by Inpatient Case Manager.   Transition of Care Asessment: Insurance and Status: Insurance coverage has been reviewed Patient has primary care physician: Yes Home environment has been reviewed: reviewed Prior level of function:: independent Prior/Current Home Services: No current home services Social Drivers of Health Review: SDOH reviewed no interventions necessary Readmission risk has been reviewed: Yes Transition of care needs: no transition of care needs at this time

## 2024-04-24 NOTE — Progress Notes (Signed)
  Progress Note  Patient Name: Monica Spence Date of Encounter: 04/24/2024 [facility] Cardiologist: None   Interval Summary   Denies any chest pain or shortness of breath since being hospitalized.  Reported that she does feel slightly nauseated but denies any vomiting.  Vital Signs Vitals:   04/23/24 1910 04/23/24 2320 04/24/24 0448 04/24/24 0937  BP: (!) 149/77 125/78 114/60 133/74  Pulse: 73 (!) 58 60 (!) 56  Resp: 16 18 19 20   Temp: 98.5 F (36.9 C) 98.4 F (36.9 C) 97.9 F (36.6 C) 97.9 F (36.6 C)  TempSrc: Oral Oral Oral Oral  SpO2: 99% 94%  98%  Weight:      Height:       No intake or output data in the 24 hours ending 04/24/24 0954    04/22/2024    7:48 PM 05/27/2019   12:43 PM 04/07/2019    1:43 PM  Last 3 Weights  Weight (lbs) 113 lb 130 lb 127 lb  Weight (kg) 51.256 kg 58.968 kg 57.607 kg      Telemetry/ECG  Normal sinus rhythm with heart rates in the 60s 70s.  Has had multiple short runs of ventricular trigeminy - Personally Reviewed  Physical Exam  GEN: No acute distress.  Alert and oriented on room air Neck: No JVD Cardiac: RRR, no murmurs, rubs, or gallops.  Respiratory: Clear to auscultation bilaterally. GI: Soft, nontender, non-distended  MS: No edema  Assessment & Plan  Lyrik Dockstader is a 68 y.o. female with elevated CAC, and previous smoker who is being seen for chest pain   Chest pain Elevated coronary calcium  score Hyperlipidemia Prior tobacco use Has several risk factors for CAD including a family history of heart disease and prior tobacco use. Presented to the emergency department for exertional chest pain that radiated to her arms High-sensitivity troponins negative x 2 No acute ischemic changes on EKG - Lipid panel showed an LDL of 132. - LPA pending Patient was scheduled for a coronary CTA today. Echo pending Continue aspirin  81 mg daily Continue atorvastatin  80 mg daily   DVT prophylaxis Continue heparin  for  DVT prophylaxis    Medical Readiness Date: 04/24/2024    For questions or updates, please contact [facility] Please consult [email or web address] for contact info under       Signed, Briar Witherspoon, PA-C

## 2024-04-24 NOTE — Plan of Care (Signed)

## 2024-04-24 NOTE — H&P (View-Only) (Signed)
  Progress Note  Patient Name: Monica Spence Date of Encounter: 04/24/2024 [facility] Cardiologist: None   Interval Summary   Denies any chest pain or shortness of breath since being hospitalized.  Reported that she does feel slightly nauseated but denies any vomiting.  Vital Signs Vitals:   04/23/24 1910 04/23/24 2320 04/24/24 0448 04/24/24 0937  BP: (!) 149/77 125/78 114/60 133/74  Pulse: 73 (!) 58 60 (!) 56  Resp: 16 18 19 20   Temp: 98.5 F (36.9 C) 98.4 F (36.9 C) 97.9 F (36.6 C) 97.9 F (36.6 C)  TempSrc: Oral Oral Oral Oral  SpO2: 99% 94%  98%  Weight:      Height:       No intake or output data in the 24 hours ending 04/24/24 0954    04/22/2024    7:48 PM 05/27/2019   12:43 PM 04/07/2019    1:43 PM  Last 3 Weights  Weight (lbs) 113 lb 130 lb 127 lb  Weight (kg) 51.256 kg 58.968 kg 57.607 kg      Telemetry/ECG  Normal sinus rhythm with heart rates in the 60s 70s.  Has had multiple short runs of ventricular trigeminy - Personally Reviewed  Physical Exam  GEN: No acute distress.  Alert and oriented on room air Neck: No JVD Cardiac: RRR, no murmurs, rubs, or gallops.  Respiratory: Clear to auscultation bilaterally. GI: Soft, nontender, non-distended  MS: No edema  Assessment & Plan  Monica Spence is a 68 y.o. female with elevated CAC, and previous smoker who is being seen for chest pain   Chest pain Elevated coronary calcium  score Hyperlipidemia Prior tobacco use Has several risk factors for CAD including a family history of heart disease and prior tobacco use. Presented to the emergency department for exertional chest pain that radiated to her arms High-sensitivity troponins negative x 2 No acute ischemic changes on EKG - Lipid panel showed an LDL of 132. - LPA pending Patient was scheduled for a coronary CTA today. Echo pending Continue aspirin  81 mg daily Continue atorvastatin  80 mg daily   DVT prophylaxis Continue heparin  for  DVT prophylaxis    Medical Readiness Date: 04/24/2024    For questions or updates, please contact [facility] Please consult [email or web address] for contact info under       Signed, Briar Witherspoon, PA-C

## 2024-04-24 NOTE — Care Management Obs Status (Signed)
 MEDICARE OBSERVATION STATUS NOTIFICATION   Patient Details  Name: Monica Spence MRN: [id number] Date of Birth: [birth year]   Medicare Observation Status Notification Given:  Yes    Vonzell Arrie Sharps 04/24/2024, 8:43 AM

## 2024-04-25 ENCOUNTER — Other Ambulatory Visit: Payer: Self-pay

## 2024-04-25 ENCOUNTER — Other Ambulatory Visit (HOSPITAL_COMMUNITY): Payer: Self-pay

## 2024-04-25 ENCOUNTER — Encounter (HOSPITAL_COMMUNITY): Payer: Self-pay | Admitting: Internal Medicine

## 2024-04-25 DIAGNOSIS — I200 Unstable angina: Secondary | ICD-10-CM | POA: Diagnosis not present

## 2024-04-25 LAB — BASIC METABOLIC PANEL WITH GFR
Anion gap: 12 (ref 5–15)
BUN: 13 mg/dL (ref 8–23)
CO2: 22 mmol/L (ref 22–32)
Calcium: 9.1 mg/dL (ref 8.9–10.3)
Chloride: 103 mmol/L (ref 98–111)
Creatinine, Ser: 0.63 mg/dL (ref 0.44–1.00)
GFR, Estimated: >60 mL/min (ref >60)
Glucose, Bld: 88 mg/dL (ref 70–99)
Potassium: 3.7 mmol/L (ref 3.5–5.1)
Sodium: 137 mmol/L (ref 135–145)

## 2024-04-25 LAB — CBC
HCT: 39.2 % (ref 36.0–46.0)
Hemoglobin: 13.7 g/dL (ref 12.0–15.0)
MCH: 29.8 pg (ref 26.0–34.0)
MCHC: 34.9 g/dL (ref 30.0–36.0)
MCV: 85.2 fL (ref 80.0–100.0)
Platelets: 219 K/uL (ref 150–400)
RBC: 4.6 MIL/uL (ref 3.87–5.11)
RDW: 12.7 % (ref 11.5–15.5)
WBC: 8 K/uL (ref 4.0–10.5)
nRBC: 0 % (ref 0.0–0.2)

## 2024-04-25 LAB — LIPOPROTEIN A (LPA): Lipoprotein (a): 10.5 nmol/L (ref <75.0)

## 2024-04-25 MED ORDER — TICAGRELOR 90 MG PO TABS
90.0000 mg | ORAL_TABLET | Freq: Two times a day (BID) | ORAL | 11 refills | Status: AC
  Filled 2024-04-25: qty 60, 30d supply, fill #0

## 2024-04-25 MED ORDER — OXYCODONE HCL 5 MG PO TABS
5.0000 mg | ORAL_TABLET | Freq: Four times a day (QID) | ORAL | Status: DC | PRN
  Administered 2024-04-25: 5 mg via ORAL
  Filled 2024-04-25: qty 1

## 2024-04-25 MED ORDER — ASPIRIN 81 MG PO TBEC
81.0000 mg | DELAYED_RELEASE_TABLET | Freq: Every day | ORAL | 3 refills | Status: AC
  Filled 2024-04-25: qty 90, 90d supply, fill #0

## 2024-04-25 NOTE — Plan of Care (Signed)
  Problem: Health Behavior/Discharge Planning: Goal: Ability to manage health-related needs will improve Outcome: Progressing   Problem: Clinical Measurements: Goal: Respiratory complications will improve Outcome: Progressing Goal: Cardiovascular complication will be avoided Outcome: Progressing   Problem: Activity: Goal: Risk for activity intolerance will decrease Outcome: Progressing   Problem: Pain Managment: Goal: General experience of comfort will improve and/or be controlled Outcome: Progressing   Problem: Safety: Goal: Ability to remain free from injury will improve Outcome: Progressing   Problem: Activity: Goal: Ability to tolerate increased activity will improve Outcome: Progressing   Problem: Cardiac: Goal: Ability to achieve and maintain adequate cardiovascular perfusion will improve Outcome: Progressing

## 2024-04-25 NOTE — Discharge Summary (Addendum)
 Discharge Summary   Patient ID: Monica Spence MRN: [id number]; DOB: 1955/10/30  Admit date: 04/22/2024 Discharge date: 04/25/2024  PCP:  Shona Norleen PEDLAR, MD    HeartCare Providers Cardiologist:  None       Discharge Diagnoses  Principal Problem:   Unstable angina [facility]) Active Problems:   Coronary artery disease involving native coronary artery of native heart with unstable angina pectoris [facility])   Hypercholesterolemia   Diagnostic Studies/Procedures   Echocardiogram 04/24/2024: Impressions: 1. Left ventricular ejection fraction, by estimation, is 65 to 70%. The  left ventricle has normal function. The left ventricle has no regional  wall motion abnormalities. Left ventricular diastolic parameters are  consistent with Grade I diastolic  dysfunction (impaired relaxation).   2. Right ventricular systolic function is normal. The right ventricular  size is normal. Tricuspid regurgitation signal is inadequate for assessing  PA pressure.   3. The mitral valve is normal in structure. Trivial mitral valve  regurgitation. No evidence of mitral stenosis.   4. The aortic valve was not well visualized. Aortic valve regurgitation  is not visualized. No aortic stenosis is present.   5. The inferior vena cava is normal in size with greater than 50%  respiratory variability, suggesting right atrial pressure of 3 mmHg.   6. Cannot exclude a small PFO.  _______________  Coronary CTA 04/24/2024: Impressions: 1. Coronary calcium  score of 555. This was 94th percentile for age, sex, and race matched control. 2. Normal coronary origin with right dominance. 3. CAD-RADS 4a Severe stenosis with low attenuation and vulnerable plaque (V1) . (70-99%). Cardiac catheterization or CT FFR is recommended. Consider symptom-guided anti-ischemic pharmacotherapy as well as risk factor modification per guideline directed care.  FFR Summary: CT FFR analysis shows evidence of significant functional  stenosis in the mid-distal RCA. _____________  Left Cardiac Catheterization 04/24/2024:   Mid RCA to Dist RCA lesion is 90% stenosed.   Prox LAD to Mid LAD lesion is 40% stenosed.   A stent was successfully placed.   Post intervention, there is a 0% residual stenosis.   1.  High-grade mid right coronary lesion treated with a 3.0 x 32 mm Synergy XD stent postdilated to 3.25 mm. 2.  Mild LAD disease. 3.  LVEDP of 9 mmHg   Recommendation: Dual antiplatelet therapy with aspirin  and Brilinta  for 1 year then Brilinta  monotherapy indefinitely.  Diagnostic Dominance: Right  Intervention        History of Present Illness   Monica Spence is a 68 y.o. female with a history of coronary artery calcifications with a coronary calcium  score of 634 (95th percentile for age and sex) on 04/17/2024 and prior tobacco use who was admitted on 04/22/2024 for further evaluation of chest pain.  Patient recently had a coronary calcium  score on 04/17/2024 which was elevated at 634 (95th percentile for age and sex). She was then seen in the ED on 04/21/2024 for chest pain and shortness of breath. EKG showed no acute ischemic changes and high-sensitivity troponin negative x2. However, patient left the ED before formally being evaluated.   She presented back to the ED on 04/22/2024 for evaluation of chest pain. She reported exertional chest pain with radiation to her shoulders as well as exertional shortness of breath for the last 2 weeks. EKG showed no acute ischemic changes and high-sensitivity troponin negative x2. She was admitted for further evaluation.   Hospital Course   Consultants: None.   Chest Pain CAD Patient presented for further evaluation of  exertional chest pain and dyspnea as stated above. High-sensitivity troponin negative x2. Echo showed LVEF of 65-70% with no regional wall motion abnormalities and grade 1 diastolic dysfunction. Coronary CTA showed 555 (94th percentile age and sex) and severe  stenosis with low attenuation and vulnerable plaque of mid RCA (FFR positive). This led to a LHC which confirmed 90% stenosis of mid to distal RCA and also showed 40% stenosis of proximal to mid LAD. She underwent successful PCI with DES to mid RCA lesion. She had some mild radial sight bleeding and pain following PCI, but radial cath site looks stable on day of discharge - she has some bruising but no hematoma and good radial pulse. Instructions/ precautions regarding cath site care were given prior to discharge. She was started on DAPT with Aspirin  81mg  daily and Brilinta  90mg  twice daily with plans to continue this for 1 year and then switch to Brilinta  monotherapy. She was also started on Lipitor 80mg  daily.  Of note, she takes Meloxicam as needed for back pain. Recommended avoiding NSAIDS and using OTC Tylenol  as needed for pain instead.  Hyperlipidemia Lipid panel this admission: Total Cholesterol 208, Triglycerides 119, HDL 52, LDL 132. LDL goal <70 given CAD. He had recently been prescribed Lipitor 80mg  daily as an outpatient but had not started this yet. She was started on this during admission. Will need repeat lipid panel and LFTs in 6-8 weeks.  Patient seen and examined by Dr. Loni today and felt to be stable for discharge. Outpatient follow-up arranged. Medications as below.    Did the patient have an acute coronary syndrome (MI, NSTEMI, STEMI, etc) this admission?:  No                               Did the patient have a percutaneous coronary intervention (stent / angioplasty)?:  Yes.     Cath/PCI Registry Performance & Quality Measures: Aspirin  prescribed? - Yes ADP Receptor Inhibitor (Plavix/Clopidogrel, Brilinta /Ticagrelor  or Effient/Prasugrel) prescribed (includes medically managed patients)? - Yes High Intensity Statin (Lipitor 40-80mg  or Crestor 20-40mg ) prescribed? - Yes For EF <40%, was ACEI/ARB prescribed? - Not Applicable (EF >/= 40%) For EF <40%, Aldosterone Antagonist  (Spironolactone or Eplerenone) prescribed? - Not Applicable (EF >/= 40%) Cardiac Rehab Phase II ordered? - Yes   Discharge Vitals Blood pressure (!) 133/93, pulse 75, temperature 98 F (36.7 C), temperature source Oral, resp. rate 18, height 5' 3 (1.6 m), weight 51.3 kg, SpO2 93%.  Filed Weights   04/22/24 1948  Weight: 51.3 kg    Labs & Radiologic Studies  CBC Recent Labs    04/24/24 0427 04/25/24 0615  WBC 7.0 8.0  HGB 13.9 13.7  HCT 41.4 39.2  MCV 86.8 85.2  PLT 247 219   Basic Metabolic Panel Recent Labs    90/87/74 0427 04/25/24 0615  NA 140 137  K 3.8 3.7  CL 107 103  CO2 22 22  GLUCOSE 82 88  BUN 14 13  CREATININE 0.57 0.63  CALCIUM  9.2 9.1   Liver Function Tests Recent Labs    04/23/24 1549  AST 14*  ALT 9  ALKPHOS 60  BILITOT 1.0  PROT 7.0  ALBUMIN 4.1   No results for input(s): LIPASE, AMYLASE in the last 72 hours. High Sensitivity Troponin:   Recent Labs  Lab 04/21/24 1124 04/21/24 1410  TROPONINIHS 4 4    Recent Labs  Lab 04/22/24 1952 04/22/24 2309  TRNPT <  15 <15    BNP Invalid input(s): POCBNP No results for input(s): PROBNP in the last 72 hours.  No results for input(s): BNP in the last 72 hours.  D-Dimer No results for input(s): DDIMER in the last 72 hours. Hemoglobin A1C Recent Labs    04/23/24 1549  HGBA1C 4.8   Fasting Lipid Panel Recent Labs    04/24/24 0427  CHOL 208*  HDL 52  LDLCALC 132*  TRIG 119  CHOLHDL 4.0   Lipoprotein (a)  Date/Time Value Ref Range Status  04/24/2024 04:27 AM 10.5 <75.0 nmol/L Final    Comment:    (NOTE) This test was developed and its performance characteristics determined by [facility]. It has not been cleared or approved by the Food and Drug Administration. Note:  Values greater than or equal to 75.0 nmol/L may       indicate an independent risk factor for CHD,       but must be evaluated with caution when applied       to non-Caucasian populations due to the        influence of genetic factors on Lp(a) across       ethnicities. Performed At: [facility] [address] [place], [place] [id number] Jennette Shorter MD Ey:[id number]     Thyroid Function Tests No results for input(s): TSH, T4TOTAL, T3FREE, THYROIDAB in the last 72 hours.  Invalid input(s): FREET3 _____________  CARDIAC CATHETERIZATION Result Date: 04/24/2024   Mid RCA to Dist RCA lesion is 90% stenosed.   Prox LAD to Mid LAD lesion is 40% stenosed.   A stent was successfully placed.   Post intervention, there is a 0% residual stenosis. 1.  High-grade mid right coronary lesion treated with a 3.0 x 32 mm Synergy XD stent postdilated to 3.25 mm. 2.  Mild LAD disease. 3.  LVEDP of 9 mmHg Recommendation: Dual antiplatelet therapy with aspirin  and Brilinta  for 1 year then Brilinta  monotherapy indefinitely.   CT CORONARY MORPH W/CTA COR W/SCORE W/CA W/CM &/OR WO/CM Addendum Date: 04/24/2024 ADDENDUM REPORT: 04/24/2024 12:04 EXAM: OVER-READ INTERPRETATION CT CHEST The following report is an over-read performed by radiologist Dr. Rogelia Myers of [facility] on 04/24/2024. This over-read does not include interpretation of cardiac or coronary anatomy or pathology. The coronary CTA interpretation by the cardiologist is attached. COMPARISON:  04/17/2024, 12/10/2017 FINDINGS: Pulmonary Embolism: While the exam was not optimized for the evaluation of the pulmonary arteries, no central pulmonary embolism visualized. Cardiovascular: Normal appearance of extracardiac vascular structures. No pericardial effusion. No aortic aneurysm. Mediastinum/Nodes: No mediastinal mass. No mediastinal or hilar lymphadenopathy. Normal esophagus. Lungs/Pleura: The midline trachea and bronchi are patent. Redemonstrated reticulation and scarring in the right middle lobe and lingula. Superimposed tree-in-bud nodularity in the lingula and right middle lobe is similar to the most recent comparison, but felt  to be more conspicuous than on the chest CT from since 2019. No lobar consolidation, pleural effusion, or pneumothorax. Musculoskeletal: No acute fracture or destructive bone lesion. Multilevel thoracic osteophytosis. Upper Abdomen: No acute abnormality in the partially visualized upper abdomen. IMPRESSION: Redemonstrated reticulation and scarring in the right middle lobe and lingula with superimposed tree-in-bud nodularity. While the tree-in-bud nodularity is similar to the most recent comparison, it is felt to be more conspicuous than on the chest CT performed in 2019. If there is a history of chronic cough, this may reflect changes of an atypical infection, such as mycobacterium avium complex. Laboratory correlation recommended. Electronically Signed   By: Rogelia  Carlean M.D.   On: 04/24/2024 12:04   Result Date: 04/24/2024 CLINICAL DATA:  68 Year-old Female EXAM: Cardiac/Coronary  CTA TECHNIQUE: Axial non-contrast 3 mm slices were carried out through the heart. The data set was analyzed on a dedicated work station and scored using the Agatson method. Gantry rotation speed was 250 msecs and collimation was .6 mm. 0.8 mg of sl NTG was given. The 3D data set was reconstructed in 5% intervals of the 35-75% of the R-R cycle. The patient received 100 cc of contrast. FINDINGS: Coronary Arteries:  Normal coronary origin.  Right dominance. Coronary Calcium  Score: Left main: 0 Left anterior descending artery: 292 Left circumflex artery: 118 Right coronary artery: 145 Total: 555 Percentile: 94th for age, sex, and race matched control. Left main: The left main is a large caliber vessel with a normal take off from the left coronary cusp that bifurcates to form a left anterior descending artery and a left circumflex artery. There is no significant plaque. Left anterior descending artery: The LAD is a large caliber vessel with one large diagonal vessel with multiple branches. Mild non-obstructive mixed plaques (25-49%) in  proximal LAD. Moderate calcified stenosis (50-70%) in the mid LAD prior to a mild soft plaque stenosis (vulnerable plaque). Mild non-obstructive mixed plaques (25-49%) throughout the D1. Left circumflex artery: The LCX is non-dominant with one obtuse marginal. Minimal non-obstructive calcified plaques (1-24%) in the proximal LCX, mid LCX, and proximal OM1. Right coronary artery: The RCA is dominant with normal take off from the right coronary cusp. the RCA terminates as a PDA and right posterolateral branch. Mild mixed plaque in the mid RCA superior to a severe soft plaque stenosis (70-99%). There is low attenuation plaque (vulnerable plaque). Other non-coronary findings: Right Atrium: Right atrial size is within normal limits. Right Ventricle: The right ventricular cavity is within normal limits. Left Atrium: Left atrial size is normal in size with no left atrial appendage filling defect. Left Ventricle: The ventricular cavity size is within normal limits. Interatrial septum: Small PFO. Main Pulmonary Artery: Normal size of the pulmonary artery. Pulmonary veins: Normal pulmonary venous drainage. Systemic veins: Normal venous anatomy. Pericardium: Normal thickness without significant effusion or calcium  present. Cardiac valves: The aortic valve is tri-leaflet without significant calcification. The mitral valve is normal without significant calcification and mild thickening. Aorta: Normal caliber without significant calcifications. Extra-cardiac findings: See attached radiology report for non-cardiac structures. Artifact: Slab Image quality: Fair IMPRESSION: 1. Coronary calcium  score of 555. This was 94th percentile for age, sex, and race matched control. 2. Normal coronary origin with right dominance. 3. CAD-RADS 4a Severe stenosis with low attenuation and vulnerable plaque (V1) . (70-99%). Cardiac catheterization or CT FFR is recommended. Consider symptom-guided anti-ischemic pharmacotherapy as well as risk factor  modification per guideline directed care. RECOMMENDATIONS: RECOMMENDATIONS The proposed cut-off value of 1,651 AU yielded a 93 % sensitivity and 75 % specificity in grading AS severity in patients with classical low-flow, low-gradient AS. Proposed different cut-off values to define severe AS for men and women as 2,065 AU and 1,274 AU, respectively. The joint European and American recommendations for the assessment of AS consider the aortic valve calcium  score as a continuum - a very high calcium  score suggests severe AS and a low calcium  score suggests severe AS is unlikely. Donney VEAR Jarome LULLA Stephen RENETTE, et al. 2017 ESC/EACTS Guidelines for the management of valvular heart disease. Eur Heart J 2017;38:2739-91. Coronary artery calcium  (CAC) score is a strong predictor of incident coronary  heart disease (CHD) and provides predictive information beyond traditional risk factors. CAC scoring is reasonable to use in the decision to withhold, postpone, or initiate statin therapy in intermediate-risk or selected borderline-risk asymptomatic adults (age 36-75 years and LDL-C >=70 to <190 mg/dL) who do not have diabetes or established atherosclerotic cardiovascular disease (ASCVD).* In intermediate-risk (10-year ASCVD risk >=7.5% to <20%) adults or selected borderline-risk (10-year ASCVD risk >=5% to <7.5%) adults in whom a CAC score is measured for the purpose of making a treatment decision the following recommendations have been made: If CAC = 0, it is reasonable to withhold statin therapy and reassess in 5 to 10 years, as long as higher risk conditions are absent (diabetes mellitus, family history of premature CHD in first degree relatives (males <55 years; females <65 years), cigarette smoking, LDL >=190 mg/dL or other independent risk factors). If CAC is 1 to 99, it is reasonable to initiate statin therapy for patients >=7 years of age. If CAC is >=100 or >=75th percentile, it is reasonable to initiate statin therapy  at any age. Cardiology referral should be considered for patients with CAC scores =400 or >=75th percentile. *2018 AHA/ACC/AACVPR/AAPA/ABC/ACPM/ADA/AGS/APhA/ASPC/NLA/PCNA Guideline on the Management of Blood Cholesterol: A Report of the American College of Cardiology/American Heart Association Task Force on Clinical Practice Guidelines. J Am Coll Cardiol. 2019;73(24):3168-3209. Electronically Signed: By: Stanly Leavens M.D. On: 04/24/2024 11:47   CT CORONARY FRACTIONAL FLOW RESERVE FLUID ANALYSIS Result Date: 04/24/2024 EXAM: CT-FFR ANALYSIS METHODS: CT data images prepared and securely transmitted to [facility]. FFRCT analysis was performed on the original cardiac CT angiogram dataset. Diagrammatic representation of the FFRCT analysis is returned and provided in a separate PDF document in PACS. This dictation was created using the PDF document and an interactive 3D model of the results. 3D model is not available in the EMR/PACS. Normal FFR range is >0.80. CLINICAL DATA:  Possibly obstructive coronary lesion(s): Mid RCA, Proximal LAD, Mid LAD FINDINGS: 1. Left Main: The terminal FFRct value is 0.99, although this value is not associated with a lesion. 2. LAD: Minimum lesion-specific FFRct of 0.90 (mid LAD. The proximal LAD FFRct value is 0.96, The diagonal vessel is not modeled. 3. LCX: Minimum lesion-specific FFRct of 0.98 (mid LCX). The proximal LCX FFRct value is 0.99, OM system is not modeled. 4. RCA: Minimum lesion-specific FFRct of 0.73 (mid-distal RCA). IMPRESSION: 1. CT FFR analysis shows evidence of significant functional stenosis in the mid-distal RCA. Stanly Leavens MD Electronically Signed   By: Stanly Leavens M.D.   On: 04/24/2024 11:51   ECHOCARDIOGRAM COMPLETE Result Date: 04/24/2024    ECHOCARDIOGRAM REPORT   Patient Name:   Monica Spence Date of Exam: 04/24/2024 Medical Rec #:  [id number]        Height:       63.0 in Accession #:    [id number]       Weight:       113.0 lb  Date of Birth:  Aug 26, 1955        BSA:          1.517 m Patient Age:    68 years         BP:           145/118 mmHg Patient Gender: F                HR:           59 bpm. Exam Location:  Inpatient Procedure: 2D Echo (Both Spectral and Color  Flow Doppler were utilized during            procedure). Indications:    Chest pain  History:        Patient has no prior history of Echocardiogram examinations.                 Signs/Symptoms:Chest Pain.  Sonographer:    Charmaine Gaskins Referring Phys: [id number] SHENG L HALEY IMPRESSIONS  1. Left ventricular ejection fraction, by estimation, is 65 to 70%. The left ventricle has normal function. The left ventricle has no regional wall motion abnormalities. Left ventricular diastolic parameters are consistent with Grade I diastolic dysfunction (impaired relaxation).  2. Right ventricular systolic function is normal. The right ventricular size is normal. Tricuspid regurgitation signal is inadequate for assessing PA pressure.  3. The mitral valve is normal in structure. Trivial mitral valve regurgitation. No evidence of mitral stenosis.  4. The aortic valve was not well visualized. Aortic valve regurgitation is not visualized. No aortic stenosis is present.  5. The inferior vena cava is normal in size with greater than 50% respiratory variability, suggesting right atrial pressure of 3 mmHg.  6. Cannot exclude a small PFO. FINDINGS  Left Ventricle: Left ventricular ejection fraction, by estimation, is 65 to 70%. The left ventricle has normal function. The left ventricle has no regional wall motion abnormalities. The left ventricular internal cavity size was normal in size. There is  no left ventricular hypertrophy. Left ventricular diastolic parameters are consistent with Grade I diastolic dysfunction (impaired relaxation). Right Ventricle: The right ventricular size is normal. No increase in right ventricular wall thickness. Right ventricular systolic function is normal. Tricuspid  regurgitation signal is inadequate for assessing PA pressure. Left Atrium: Left atrial size was normal in size. Right Atrium: Right atrial size was normal in size. Pericardium: There is no evidence of pericardial effusion. Mitral Valve: The mitral valve is normal in structure. Trivial mitral valve regurgitation. No evidence of mitral valve stenosis. Tricuspid Valve: The tricuspid valve is normal in structure. Tricuspid valve regurgitation is trivial. Aortic Valve: The aortic valve was not well visualized. Aortic valve regurgitation is not visualized. No aortic stenosis is present. Pulmonic Valve: The pulmonic valve was not well visualized. Pulmonic valve regurgitation is not visualized. Aorta: The aortic root and ascending aorta are structurally normal, with no evidence of dilitation. Venous: The inferior vena cava is normal in size with greater than 50% respiratory variability, suggesting right atrial pressure of 3 mmHg. IAS/Shunts: Cannot exclude a small PFO.  LEFT VENTRICLE PLAX 2D LVIDd:         4.00 cm     Diastology LVIDs:         2.10 cm     LV e' medial:    6.74 cm/s LV PW:         0.70 cm     LV E/e' medial:  11.2 LV IVS:        0.70 cm     LV e' lateral:   8.27 cm/s LVOT diam:     1.90 cm     LV E/e' lateral: 9.1 LVOT Area:     2.84 cm  LV Volumes (MOD) LV vol d, MOD A2C: 60.7 ml LV vol d, MOD A4C: 48.4 ml LV vol s, MOD A2C: 16.5 ml LV vol s, MOD A4C: 15.9 ml LV SV MOD A2C:     44.2 ml LV SV MOD A4C:     48.4 ml LV SV MOD BP:  38.2 ml RIGHT VENTRICLE RV Basal diam:  3.00 cm RV Mid diam:    2.80 cm RV S prime:     13.40 cm/s LEFT ATRIUM             Index        RIGHT ATRIUM          Index LA diam:        2.20 cm 1.45 cm/m   RA Area:     9.69 cm LA Vol (A2C):   18.4 ml 12.13 ml/m  RA Volume:   20.00 ml 13.18 ml/m LA Vol (A4C):   28.7 ml 18.92 ml/m LA Biplane Vol: 24.3 ml 16.02 ml/m   AORTA Ao Root diam: 3.10 cm Ao Asc diam:  2.50 cm MITRAL VALVE MV Area (PHT): 4.15 cm    SHUNTS MV Decel Time: 183  msec    Systemic Diam: 1.90 cm MV E velocity: 75.50 cm/s MV A velocity: 94.30 cm/s MV E/A ratio:  0.80 Monica Nanas MD Electronically signed by Monica Nanas MD Signature Date/Time: 04/24/2024/10:44:26 AM    Final    CT CARDIAC SCORING (SELF PAY ONLY) Addendum Date: 04/23/2024 ADDENDUM REPORT: 04/23/2024 09:49 EXAM: OVER-READ INTERPRETATION  CT CHEST The following report is an over-read performed by radiologist Dr. Ree Levy [facility] on 04/23/2024. This over-read does not include interpretation of cardiac or coronary anatomy or pathology. The coronary calcium  score interpretation by the cardiologist is attached. COMPARISON:  CT scan chest from 12/10/2017. FINDINGS: Mediastinum/Nodes: No solid / cystic mediastinal masses. The visualized esophagus is nondistended precluding optimal assessment. No thoracic lymphadenopathy by size criteria. Lungs/Pleura: Imaged tracheo-bronchial tree is patent. There is small grouping of tree-in-bud configuration nodules and associated linear areas of atelectasis/scarring in the inferior lingular segment of left upper lobe and similar characteristic smaller area in the middle lobe. Findings are grossly unchanged since the prior study dating back to 2019 and may represent combination of background scarring with or without superimposed pneumonitis. Correlate clinically. No new mass or consolidation. No pleural effusion or pneumothorax. No suspicious lung nodules. Upper Abdomen: Visualized upper abdominal viscera within normal limits. Musculoskeletal: The visualized soft tissues of the chest wall are grossly unremarkable. No suspicious osseous lesions. IMPRESSION: 1. There is small grouping of tree-in-bud configuration nodules and associated linear areas of atelectasis/scarring in the inferior lingular segment of left upper lobe and similar characteristic smaller area in the middle lobe. Findings are grossly unchanged since the prior study dating back  to 2019 and may represent combination of background scarring with or without superimposed pneumonitis. Correlate clinically. 2. No other acute or significant extracardiac findings. Electronically Signed   By: Ree Molt M.D.   On: 04/23/2024 09:49   Result Date: 04/23/2024 CLINICAL DATA:  Cardiovascular Disease Risk stratification EXAM: Coronary Calcium  Score TECHNIQUE: A gated, non-contrast computed tomography scan of the heart was performed using 2.5 mm slice thickness. Axial images were analyzed on a dedicated workstation. Calcium  scoring of the coronary arteries was performed using the Agatston method. FINDINGS: Coronary Calcium  Score: Left main: 0 Left anterior descending artery: 326 Left circumflex artery: 126 Right coronary artery: 182 Total: 634 Percentile: 95 Pericardium: Normal. Non-cardiac: See separate report from [facility]. IMPRESSION: 1. Coronary calcium  score of 634. This was 95 percentile for age-, race-, and sex-matched controls. RECOMMENDATIONS: Coronary artery calcium  (CAC) score is a strong predictor of incident coronary heart disease (CHD) and provides predictive information beyond traditional risk factors. CAC scoring is reasonable to use in the  decision to withhold, postpone, or initiate statin therapy in intermediate-risk or selected borderline-risk asymptomatic adults (age 35-75 years and LDL-C >=70 to <190 mg/dL) who do not have diabetes or established atherosclerotic cardiovascular disease (ASCVD).* In intermediate-risk (10-year ASCVD risk >=7.5% to <20%) adults or selected borderline-risk (10-year ASCVD risk >=5% to <7.5%) adults in whom a CAC score is measured for the purpose of making a treatment decision the following recommendations have been made: If CAC=0, it is reasonable to withhold statin therapy and reassess in 5 to 10 years, as long as higher risk conditions are absent (diabetes mellitus, family history of premature CHD in first degree relatives (males <55  years; females <65 years), cigarette smoking, or LDL >=190 mg/dL). If CAC is 1 to 99, it is reasonable to initiate statin therapy for patients >=68 years of age. If CAC is >=100 or >=75th percentile, it is reasonable to initiate statin therapy at any age. Cardiology referral should be considered for patients with CAC scores >=400 or >=75th percentile. *2018 AHA/ACC/AACVPR/AAPA/ABC/ACPM/ADA/AGS/APhA/ASPC/NLA/PCNA Guideline on the Management of Blood Cholesterol: A Report of the American College of Cardiology/American Heart Association Task Force on Clinical Practice Guidelines. J Am Coll Cardiol. 2019;73(24):3168-3209. Redell Shallow, MD Electronically Signed: By: Redell Shallow M.D. On: 04/17/2024 08:21   DG Chest 2 View Result Date: 04/22/2024 CLINICAL DATA:  Chest pain EXAM: CHEST - 2 VIEW COMPARISON:  04/21/2024, 03/25/2024 FINDINGS: Bronchitic changes without focal airspace opacity, pleural effusion or pneumothorax. Slightly diminished nodular opacity in the right upper lung. Normal cardiac size. No pneumothorax IMPRESSION: No active cardiopulmonary disease. Bronchitic changes. Electronically Signed   By: Luke Bun M.D.   On: 04/22/2024 20:09   DG Chest 2 View Result Date: 04/21/2024 EXAM: 2 VIEW(S) XRAY OF THE CHEST 04/21/2024 01:03:51 PM COMPARISON: 03/25/2024 CLINICAL HISTORY: Chest pain x1 month, increases with activity. No SOB at this time. FINDINGS: LUNGS AND PLEURA: Diffuse bilateral interstitial prominence. Stable right upper lobe nodular density. No pulmonary edema. No pleural effusion. No pneumothorax. HEART AND MEDIASTINUM: No acute abnormality of the cardiac and mediastinal silhouettes. BONES AND SOFT TISSUES: No acute osseous abnormality. IMPRESSION: 1. No acute findings. 2. Diffuse bilateral interstitial prominence. 3. Stable right upper lobe nodular density. Electronically signed by: Waddell Calk MD 04/21/2024 01:19 PM EDT RP Workstation: [id number]    Disposition Patient is being  discharged home today in good condition.  Follow-up Plans & Appointments  Follow-up Information     West, Katlyn D, NP Follow up.   Specialty: Cardiology Why: Hospital follow-up with Cardiology scheduled for 05/05/2024 at 2:45pm. Please arrive 20 minutes early for check-in. If this date/ time does not work for you, please call our office to reschedule. Contact information: [address] [place] [address] [phone]                Discharge Instructions     Amb Referral to Cardiac Rehabilitation   Complete by: As directed    Diagnosis: Coronary Stents   After initial evaluation and assessments completed: Virtual Based Care may be provided alone or in conjunction with Phase 2 Cardiac Rehab based on patient barriers.: Yes   Intensive Cardiac Rehabilitation (ICR) [facility] location only OR Traditional Cardiac Rehabilitation (TCR) *If criteria for ICR are not met will enroll in TCR [facility] only): Yes   Diet - low sodium heart healthy   Complete by: As directed    Increase activity slowly   Complete by: As directed        Discharge Medications Allergies as  of 04/25/2024   No Known Allergies      Medication List     STOP taking these medications    alum & mag hydroxide-simeth 200-200-20 MG/5ML suspension Commonly known as: MAALOX/MYLANTA   dicyclomine  10 MG capsule Commonly known as: BENTYL    HYDROcodone -acetaminophen  5-325 MG tablet Commonly known as: NORCO/VICODIN   meloxicam 7.5 MG tablet Commonly known as: MOBIC   ondansetron  4 MG disintegrating tablet Commonly known as: Zofran  ODT   pantoprazole  40 MG tablet Commonly known as: PROTONIX    Pylera  140-125-125 MG Caps Generic drug: Bismuth/Metronidaz/Tetracyclin       TAKE these medications    aspirin  EC 81 MG tablet Take 1 tablet (81 mg total) by mouth daily. Swallow whole.   atorvastatin  80 MG tablet Commonly known as: LIPITOR Take 80 mg by mouth daily.   nitroGLYCERIN  0.4 MG SL  tablet Commonly known as: NITROSTAT  Place 0.4 mg under the tongue every 5 (five) minutes as needed.   ticagrelor  90 MG Tabs tablet Commonly known as: BRILINTA  Take 1 tablet (90 mg total) by mouth 2 (two) times daily.   Ventolin HFA 108 (90 Base) MCG/ACT inhaler Generic drug: albuterol Take 1-2 puffs by mouth daily. Inhale 1-2 puffs by mouth every 4 to 6 hours as needed for shortness of breath or wheezing         Outstanding Labs/Studies - Repeat lipid panel and LFTs in 6-8 weeks.  Duration of Discharge Encounter: APP Time: 25 minutes   Signed, Callie E Goodrich, PA-C 04/25/2024, 12:24 PM    Progress Note  Patient Name: Monica Spence Date of Encounter: 04/25/2024 [facility] Cardiologist: Shelda Bruckner, MD   Interval Summary   Feels well. Had radial site patient yesterday 10/10 with hematoma but this has all resolved and site looks good. No residual pain, mild ecchymosis.   Vital Signs Vitals:   04/25/24 0000 04/25/24 0502 04/25/24 0818 04/25/24 1156  BP: 136/89 131/84 (!) 153/74 (!) 133/93  Pulse: 67 64 73 75  Resp: 18 18 18 18   Temp: 98 F (36.7 C) 98.6 F (37 C) 98.4 F (36.9 C) 98 F (36.7 C)  TempSrc: Oral Oral Oral Oral  SpO2: 100% 99% 100% 93%  Weight:      Height:        Intake/Output Summary (Last 24 hours) at 04/25/2024 1237 Last data filed at 04/25/2024 1030 Gross per 24 hour  Intake 980 ml  Output --  Net 980 ml      04/22/2024    7:48 PM 05/27/2019   12:43 PM 04/07/2019    1:43 PM  Last 3 Weights  Weight (lbs) 113 lb 130 lb 127 lb  Weight (kg) 51.256 kg 58.968 kg 57.607 kg      Telemetry/ECG  SR - Personally Reviewed  Physical Exam  GEN: No acute distress.   Neck: No JVD Cardiac: RRR, no murmurs, rubs, or gallops. Radial site pulse 4/4, mild ecchymosis, no hematoma. Respiratory: Clear to auscultation bilaterally. GI: Soft, nontender, non-distended  MS: No edema  Assessment & Plan  #unstable angina #abnormal  coronary CTA #obstructive CAD  -asa 81 mg daily, brilinta  90 mg BID. -I have instructed the patient that dual antiplatelet therapy should be taken for 1 year without interruption.  We have discussed the consequences of interrupted dual antiplatelet therapy and the risk for in-stent thrombosis.  -discussed importance of cardiac rehab - continue atorva 80 mg daily  Stable for hospital discharge today, follow up arranged 05/05/24.   I  spent 35 minutes in the care of Sharmane Dame today including reviewing labs (9/12), reviewing studies (CCTA, cath, echo), face to face time discussing treatment options (25 min), and documenting in the encounter.   Medical Readiness Date: 04/24/2024    For questions or updates, please contact [facility] Please consult [email or web address] for contact info under         Signed, Monzerrath Mcburney A Corley Maffeo, MD

## 2024-04-25 NOTE — Plan of Care (Signed)
 Problem: Education: Goal: Knowledge of General Education information will improve Description: Including pain rating scale, medication(s)/side effects and non-pharmacologic comfort measures Outcome: Adequate for Discharge   Problem: Health Behavior/Discharge Planning: Goal: Ability to manage health-related needs will improve Outcome: Adequate for Discharge   Problem: Clinical Measurements: Goal: Ability to maintain clinical measurements within normal limits will improve Outcome: Adequate for Discharge Goal: Will remain free from infection Outcome: Adequate for Discharge Goal: Diagnostic test results will improve Outcome: Adequate for Discharge Goal: Respiratory complications will improve Outcome: Adequate for Discharge Goal: Cardiovascular complication will be avoided Outcome: Adequate for Discharge   Problem: Activity: Goal: Risk for activity intolerance will decrease Outcome: Adequate for Discharge   Problem: Nutrition: Goal: Adequate nutrition will be maintained Outcome: Adequate for Discharge   Problem: Coping: Goal: Level of anxiety will decrease Outcome: Adequate for Discharge   Problem: Elimination: Goal: Will not experience complications related to bowel motility Outcome: Adequate for Discharge Goal: Will not experience complications related to urinary retention Outcome: Adequate for Discharge   Problem: Pain Managment: Goal: General experience of comfort will improve and/or be controlled Outcome: Adequate for Discharge   Problem: Safety: Goal: Ability to remain free from injury will improve Outcome: Adequate for Discharge   Problem: Skin Integrity: Goal: Risk for impaired skin integrity will decrease Outcome: Adequate for Discharge   Problem: Education: Goal: Understanding of cardiac disease, CV risk reduction, and recovery process will improve Outcome: Adequate for Discharge Goal: Individualized Educational Video(s) Outcome: Adequate for Discharge    Problem: Activity: Goal: Ability to tolerate increased activity will improve Outcome: Adequate for Discharge   Problem: Cardiac: Goal: Ability to achieve and maintain adequate cardiovascular perfusion will improve Outcome: Adequate for Discharge   Problem: Health Behavior/Discharge Planning: Goal: Ability to safely manage health-related needs after discharge will improve Outcome: Adequate for Discharge   Problem: Education: Goal: Understanding of cardiac disease, CV risk reduction, and recovery process will improve Outcome: Adequate for Discharge Goal: Individualized Educational Video(s) Outcome: Adequate for Discharge   Problem: Activity: Goal: Ability to tolerate increased activity will improve Outcome: Adequate for Discharge   Problem: Cardiac: Goal: Ability to achieve and maintain adequate cardiovascular perfusion will improve Outcome: Adequate for Discharge   Problem: Health Behavior/Discharge Planning: Goal: Ability to safely manage health-related needs after discharge will improve Outcome: Adequate for Discharge   Problem: Education: Goal: Understanding of CV disease, CV risk reduction, and recovery process will improve Outcome: Adequate for Discharge Goal: Individualized Educational Video(s) Outcome: Adequate for Discharge   Problem: Activity: Goal: Ability to return to baseline activity level will improve Outcome: Adequate for Discharge   Problem: Cardiovascular: Goal: Ability to achieve and maintain adequate cardiovascular perfusion will improve Outcome: Adequate for Discharge Goal: Vascular access site(s) Level 0-1 will be maintained Outcome: Adequate for Discharge   Problem: Health Behavior/Discharge Planning: Goal: Ability to safely manage health-related needs after discharge will improve Outcome: Adequate for Discharge   Problem: Education: Goal: Understanding of CV disease, CV risk reduction, and recovery process will improve Outcome: Adequate for  Discharge Goal: Individualized Educational Video(s) Outcome: Adequate for Discharge   Problem: Activity: Goal: Ability to return to baseline activity level will improve Outcome: Adequate for Discharge   Problem: Cardiovascular: Goal: Ability to achieve and maintain adequate cardiovascular perfusion will improve Outcome: Adequate for Discharge Goal: Vascular access site(s) Level 0-1 will be maintained Outcome: Adequate for Discharge   Problem: Health Behavior/Discharge Planning: Goal: Ability to safely manage health-related needs after discharge will improve Outcome: Adequate for Discharge

## 2024-04-25 NOTE — Discharge Instructions (Addendum)
 Medication Changes: - START Aspirin  81mg  once daily and Brilinta  90mg  twice daily. These medications are very important and help keep the new stent in your heart open. - CONTINUE Atorvastatin  (Lipitor) 80mg  daily. - Recommend avoiding NSAIDS (such as Meloxicam, Ibuprofen, Motrin, Advil, Aleve) given underlying heart disease. Recommend using over-the-counter Acetaminophen  (Tylenol ) as needed for pain instead. - Multiple medications (Maalox/ Mylanta, Bentyl , Hydrocodone -acetaminophen , Zofran , Protonix , Pylera )  were discontinued at discharge since you reported no longer taking these on admission.  Post Cardiac Catheterization: NO HEAVY LIFTING OR SEXUAL ACTIVITY X 7 DAYS. NO DRIVING X 3-5 DAYS. NO SOAKING BATHS, HOT TUBS, POOLS, ETC., X 7 DAYS.   Radial Site Care: Refer to this sheet in the next few weeks. These instructions provide you with information on caring for yourself after your procedure. Your caregiver may also give you more specific instructions. Your treatment has been planned according to current medical practices, but problems sometimes occur. Call your caregiver if you have any problems or questions after your procedure. HOME CARE INSTRUCTIONS You may shower the day after the procedure. Remove the bandage (dressing) and gently wash the site with plain soap and water . Gently pat the site dry.  Do not apply powder or lotion to the site.  Do not submerge the affected site in water  for 3 to 5 days.  Inspect the site at least twice daily.  Do not flex or bend the affected arm for 24 hours.  No lifting over 5 pounds (2.3 kg) for 5 days after your procedure.  Do not drive home if you are discharged the same day of the procedure. Have someone else drive you.  What to expect: Any bruising will usually fade within 1 to 2 weeks.  Blood that collects in the tissue (hematoma) may be painful to the touch. It should usually decrease in size and tenderness within 1 to 2 weeks.  SEEK IMMEDIATE  MEDICAL CARE IF: You have unusual pain at the radial site.  You have redness, warmth, swelling, or pain at the radial site.  You have drainage (other than a small amount of blood on the dressing).  You have chills.  You have a fever or persistent symptoms for more than 72 hours.  You have a fever and your symptoms suddenly get worse.  Your arm becomes pale, cool, tingly, or numb.  You have heavy bleeding from the site. Hold pressure on the site.

## 2024-04-25 NOTE — Progress Notes (Signed)
 CARDIAC REHAB PHASE I     Post stent education including site care, restrictions, risk factors, exercise guidelines, NTG use, antiplatelet therapy importance, heart healthy diet and CRP2 reviewed. All questions and concerns addressed. Will refer to AP for CRP2. Plan for home later today.     Vaughn Asberry Hacking, RN BSN 04/25/2024 9:47 AM

## 2024-04-25 NOTE — Progress Notes (Signed)
 Patient woke up complaining of right radial site pain. Right radial site looks slightly swollen but area is soft. Pressure applied on the site per protocol. Notified Cardiology MD. After applying pressure, patient continues to complain of pain on the area. Radial site is soft, with +2 palpable pulses, capillary refill time < 3. Informed Dr Donnel and ordered for oxycodone .

## 2024-05-04 NOTE — Progress Notes (Unsigned)
 Cardiology Office Note    Date:  05/05/2024  ID:  Monica Spence, Fesler [birth year], MRN [id number] PCP:  Shona Norleen PEDLAR, MD  Cardiologist:  Shelda Bruckner, MD  Electrophysiologist:  None   Chief Complaint: Follow up for CAD   History of Present Illness: .    Monica Spence is a 68 y.o. female with visit-pertinent history of CAD, prior tobacco use.  On 9/10 patient was initially seen at [facility] for chest pain and negative troponins.  EKG without any acute ST-T wave changes.  She was transferred to [facility] for evaluation of chest pain/presumed unstable angina.  Patient evaluated by Dr. Bruckner in the ED on 04/23/2024.  It was noted the time that patient had no prior cardiac history, noted to have been a smoker for 15+ years but had quit smoking 4 to 5 years prior.  Patient had been reportedly having intermittent chest pain and had a coronary calcification score of 634 and was started on atorvastatin  80 mg daily.  Patient reported that she was previously very active and almost went on daily walks with her dog in the neighborhood however for the prior 2 weeks he been noting increased shortness of breath with activity also reporting exertional chest pain with radiation into her shoulders.  Troponins were negative x 2.  Coronary CTA indicated coronary calcium  score of 555, this was 94th percentile for age, sex and race matched control.  Patient noted to have severe stenosis with low attenuation and vulnerable plaque, cardiac catheterization was recommended, CT FFR analysis showed evidence of significant functional stenosis in mid distal RCA.  Cardiac catheterization on 04/24/2024 showed high-grade mid right coronary lesion treated with a 3.0 x 32 mm Synergy XD stent postdilated to 3.25 mm, mild LAD disease.  DAPT with aspirin  and Brilinta  for 1 year followed by Brilinta  monotherapy indefinitely was recommended.  Patient was discharged on 04/25/2024 in stable condition.  Today  she presents for follow-up.  She reports that she is doing well. She denies any further chest or arm pain. She denies increased shortness of breath, lower extremity edema, orthopnea or pnd. She does not some rare palpitations lasting a few seconds, denies any associated symptoms. She denies any dizziness, lightheadedness, presycnope or syncope.  She reports that she is tolerating her medications well. ROS: .   Today she denies chest pain, shortness of breath, lower extremity edema, fatigue, palpitations, melena, hematuria, hemoptysis, diaphoresis, weakness, presyncope, syncope, orthopnea, and PND.  All other systems are reviewed and otherwise negative. Studies Reviewed: SABRA   EKG:  EKG is ordered today, personally reviewed, demonstrating  EKG Interpretation Date/Time:  Tuesday May 05 2024 14:23:25 EDT Ventricular Rate:  67 PR Interval:  142 QRS Duration:  70 QT Interval:  388 QTC Calculation: 409 R Axis:   24  Text Interpretation: Sinus rhythm with Premature atrial complexes When compared with ECG of 25-Apr-2024 08:27, Premature atrial complexes are now Present QT has shortened Confirmed by Tavyn Kurka [phone]) on 05/05/2024 2:36:44 PM   CV Studies: Cardiac studies reviewed are outlined and summarized above. Otherwise please see EMR for full report. Cardiac Studies & Procedures   ______________________________________________________________________________________________ CARDIAC CATHETERIZATION  CARDIAC CATHETERIZATION 04/24/2024  Conclusion   Mid RCA to Dist RCA lesion is 90% stenosed.   Prox LAD to Mid LAD lesion is 40% stenosed.   A stent was successfully placed.   Post intervention, there is a 0% residual stenosis.  1.  High-grade mid right coronary lesion treated with  a 3.0 x 32 mm Synergy XD stent postdilated to 3.25 mm. 2.  Mild LAD disease. 3.  LVEDP of 9 mmHg  Recommendation: Dual antiplatelet therapy with aspirin  and Brilinta  for 1 year then Brilinta  monotherapy  indefinitely.  Findings Coronary Findings Diagnostic  Dominance: Right  Left Anterior Descending The vessel exhibits minimal luminal irregularities. Prox LAD to Mid LAD lesion is 40% stenosed.  Right Coronary Artery There is mild diffuse disease throughout the vessel. Mid RCA to Dist RCA lesion is 90% stenosed.  Intervention  Mid RCA to Dist RCA lesion Stent A stent was successfully placed. Post-Intervention Lesion Assessment The intervention was successful. Pre-interventional TIMI flow is 3. Post-intervention TIMI flow is 3. There is a 0% residual stenosis post intervention.     ECHOCARDIOGRAM  ECHOCARDIOGRAM COMPLETE 04/24/2024  Narrative ECHOCARDIOGRAM REPORT    Patient Name:   Monica Spence Date of Exam: 04/24/2024 Medical Rec #:  [id number]        Height:       63.0 in Accession #:    [id number]       Weight:       113.0 lb Date of Birth:  10-18-1955        BSA:          1.517 m Patient Age:    68 years         BP:           145/118 mmHg Patient Gender: F                HR:           59 bpm. Exam Location:  Inpatient  Procedure: 2D Echo (Both Spectral and Color Flow Doppler were utilized during procedure).  Indications:    Chest pain  History:        Patient has no prior history of Echocardiogram examinations. Signs/Symptoms:Chest Pain.  Sonographer:    Charmaine Gaskins Referring Phys: [id number] SHENG L HALEY  IMPRESSIONS   1. Left ventricular ejection fraction, by estimation, is 65 to 70%. The left ventricle has normal function. The left ventricle has no regional wall motion abnormalities. Left ventricular diastolic parameters are consistent with Grade I diastolic dysfunction (impaired relaxation). 2. Right ventricular systolic function is normal. The right ventricular size is normal. Tricuspid regurgitation signal is inadequate for assessing PA pressure. 3. The mitral valve is normal in structure. Trivial mitral valve regurgitation. No evidence of mitral  stenosis. 4. The aortic valve was not well visualized. Aortic valve regurgitation is not visualized. No aortic stenosis is present. 5. The inferior vena cava is normal in size with greater than 50% respiratory variability, suggesting right atrial pressure of 3 mmHg. 6. Cannot exclude a small PFO.  FINDINGS Left Ventricle: Left ventricular ejection fraction, by estimation, is 65 to 70%. The left ventricle has normal function. The left ventricle has no regional wall motion abnormalities. The left ventricular internal cavity size was normal in size. There is no left ventricular hypertrophy. Left ventricular diastolic parameters are consistent with Grade I diastolic dysfunction (impaired relaxation).  Right Ventricle: The right ventricular size is normal. No increase in right ventricular wall thickness. Right ventricular systolic function is normal. Tricuspid regurgitation signal is inadequate for assessing PA pressure.  Left Atrium: Left atrial size was normal in size.  Right Atrium: Right atrial size was normal in size.  Pericardium: There is no evidence of pericardial effusion.  Mitral Valve: The mitral valve is normal in structure. Trivial mitral valve regurgitation. No  evidence of mitral valve stenosis.  Tricuspid Valve: The tricuspid valve is normal in structure. Tricuspid valve regurgitation is trivial.  Aortic Valve: The aortic valve was not well visualized. Aortic valve regurgitation is not visualized. No aortic stenosis is present.  Pulmonic Valve: The pulmonic valve was not well visualized. Pulmonic valve regurgitation is not visualized.  Aorta: The aortic root and ascending aorta are structurally normal, with no evidence of dilitation.  Venous: The inferior vena cava is normal in size with greater than 50% respiratory variability, suggesting right atrial pressure of 3 mmHg.  IAS/Shunts: Cannot exclude a small PFO.   LEFT VENTRICLE PLAX 2D LVIDd:         4.00 cm      Diastology LVIDs:         2.10 cm     LV e' medial:    6.74 cm/s LV PW:         0.70 cm     LV E/e' medial:  11.2 LV IVS:        0.70 cm     LV e' lateral:   8.27 cm/s LVOT diam:     1.90 cm     LV E/e' lateral: 9.1 LVOT Area:     2.84 cm  LV Volumes (MOD) LV vol d, MOD A2C: 60.7 ml LV vol d, MOD A4C: 48.4 ml LV vol s, MOD A2C: 16.5 ml LV vol s, MOD A4C: 15.9 ml LV SV MOD A2C:     44.2 ml LV SV MOD A4C:     48.4 ml LV SV MOD BP:      38.2 ml  RIGHT VENTRICLE RV Basal diam:  3.00 cm RV Mid diam:    2.80 cm RV S prime:     13.40 cm/s  LEFT ATRIUM             Index        RIGHT ATRIUM          Index LA diam:        2.20 cm 1.45 cm/m   RA Area:     9.69 cm LA Vol (A2C):   18.4 ml 12.13 ml/m  RA Volume:   20.00 ml 13.18 ml/m LA Vol (A4C):   28.7 ml 18.92 ml/m LA Biplane Vol: 24.3 ml 16.02 ml/m  AORTA Ao Root diam: 3.10 cm Ao Asc diam:  2.50 cm  MITRAL VALVE MV Area (PHT): 4.15 cm    SHUNTS MV Decel Time: 183 msec    Systemic Diam: 1.90 cm MV E velocity: 75.50 cm/s MV A velocity: 94.30 cm/s MV E/A ratio:  0.80  Lonni Nanas MD Electronically signed by Lonni Nanas MD Signature Date/Time: 04/24/2024/10:44:26 AM    Final      CT SCANS  CT CORONARY MORPH W/CTA COR W/SCORE 04/24/2024  Addendum 04/24/2024 12:07 PM ADDENDUM REPORT: 04/24/2024 12:04  EXAM: OVER-READ INTERPRETATION CT CHEST  The following report is an over-read performed by radiologist Dr. Rogelia Myers of [facility] on 04/24/2024. This over-read does not include interpretation of cardiac or coronary anatomy or pathology. The coronary CTA interpretation by the cardiologist is attached.  COMPARISON:  04/17/2024, 12/10/2017  FINDINGS: Pulmonary Embolism: While the exam was not optimized for the evaluation of the pulmonary arteries, no central pulmonary embolism visualized.  Cardiovascular: Normal appearance of extracardiac vascular structures. No pericardial  effusion. No aortic aneurysm.  Mediastinum/Nodes: No mediastinal mass. No mediastinal or hilar lymphadenopathy. Normal esophagus.  Lungs/Pleura: The midline trachea and bronchi are  patent. Redemonstrated reticulation and scarring in the right middle lobe and lingula. Superimposed tree-in-bud nodularity in the lingula and right middle lobe is similar to the most recent comparison, but felt to be more conspicuous than on the chest CT from since 2019. No lobar consolidation, pleural effusion, or pneumothorax.  Musculoskeletal: No acute fracture or destructive bone lesion. Multilevel thoracic osteophytosis.  Upper Abdomen: No acute abnormality in the partially visualized upper abdomen.  IMPRESSION: Redemonstrated reticulation and scarring in the right middle lobe and lingula with superimposed tree-in-bud nodularity. While the tree-in-bud nodularity is similar to the most recent comparison, it is felt to be more conspicuous than on the chest CT performed in 2019. If there is a history of chronic cough, this may reflect changes of an atypical infection, such as mycobacterium avium complex. Laboratory correlation recommended.   Electronically Signed By: Rogelia Myers M.D. On: 04/24/2024 12:04  Narrative CLINICAL DATA:  68 Year-old Female  EXAM: Cardiac/Coronary  CTA  TECHNIQUE: Axial non-contrast 3 mm slices were carried out through the heart. The data set was analyzed on a dedicated work station and scored using the Agatson method. Gantry rotation speed was 250 msecs and collimation was .6 mm. 0.8 mg of sl NTG was given. The 3D data set was reconstructed in 5% intervals of the 35-75% of the R-R cycle. The patient received 100 cc of contrast.  FINDINGS: Coronary Arteries:  Normal coronary origin.  Right dominance.  Coronary Calcium  Score:  Left main: 0  Left anterior descending artery: 292  Left circumflex artery: 118  Right coronary artery: 145  Total:  555  Percentile: 94th for age, sex, and race matched control.  Left main: The left main is a large caliber vessel with a normal take off from the left coronary cusp that bifurcates to form a left anterior descending artery and a left circumflex artery. There is no significant plaque.  Left anterior descending artery: The LAD is a large caliber vessel with one large diagonal vessel with multiple branches. Mild non-obstructive mixed plaques (25-49%) in proximal LAD. Moderate calcified stenosis (50-70%) in the mid LAD prior to a mild soft plaque stenosis (vulnerable plaque). Mild non-obstructive mixed plaques (25-49%) throughout the D1.  Left circumflex artery: The LCX is non-dominant with one obtuse marginal. Minimal non-obstructive calcified plaques (1-24%) in the proximal LCX, mid LCX, and proximal OM1.  Right coronary artery: The RCA is dominant with normal take off from the right coronary cusp. the RCA terminates as a PDA and right posterolateral branch. Mild mixed plaque in the mid RCA superior to a severe soft plaque stenosis (70-99%). There is low attenuation plaque (vulnerable plaque).  Other non-coronary findings:  Right Atrium: Right atrial size is within normal limits.  Right Ventricle: The right ventricular cavity is within normal limits.  Left Atrium: Left atrial size is normal in size with no left atrial appendage filling defect.  Left Ventricle: The ventricular cavity size is within normal limits.  Interatrial septum: Small PFO.  Main Pulmonary Artery: Normal size of the pulmonary artery.  Pulmonary veins: Normal pulmonary venous drainage.  Systemic veins: Normal venous anatomy.  Pericardium: Normal thickness without significant effusion or calcium  present.  Cardiac valves: The aortic valve is tri-leaflet without significant calcification. The mitral valve is normal without significant calcification and mild thickening.  Aorta: Normal caliber without  significant calcifications.  Extra-cardiac findings: See attached radiology report for non-cardiac structures.  Artifact: Slab  Image quality: Fair  IMPRESSION: 1. Coronary calcium  score of 555. This  was 94th percentile for age, sex, and race matched control.  2. Normal coronary origin with right dominance.  3. CAD-RADS 4a Severe stenosis with low attenuation and vulnerable plaque (V1) . (70-99%). Cardiac catheterization or CT FFR is recommended. Consider symptom-guided anti-ischemic pharmacotherapy as well as risk factor modification per guideline directed care.  RECOMMENDATIONS: RECOMMENDATIONS The proposed cut-off value of 1,651 AU yielded a 93 % sensitivity and 75 % specificity in grading AS severity in patients with classical low-flow, low-gradient AS. Proposed different cut-off values to define severe AS for men and women as 2,065 AU and 1,274 AU, respectively. The joint European and American recommendations for the assessment of AS consider the aortic valve calcium  score as a continuum - a very high calcium  score suggests severe AS and a low calcium  score suggests severe AS is unlikely.  Donney VEAR Jarome LULLA Stephen RENETTE, et al. 2017 ESC/EACTS Guidelines for the management of valvular heart disease. Eur Heart J 2017;38:2739-91.  Coronary artery calcium  (CAC) score is a strong predictor of incident coronary heart disease (CHD) and provides predictive information beyond traditional risk factors. CAC scoring is reasonable to use in the decision to withhold, postpone, or initiate statin therapy in intermediate-risk or selected borderline-risk asymptomatic adults (age 66-75 years and LDL-C >=70 to <190 mg/dL) who do not have diabetes or established atherosclerotic cardiovascular disease (ASCVD).* In intermediate-risk (10-year ASCVD risk >=7.5% to <20%) adults or selected borderline-risk (10-year ASCVD risk >=5% to <7.5%) adults in whom a CAC score is measured for the  purpose of making a treatment decision the following recommendations have been made:  If CAC = 0, it is reasonable to withhold statin therapy and reassess in 5 to 10 years, as long as higher risk conditions are absent (diabetes mellitus, family history of premature CHD in first degree relatives (males <55 years; females <65 years), cigarette smoking, LDL >=190 mg/dL or other independent risk factors).  If CAC is 1 to 99, it is reasonable to initiate statin therapy for patients >=11 years of age.  If CAC is >=100 or >=75th percentile, it is reasonable to initiate statin therapy at any age.  Cardiology referral should be considered for patients with CAC scores =400 or >=75th percentile.  *2018 AHA/ACC/AACVPR/AAPA/ABC/ACPM/ADA/AGS/APhA/ASPC/NLA/PCNA Guideline on the Management of Blood Cholesterol: A Report of the American College of Cardiology/American Heart Association Task Force on Clinical Practice Guidelines. J Am Coll Cardiol. 2019;73(24):3168-3209.  Electronically Signed: By: Stanly Leavens M.D. On: 04/24/2024 11:47   CT SCANS  CT CARDIAC SCORING (SELF PAY ONLY) 04/17/2024  Addendum 04/23/2024  9:52 AM ADDENDUM REPORT: 04/23/2024 09:49  EXAM: OVER-READ INTERPRETATION  CT CHEST  The following report is an over-read performed by radiologist Dr. Ree Levy [facility] on 04/23/2024. This over-read does not include interpretation of cardiac or coronary anatomy or pathology. The coronary calcium  score interpretation by the cardiologist is attached.  COMPARISON:  CT scan chest from 12/10/2017.  FINDINGS: Mediastinum/Nodes: No solid / cystic mediastinal masses. The visualized esophagus is nondistended precluding optimal assessment. No thoracic lymphadenopathy by size criteria.  Lungs/Pleura: Imaged tracheo-bronchial tree is patent. There is small grouping of tree-in-bud configuration nodules and associated linear areas of atelectasis/scarring  in the inferior lingular segment of left upper lobe and similar characteristic smaller area in the middle lobe. Findings are grossly unchanged since the prior study dating back to 2019 and may represent combination of background scarring with or without superimposed pneumonitis. Correlate clinically.  No new mass or consolidation. No pleural effusion or  pneumothorax. No suspicious lung nodules.  Upper Abdomen: Visualized upper abdominal viscera within normal limits.  Musculoskeletal: The visualized soft tissues of the chest wall are grossly unremarkable. No suspicious osseous lesions.  IMPRESSION: 1. There is small grouping of tree-in-bud configuration nodules and associated linear areas of atelectasis/scarring in the inferior lingular segment of left upper lobe and similar characteristic smaller area in the middle lobe. Findings are grossly unchanged since the prior study dating back to 2019 and may represent combination of background scarring with or without superimposed pneumonitis. Correlate clinically. 2. No other acute or significant extracardiac findings.   Electronically Signed By: Ree Molt M.D. On: 04/23/2024 09:49  Narrative CLINICAL DATA:  Cardiovascular Disease Risk stratification  EXAM: Coronary Calcium  Score  TECHNIQUE: A gated, non-contrast computed tomography scan of the heart was performed using 2.5 mm slice thickness. Axial images were analyzed on a dedicated workstation. Calcium  scoring of the coronary arteries was performed using the Agatston method.  FINDINGS: Coronary Calcium  Score:  Left main: 0  Left anterior descending artery: 326  Left circumflex artery: 126  Right coronary artery: 182  Total: 634  Percentile: 95  Pericardium: Normal.  Non-cardiac: See separate report from [facility].  IMPRESSION: 1. Coronary calcium  score of 634. This was 95 percentile for age-, race-, and sex-matched  controls.  RECOMMENDATIONS: Coronary artery calcium  (CAC) score is a strong predictor of incident coronary heart disease (CHD) and provides predictive information beyond traditional risk factors. CAC scoring is reasonable to use in the decision to withhold, postpone, or initiate statin therapy in intermediate-risk or selected borderline-risk asymptomatic adults (age 85-75 years and LDL-C >=70 to <190 mg/dL) who do not have diabetes or established atherosclerotic cardiovascular disease (ASCVD).* In intermediate-risk (10-year ASCVD risk >=7.5% to <20%) adults or selected borderline-risk (10-year ASCVD risk >=5% to <7.5%) adults in whom a CAC score is measured for the purpose of making a treatment decision the following recommendations have been made:  If CAC=0, it is reasonable to withhold statin therapy and reassess in 5 to 10 years, as long as higher risk conditions are absent (diabetes mellitus, family history of premature CHD in first degree relatives (males <55 years; females <65 years), cigarette smoking, or LDL >=190 mg/dL).  If CAC is 1 to 99, it is reasonable to initiate statin therapy for patients >=42 years of age.  If CAC is >=100 or >=75th percentile, it is reasonable to initiate statin therapy at any age.  Cardiology referral should be considered for patients with CAC scores >=400 or >=75th percentile.  *2018 AHA/ACC/AACVPR/AAPA/ABC/ACPM/ADA/AGS/APhA/ASPC/NLA/PCNA Guideline on the Management of Blood Cholesterol: A Report of the American College of Cardiology/American Heart Association Task Force on Clinical Practice Guidelines. J Am Coll Cardiol. 2019;73(24):3168-3209.  Redell Shallow, MD  Electronically Signed: By: Redell Shallow M.D. On: 04/17/2024 08:21     ______________________________________________________________________________________________       Current Reported Medications:.    Current Meds  Medication Sig   aspirin  EC 81 MG tablet Take  1 tablet (81 mg total) by mouth daily. Swallow whole.   atorvastatin  (LIPITOR) 80 MG tablet Take 80 mg by mouth daily.   nitroGLYCERIN  (NITROSTAT ) 0.4 MG SL tablet Place 0.4 mg under the tongue every 5 (five) minutes as needed.   ticagrelor  (BRILINTA ) 90 MG TABS tablet Take 1 tablet (90 mg total) by mouth 2 (two) times daily.   VENTOLIN HFA 108 (90 Base) MCG/ACT inhaler Take 1-2 puffs by mouth daily. Inhale 1-2 puffs by mouth every 4 to 6 hours  as needed for shortness of breath or wheezing    Physical Exam:   VS:  BP (!) 106/58 (BP Location: Left Arm, Patient Position: Sitting, Cuff Size: Normal)   Pulse 72   Ht 5' 4 (1.626 m)   Wt 116 lb (52.6 kg)   SpO2 97%   BMI 19.91 kg/m    Wt Readings from Last 3 Encounters:  05/05/24 116 lb (52.6 kg)  04/22/24 113 lb (51.3 kg)  05/27/19 130 lb (59 kg)    GEN: Well nourished, well developed in no acute distress NECK: No JVD; No carotid bruits CARDIAC: RRR, no murmurs, rubs, gallops RESPIRATORY:  Clear to auscultation without rales, wheezing or rhonchi  ABDOMEN: Soft, non-tender, non-distended EXTREMITIES:  No edema; No acute deformity     Asessement and Plan:.    CAD: Patient presented initially with increasing exertional chest pain and dyspnea.  High sensitive troponin was negative x 2.  Echo showed LVEF of 65 to 70% with no RWMA and grade 1 diastolic dysfunction.  Coronary CTA showed calcium  score of 555, 94th percentile for age and sex, severe stenosis with low attenuation of vulnerable plaque of mid RCA, FFR was positive.  LHC confirmed 90% stenosis of mid to distal RCA and 40% stenosis of proximal to mid LAD.  She underwent successful PCI with DES to mid RCA lesion.  She was started on DAPT with aspirin  and Brilinta  with plans to continue for 1 year then switch to Brilinta  monotherapy. Today she reports that she is doing well, denies any further chest or arm pain.  She denies shortness of breath. Cath site is clean and intact without evidence  of hematoma.  Patient to start cardiac rehab. Reviewed ED precautions. Continue aspirin  81 mg daily, Lipitor 80 mg daily and Brilinta  90 mg twice daily.  Hyperlipidemia: Lipid profile during admission indicated total cholesterol 208, triglycerides 119, HDL 52, LDL 132.  LDL goal less than 70 given CAD.  Patient recently prescribed Lipitor 80 mg daily.  Check fasting lipid profile and LFTs in 6 to 8 weeks.    Cardiac Rehabilitation Eligibility Assessment  The patient is ready to start cardiac rehabilitation from a cardiac standpoint.   Disposition: F/u with Dr. Lonni or Nataliah Hatlestad, NP in 2-3 months or sooner if needed.   Signed, Laneisha Mino D Geraldean Walen, NP

## 2024-05-05 ENCOUNTER — Ambulatory Visit: Attending: Cardiology | Admitting: Cardiology

## 2024-05-05 VITALS — BP 106/58 | HR 72 | Ht 64.0 in | Wt 116.0 lb

## 2024-05-05 DIAGNOSIS — I2510 Atherosclerotic heart disease of native coronary artery without angina pectoris: Secondary | ICD-10-CM | POA: Diagnosis not present

## 2024-05-05 DIAGNOSIS — E7800 Pure hypercholesterolemia, unspecified: Secondary | ICD-10-CM | POA: Diagnosis not present

## 2024-05-05 NOTE — Patient Instructions (Signed)
 Thank you for choosing [facility]!     Medication Instructions:  No medication changes were made during today's visit.  *If you need a refill on your cardiac medications before your next appointment, please call your pharmacy*   Lab Work: Return for fasting labs in 6 to 8 weeks. If you have labs (blood work) drawn today and your tests are completely normal, you will receive your results only by: MyChart Message (if you have MyChart) OR A paper copy in the mail If you have any lab test that is abnormal or we need to change your treatment, we will call you to review the results.   Testing/Procedures: No procedures were ordered during today's visit.   Your next appointment:   3 month(s)   Provider:   Katlyn West, NP           Follow-Up: At [facility], you and your health needs are our priority.  As part of our continuing mission to provide you with exceptional heart care, we have created designated Provider Care Teams.  These Care Teams include your primary Cardiologist (physician) and Advanced Practice Providers (APPs -  Physician Assistants and Nurse Practitioners) who all work together to provide you with the care you need, when you need it. We recommend signing up for the patient portal called MyChart.  Sign up information is provided on this After Visit Summary.  MyChart is used to connect with patients for Virtual Visits (Telemedicine).  Patients are able to view lab/test results, encounter notes, upcoming appointments, etc.  Non-urgent messages can be sent to your provider as well.   To learn more about what you can do with MyChart, go to [email or web address].

## 2024-05-06 ENCOUNTER — Encounter: Payer: Self-pay | Admitting: Cardiology

## 2024-05-06 ENCOUNTER — Encounter (HOSPITAL_COMMUNITY): Payer: Self-pay

## 2024-05-08 ENCOUNTER — Encounter (HOSPITAL_COMMUNITY)
Admission: RE | Admit: 2024-05-08 | Discharge: 2024-05-08 | Disposition: A | Discharge status: Home / Self Care | Source: Ambulatory Visit | Attending: Internal Medicine | Admitting: Internal Medicine

## 2024-05-08 DIAGNOSIS — Z955 Presence of coronary angioplasty implant and graft: Secondary | ICD-10-CM | POA: Insufficient documentation

## 2024-05-08 NOTE — Progress Notes (Signed)
 Virtual orientation visit completed for cardiac rehab with stent placement. On-site orientation visit scheduled for 05/13/24.

## 2024-05-13 ENCOUNTER — Encounter (HOSPITAL_COMMUNITY)
Admission: RE | Admit: 2024-05-13 | Discharge: 2024-05-13 | Disposition: A | Discharge status: Home / Self Care | Source: Ambulatory Visit | Attending: Internal Medicine | Admitting: Internal Medicine

## 2024-05-13 ENCOUNTER — Encounter (HOSPITAL_COMMUNITY)

## 2024-05-13 VITALS — Ht 63.0 in | Wt 114.2 lb

## 2024-05-13 DIAGNOSIS — Z955 Presence of coronary angioplasty implant and graft: Secondary | ICD-10-CM | POA: Insufficient documentation

## 2024-05-13 NOTE — Patient Instructions (Signed)
 Patient Instructions  Patient Details  Name: Monica Spence MRN: [id number] Date of Birth: [birth year] Referring Provider:  Lonni Slain,*  Below are your personal goals for exercise, nutrition, and risk factors. Our goal is to help you stay on track towards obtaining and maintaining these goals. We will be discussing your progress on these goals with you throughout the program.  Initial Exercise Prescription:  Initial Exercise Prescription - 05/13/24 1400       Date of Initial Exercise RX and Referring Provider   Date 05/13/24    Referring Provider Slain Lonni MD      Treadmill   MPH 1.2    Grade 0    Minutes 15    METs 1.92      REL-XR   Level 1    Speed 50    Minutes 15    METs 1.9      Prescription Details   Frequency (times per week) 3    Duration Progress to 30 minutes of continuous aerobic without signs/symptoms of physical distress      Intensity   THRR 40-80% of Max Heartrate 104-136    Ratings of Perceived Exertion 11-13    Perceived Dyspnea 0-4      Resistance Training   Training Prescription Yes    Weight 3    Reps 10-15          Exercise Goals: Frequency: Be able to perform aerobic exercise two to three times per week in program working toward 2-5 days per week of home exercise.  Intensity: Work with a perceived exertion of 11 (fairly light) - 15 (hard) while following your exercise prescription.  We will make changes to your prescription with you as you progress through the program.   Duration: Be able to do 30 to 45 minutes of continuous aerobic exercise in addition to a 5 minute warm-up and a 5 minute cool-down routine.   Nutrition Goals: Your personal nutrition goals will be established when you do your nutrition analysis with the dietician.  The following are general nutrition guidelines to follow: Cholesterol < 200mg /day Sodium < 1500mg /day Fiber: Women over 50 yrs - 21 grams per day  Personal Goals:  Personal  Goals and Risk Factors at Admission - 05/08/24 1058       Core Components/Risk Factors/Patient Goals on Admission    Weight Management Weight Maintenance    Lipids Yes    Intervention Provide education and support for participant on nutrition & aerobic/resistive exercise along with prescribed medications to achieve LDL 70mg , HDL >40mg .    Expected Outcomes Short Term: Participant states understanding of desired cholesterol values and is compliant with medications prescribed. Participant is following exercise prescription and nutrition guidelines.;Long Term: Cholesterol controlled with medications as prescribed, with individualized exercise RX and with personalized nutrition plan. Value goals: LDL < 70mg , HDL > 40 mg.             Exercise Goals and Review:  Exercise Goals     Row Name 05/13/24 1502             Exercise Goals   Increase Physical Activity Yes       Intervention Provide advice, education, support and counseling about physical activity/exercise needs.;Develop an individualized exercise prescription for aerobic and resistive training based on initial evaluation findings, risk stratification, comorbidities and participant's personal goals.       Expected Outcomes Short Term: Attend rehab on a regular basis to increase amount of physical activity.;Long Term: Add in home  exercise to make exercise part of routine and to increase amount of physical activity.;Long Term: Exercising regularly at least 3-5 days a week.       Increase Strength and Stamina Yes       Intervention Provide advice, education, support and counseling about physical activity/exercise needs.;Develop an individualized exercise prescription for aerobic and resistive training based on initial evaluation findings, risk stratification, comorbidities and participant's personal goals.       Expected Outcomes Short Term: Increase workloads from initial exercise prescription for resistance, speed, and METs.;Short  Term: Perform resistance training exercises routinely during rehab and add in resistance training at home;Long Term: Improve cardiorespiratory fitness, muscular endurance and strength as measured by increased METs and functional capacity ( )       Able to understand and use rate of perceived exertion (RPE) scale Yes       Intervention Provide education and explanation on how to use RPE scale       Expected Outcomes Short Term: Able to use RPE daily in rehab to express subjective intensity level;Long Term:  Able to use RPE to guide intensity level when exercising independently       Able to understand and use Dyspnea scale Yes       Intervention Provide education and explanation on how to use Dyspnea scale       Expected Outcomes Short Term: Able to use Dyspnea scale daily in rehab to express subjective sense of shortness of breath during exertion;Long Term: Able to use Dyspnea scale to guide intensity level when exercising independently       Knowledge and understanding of Target Heart Rate Range (THRR) Yes       Intervention Provide education and explanation of THRR including how the numbers were predicted and where they are located for reference       Expected Outcomes Short Term: Able to state/look up THRR;Long Term: Able to use THRR to govern intensity when exercising independently;Short Term: Able to use daily as guideline for intensity in rehab       Able to check pulse independently Yes       Intervention Provide education and demonstration on how to check pulse in carotid and radial arteries.;Review the importance of being able to check your own pulse for safety during independent exercise       Expected Outcomes Short Term: Able to explain why pulse checking is important during independent exercise;Long Term: Able to check pulse independently and accurately       Understanding of Exercise Prescription Yes       Intervention Provide education, explanation, and written materials on patient's  individual exercise prescription       Expected Outcomes Short Term: Able to explain program exercise prescription;Long Term: Able to explain home exercise prescription to exercise independently          Copy of goals given to participant.

## 2024-05-13 NOTE — Progress Notes (Cosign Needed Addendum)
 Cardiac Individual Treatment Plan  Patient Details  Name: Monica Spence MRN: [id number] Date of Birth: 29-Jun-1956 Referring Provider:   Flowsheet Row CARDIAC REHAB PHASE II ORIENTATION from 05/13/2024 in [facility] CARDIAC REHABILITATION  Referring Provider Shelda Bruckner MD    Initial Encounter Date:  Flowsheet Row CARDIAC REHAB PHASE II ORIENTATION from 05/13/2024 in [place] [facility] CARDIAC REHABILITATION  Date 05/13/24    Visit Diagnosis: Status post coronary artery stent placement  Patient's Home Medications on Admission:  Current Outpatient Medications:    aspirin  EC 81 MG tablet, Take 1 tablet (81 mg total) by mouth daily. Swallow whole., Disp: 90 tablet, Rfl: 3   atorvastatin  (LIPITOR) 80 MG tablet, Take 80 mg by mouth daily., Disp: , Rfl:    nitroGLYCERIN  (NITROSTAT ) 0.4 MG SL tablet, Place 0.4 mg under the tongue every 5 (five) minutes as needed., Disp: , Rfl:    ticagrelor  (BRILINTA ) 90 MG TABS tablet, Take 1 tablet (90 mg total) by mouth 2 (two) times daily., Disp: 60 tablet, Rfl: 11   VENTOLIN HFA 108 (90 Base) MCG/ACT inhaler, Take 1-2 puffs by mouth daily. Inhale 1-2 puffs by mouth every 4 to 6 hours as needed for shortness of breath or wheezing, Disp: , Rfl:   Past Medical History: Past Medical History:  Diagnosis Date   COPD (chronic obstructive pulmonary disease) (HCC)    PONV (postoperative nausea and vomiting)     Tobacco Use: Social History   Tobacco Use  Smoking Status Former  Smokeless Tobacco Never    Labs: Review Flowsheet       Latest Ref Rng & Units 04/21/2024 04/23/2024 04/24/2024  Labs for ITP Cardiac and Pulmonary Rehab  Cholestrol 0 - 200 mg/dL - - 791   LDL (calc) 0 - 99 mg/dL - - 867   HDL-C >59 mg/dL - - 52   Trlycerides <849 mg/dL - - 880   Hemoglobin J8r 4.8 - 5.6 % - 4.8  -  TCO2 22 - 32 mmol/L 25  - -     Exercise Target Goals: Exercise Program Goal: Individual exercise prescription set using results from initial 6 min walk  test and THRR while considering  patient's activity barriers and safety.   Exercise Prescription Goal: Initial exercise prescription builds to 30-45 minutes a day of aerobic activity, 2-3 days per week.  Home exercise guidelines will be given to patient during program as part of exercise prescription that the participant will acknowledge.   Education: Aerobic Exercise: - Group verbal and visual presentation on the components of exercise prescription. Introduces F.I.T.T principle from ACSM for exercise prescriptions.  Reviews F.I.T.T. principles of aerobic exercise including progression. Written material provided at class time.   Education: Resistance Exercise: - Group verbal and visual presentation on the components of exercise prescription. Introduces F.I.T.T principle from ACSM for exercise prescriptions  Reviews F.I.T.T. principles of resistance exercise including progression. Written material provided at class time.    Education: Exercise & Equipment Safety: - Individual verbal instruction and demonstration of equipment use and safety with use of the equipment.   Education: Exercise Physiology & General Exercise Guidelines: - Group verbal and written instruction with models to review the exercise physiology of the cardiovascular system and associated critical values. Provides general exercise guidelines with specific guidelines to those with heart or lung disease. Written material provided at class time.   Education: Flexibility, Balance, Mind/Body Relaxation: - Group verbal and visual presentation with interactive activity on the components of exercise prescription. Introduces F.I.T.T principle  from ACSM for exercise prescriptions. Reviews F.I.T.T. principles of flexibility and balance exercise training including progression. Also discusses the mind body connection.  Reviews various relaxation techniques to help reduce and manage stress (i.e. Deep breathing, progressive muscle relaxation,  and visualization). Balance handout provided to take home. Written material provided at class time.   Activity Barriers & Risk Stratification:  Activity Barriers & Cardiac Risk Stratification - 05/08/24 1042       Activity Barriers & Cardiac Risk Stratification   Activity Barriers Back Problems;History of Falls   Burning sensation in chest occasionally.   Cardiac Risk Stratification Moderate          6 Minute Walk:  6 Minute Walk     Row Name 05/13/24 1431         6 Minute Walk   Phase Initial     Distance 1200 feet     Walk Time 6 minutes     # of Rest Breaks 0     MPH 2.27     METS 3.02     RPE 11     Perceived Dyspnea  0     VO2 Peak 10.6     Symptoms No     Resting HR 72 bpm     Resting BP 120/70     Resting Oxygen Saturation  96 %     Exercise Oxygen Saturation  during 6 min walk 96 %     Max Ex. HR 86 bpm     Max Ex. BP 136/70     2 Minute Post BP 122/60        Oxygen Initial Assessment:   Oxygen Re-Evaluation:   Oxygen Discharge (Final Oxygen Re-Evaluation):   Initial Exercise Prescription:  Initial Exercise Prescription - 05/13/24 1400       Date of Initial Exercise RX and Referring Provider   Date 05/13/24    Referring Provider Shelda Bruckner MD      Treadmill   MPH 1.2    Grade 0    Minutes 15    METs 1.92      REL-XR   Level 1    Speed 50    Minutes 15    METs 1.9      Prescription Details   Frequency (times per week) 3    Duration Progress to 30 minutes of continuous aerobic without signs/symptoms of physical distress      Intensity   THRR 40-80% of Max Heartrate 104-136    Ratings of Perceived Exertion 11-13    Perceived Dyspnea 0-4      Resistance Training   Training Prescription Yes    Weight 3    Reps 10-15          Perform Capillary Blood Glucose checks as needed.  Exercise Prescription Changes:   Exercise Prescription Changes     Row Name 05/13/24 1400             Response to Exercise    Blood Pressure (Admit) 120/70       Blood Pressure (Exercise) 136/70       Blood Pressure (Exit) 122/60       Heart Rate (Admit) 72 bpm       Heart Rate (Exercise) 86 bpm       Heart Rate (Exit) 73 bpm       Oxygen Saturation (Admit) 96 %       Oxygen Saturation (Exercise) 96 %       Oxygen Saturation (Exit) 96 %  Rating of Perceived Exertion (Exercise) 11       Perceived Dyspnea (Exercise) 0          Exercise Comments:   Exercise Goals and Review:   Exercise Goals     Row Name 05/13/24 1502             Exercise Goals   Increase Physical Activity Yes       Intervention Provide advice, education, support and counseling about physical activity/exercise needs.;Develop an individualized exercise prescription for aerobic and resistive training based on initial evaluation findings, risk stratification, comorbidities and participant's personal goals.       Expected Outcomes Short Term: Attend rehab on a regular basis to increase amount of physical activity.;Long Term: Add in home exercise to make exercise part of routine and to increase amount of physical activity.;Long Term: Exercising regularly at least 3-5 days a week.       Increase Strength and Stamina Yes       Intervention Provide advice, education, support and counseling about physical activity/exercise needs.;Develop an individualized exercise prescription for aerobic and resistive training based on initial evaluation findings, risk stratification, comorbidities and participant's personal goals.       Expected Outcomes Short Term: Increase workloads from initial exercise prescription for resistance, speed, and METs.;Short Term: Perform resistance training exercises routinely during rehab and add in resistance training at home;Long Term: Improve cardiorespiratory fitness, muscular endurance and strength as measured by increased METs and functional capacity ( )       Able to understand and use rate of perceived exertion  (RPE) scale Yes       Intervention Provide education and explanation on how to use RPE scale       Expected Outcomes Short Term: Able to use RPE daily in rehab to express subjective intensity level;Long Term:  Able to use RPE to guide intensity level when exercising independently       Able to understand and use Dyspnea scale Yes       Intervention Provide education and explanation on how to use Dyspnea scale       Expected Outcomes Short Term: Able to use Dyspnea scale daily in rehab to express subjective sense of shortness of breath during exertion;Long Term: Able to use Dyspnea scale to guide intensity level when exercising independently       Knowledge and understanding of Target Heart Rate Range (THRR) Yes       Intervention Provide education and explanation of THRR including how the numbers were predicted and where they are located for reference       Expected Outcomes Short Term: Able to state/look up THRR;Long Term: Able to use THRR to govern intensity when exercising independently;Short Term: Able to use daily as guideline for intensity in rehab       Able to check pulse independently Yes       Intervention Provide education and demonstration on how to check pulse in carotid and radial arteries.;Review the importance of being able to check your own pulse for safety during independent exercise       Expected Outcomes Short Term: Able to explain why pulse checking is important during independent exercise;Long Term: Able to check pulse independently and accurately       Understanding of Exercise Prescription Yes       Intervention Provide education, explanation, and written materials on patient's individual exercise prescription       Expected Outcomes Short Term: Able to explain program exercise prescription;Long Term: Able  to explain home exercise prescription to exercise independently          Exercise Goals Re-Evaluation :   Discharge Exercise Prescription (Final Exercise Prescription  Changes):  Exercise Prescription Changes - 05/13/24 1400       Response to Exercise   Blood Pressure (Admit) 120/70    Blood Pressure (Exercise) 136/70    Blood Pressure (Exit) 122/60    Heart Rate (Admit) 72 bpm    Heart Rate (Exercise) 86 bpm    Heart Rate (Exit) 73 bpm    Oxygen Saturation (Admit) 96 %    Oxygen Saturation (Exercise) 96 %    Oxygen Saturation (Exit) 96 %    Rating of Perceived Exertion (Exercise) 11    Perceived Dyspnea (Exercise) 0          Nutrition:  Target Goals: Understanding of nutrition guidelines, daily intake of sodium 1500mg , cholesterol 200mg , calories 30% from fat and 7% or less from saturated fats, daily to have 5 or more servings of fruits and vegetables.  Education: Nutrition 1 -Group instruction provided by verbal, written material, interactive activities, discussions, models, and posters to present general guidelines for heart healthy nutrition including macronutrients, label reading, and promoting whole foods over processed counterparts. Education serves as Pensions consultant of discussion of heart healthy eating for all. Written material provided at class time.    Education: Nutrition 2 -Group instruction provided by verbal, written material, interactive activities, discussions, models, and posters to present general guidelines for heart healthy nutrition including sodium, cholesterol, and saturated fat. Providing guidance of habit forming to improve blood pressure, cholesterol, and body weight. Written material provided at class time.     Biometrics:  Pre Biometrics - 05/13/24 1444       Pre Biometrics   Height 5' 3 (1.6 m)    Weight 51.8 kg    Waist Circumference 28 inches    Hip Circumference 35 inches    Waist to Hip Ratio 0.8 %    BMI (Calculated) 20.23    Grip Strength 20.7 kg    Single Leg Stand 9.29 seconds           Nutrition Therapy Plan and Nutrition Goals:   Nutrition Assessments:  MEDIFICTS Score Key: >=70 Need  to make dietary changes  40-70 Heart Healthy Diet <= 40 Therapeutic Level Cholesterol Diet  Flowsheet Row CARDIAC VIRTUAL BASED CARE from 05/08/2024 in [facility] CARDIAC REHABILITATION  Picture Your Plate Total Score on Admission 52   Picture Your Plate Scores: <59 Unhealthy dietary pattern with much room for improvement. 41-50 Dietary pattern unlikely to meet recommendations for good health and room for improvement. 51-60 More healthful dietary pattern, with some room for improvement.  >60 Healthy dietary pattern, although there may be some specific behaviors that could be improved.    Nutrition Goals Re-Evaluation:   Nutrition Goals Discharge (Final Nutrition Goals Re-Evaluation):   Psychosocial: Target Goals: Acknowledge presence or absence of significant depression and/or stress, maximize coping skills, provide positive support system. Participant is able to verbalize types and ability to use techniques and skills needed for reducing stress and depression.   Education: Stress, Anxiety, and Depression - Group verbal and visual presentation to define topics covered.  Reviews how body is impacted by stress, anxiety, and depression.  Also discusses healthy ways to reduce stress and to treat/manage anxiety and depression. Written material provided at class time.   Education: Sleep Hygiene -Provides group verbal and written instruction about how sleep can affect your health.  Define sleep hygiene, discuss sleep cycles and impact of sleep habits. Review good sleep hygiene tips.   Initial Review & Psychosocial Screening:  Initial Psych Review & Screening - 05/08/24 1059       Initial Review   Current issues with None Identified      Family Dynamics   Good Support System? Yes    Comments Patient son, daugher and boy friend support her.      Barriers   Psychosocial barriers to participate in program The patient should benefit from training in stress management and  relaxation.;There are no identifiable barriers or psychosocial needs.      Screening Interventions   Interventions To provide support and resources with identified psychosocial needs;Encouraged to exercise;Provide feedback about the scores to participant    Expected Outcomes Short Term goal: Utilizing psychosocial counselor, staff and physician to assist with identification of specific Stressors or current issues interfering with healing process. Setting desired goal for each stressor or current issue identified.;Long Term Goal: Stressors or current issues are controlled or eliminated.;Short Term goal: Identification and review with participant of any Quality of Life or Depression concerns found by scoring the questionnaire.;Long Term goal: The participant improves quality of Life and PHQ9 Scores as seen by post scores and/or verbalization of changes          Quality of Life Scores:   Quality of Life - 05/08/24 1058       Quality of Life   Select Quality of Life      Quality of Life Scores   Health/Function Pre 22.73 %    Socioeconomic Pre 25.94 %    Psych/Spiritual Pre 25.36 %    Family Pre 30 %    GLOBAL Pre 25.03 %         Scores of 19 and below usually indicate a poorer quality of life in these areas.  A difference of  2-3 points is a clinically meaningful difference.  A difference of 2-3 points in the total score of the Quality of Life Index has been associated with significant improvement in overall quality of life, self-image, physical symptoms, and general health in studies assessing change in quality of life.  PHQ-9: Review Flowsheet       05/13/2024  Depression screen PHQ 2/9  Decreased Interest 0  Down, Depressed, Hopeless 0  PHQ - 2 Score 0  Altered sleeping 0  Tired, decreased energy 2  Change in appetite 2  Feeling bad or failure about yourself  0  Trouble concentrating 0  Moving slowly or fidgety/restless 0  Suicidal thoughts 0  PHQ-9 Score 4  Difficult  doing work/chores Not difficult at all   Interpretation of Total Score  Total Score Depression Severity:  1-4 = Minimal depression, 5-9 = Mild depression, 10-14 = Moderate depression, 15-19 = Moderately severe depression, 20-27 = Severe depression   Psychosocial Evaluation and Intervention:  Psychosocial Evaluation - 05/08/24 1059       Psychosocial Evaluation & Interventions   Interventions Relaxation education;Encouraged to exercise with the program and follow exercise prescription;Stress management education    Comments Patient was referred to cardiac rehab with stent placement 04/23/24. She denies any depression, anxiety, or stressors currently other than having the stent procedure. She has a significant family history of heart disease and wants to learn how to change her lifestyle to be as healthy as she can.  She says she sleeps well at night. She has a $40 co-payment but says this will not be a  barrier for her to participate in the program. Her goals are as stated above and to learn what she is able to do. She has no barriers identified to complete the program.    Expected Outcomes Short Term: Patient will start the program and attend consistently. Long Term: Patient will complete the program meeting personal goals.    Continue Psychosocial Services  Follow up required by staff          Psychosocial Re-Evaluation:   Psychosocial Discharge (Final Psychosocial Re-Evaluation):   Vocational Rehabilitation: Provide vocational rehab assistance to qualifying candidates.   Vocational Rehab Evaluation & Intervention:  Vocational Rehab - 05/08/24 1058       Initial Vocational Rehab Evaluation & Intervention   Assessment shows need for Vocational Rehabilitation No      Vocational Rehab Re-Evaulation   Comments Patient is retired.          Education: Education Goals: Education classes will be provided on a variety of topics geared toward better understanding of heart health and  risk factor modification. Participant will state understanding/return demonstration of topics presented as noted by education test scores.  Learning Barriers/Preferences:  Learning Barriers/Preferences - 05/08/24 1056       Learning Barriers/Preferences   Learning Barriers None    Learning Preferences Skilled Demonstration          General Cardiac Education Topics:  AED/CPR: - Group verbal and written instruction with the use of models to demonstrate the basic use of the AED with the basic ABC's of resuscitation.   Test and Procedures: - Group verbal and visual presentation and models provide information about basic cardiac anatomy and function. Reviews the testing methods done to diagnose heart disease and the outcomes of the test results. Describes the treatment choices: Medical Management, Angioplasty, or Coronary Bypass Surgery for treating various heart conditions including Myocardial Infarction, Angina, Valve Disease, and Cardiac Arrhythmias. Written material provided at class time.   Medication Safety: - Group verbal and visual instruction to review commonly prescribed medications for heart and lung disease. Reviews the medication, class of the drug, and side effects. Includes the steps to properly store meds and maintain the prescription regimen. Written material provided at class time.   Intimacy: - Group verbal instruction through game format to discuss how heart and lung disease can affect sexual intimacy. Written material provided at class time.   Know Your Numbers and Heart Failure: - Group verbal and visual instruction to discuss disease risk factors for cardiac and pulmonary disease and treatment options.  Reviews associated critical values for Overweight/Obesity, Hypertension, Cholesterol, and Diabetes.  Discusses basics of heart failure: signs/symptoms and treatments.  Introduces Heart Failure Zone chart for action plan for heart failure. Written material provided at  class time.   Infection Prevention: - Provides verbal and written material to individual with discussion of infection control including proper hand washing and proper equipment cleaning during exercise session.   Falls Prevention: - Provides verbal and written material to individual with discussion of falls prevention and safety.   Other: -Provides group and verbal instruction on various topics (see comments)   Knowledge Questionnaire Score:  Knowledge Questionnaire Score - 05/08/24 1056       Knowledge Questionnaire Score   Pre Score 25/26          Core Components/Risk Factors/Patient Goals at Admission:  Personal Goals and Risk Factors at Admission - 05/08/24 1058       Core Components/Risk Factors/Patient Goals on Admission    Weight  Management Weight Maintenance    Lipids Yes    Intervention Provide education and support for participant on nutrition & aerobic/resistive exercise along with prescribed medications to achieve LDL 70mg , HDL >40mg .    Expected Outcomes Short Term: Participant states understanding of desired cholesterol values and is compliant with medications prescribed. Participant is following exercise prescription and nutrition guidelines.;Long Term: Cholesterol controlled with medications as prescribed, with individualized exercise RX and with personalized nutrition plan. Value goals: LDL < 70mg , HDL > 40 mg.          Education:Diabetes - Individual verbal and written instruction to review signs/symptoms of diabetes, desired ranges of glucose level fasting, after meals and with exercise. Acknowledge that pre and post exercise glucose checks will be done for 3 sessions at entry of program.   Core Components/Risk Factors/Patient Goals Review:    Core Components/Risk Factors/Patient Goals at Discharge (Final Review):    ITP Comments:  ITP Comments     Row Name 05/08/24 1108 05/13/24 1423         ITP Comments Virtual orientation visit completed  for cardiac rehab with stent placement. On-site orientation visit scheduled for 05/13/24. Patient arrived for 1st visit/orientation/education at 1300. Patient was referred to CR by Dr. Soyla Merck attending/Referred by Dr. Shelda Bruckner due to Coronary artery Stent placement. During orientation advised patient on arrival and appointment times what to wear, what to do before, during and after exercise. Reviewed attendance and class policy.  Pt is scheduled to return Cardiac Rehab on 05/18/24 at 1100. Pt was advised to come to class 15 minutes before class starts.  Discussed RPE/Dpysnea scales. Patient participated in warm up stretches. Patient was able to complete 6 minute walk test.  Telemetry:NSR. Patient was measured for the equipment. Discussed equipment safety with patient. Took patient pre-anthropometric measurements. Patient finished visit at 1423.         Comments: Patient arrived for 1st visit/orientation/education at 1300. Patient was referred to CR by Dr. Soyla Merck attending/Referred by Dr. Shelda Bruckner due to Coronary artery Stent placement. During orientation advised patient on arrival and appointment times what to wear, what to do before, during and after exercise. Reviewed attendance and class policy.  Pt is scheduled to return Cardiac Rehab on 05/18/24 at 1100. Pt was advised to come to class 15 minutes before class starts.  Discussed RPE/Dpysnea scales. Patient participated in warm up stretches. Patient was able to complete 6 minute walk test.  Telemetry:NSR. Patient was measured for the equipment. Discussed equipment safety with patient. Took patient pre-anthropometric measurements. Patient finished visit at 1423.

## 2024-05-18 ENCOUNTER — Encounter (HOSPITAL_COMMUNITY)
Admission: RE | Admit: 2024-05-18 | Discharge: 2024-05-18 | Disposition: A | Source: Ambulatory Visit | Attending: Internal Medicine

## 2024-05-18 DIAGNOSIS — Z955 Presence of coronary angioplasty implant and graft: Secondary | ICD-10-CM | POA: Diagnosis not present

## 2024-05-18 NOTE — Progress Notes (Signed)
 Daily Session Note  Patient Details  Name: Monica Spence MRN: [id number] Date of Birth: [birth year] Referring Provider:   Flowsheet Row CARDIAC REHAB PHASE II ORIENTATION from 05/13/2024 in [facility] CARDIAC REHABILITATION  Referring Provider Shelda Bruckner MD    Encounter Date: 05/18/2024  Check In:  Session Check In - 05/18/24 1105       Check-In   Supervising physician immediately available to respond to emergencies See telemetry face sheet for immediately available MD    Location [facility]    Staff Present Laymon Rattler, BSN, RN, WTA-C;Keelan Pomerleau Zina, RN;Jessica Palma Sola, MA, RCEP, CCRP, CCET    Virtual Visit No    Medication changes reported     No    Fall or balance concerns reported    No    Warm-up and Cool-down Performed on first and last piece of equipment    Resistance Training Performed Yes    VAD Patient? No    PAD/SET Patient? No      Pain Assessment   Currently in Pain? No/denies          Capillary Blood Glucose: No results found for this or any previous visit (from the past 24 hours).    Social History   Tobacco Use  Smoking Status Former  Smokeless Tobacco Never    Goals Met:  Independence with exercise equipment Exercise tolerated well No report of concerns or symptoms today Strength training completed today  Goals Unmet:  Not Applicable  Comments: First full day of exercise!  Patient was oriented to gym and equipment including functions, settings, policies, and procedures.  Patient's individual exercise prescription and treatment plan were reviewed.  All starting workloads were established based on the results of the 6 minute walk test done at initial orientation visit.  The plan for exercise progression was also introduced and progression will be customized based on patient's performance and goals.

## 2024-05-20 ENCOUNTER — Encounter (HOSPITAL_COMMUNITY)
Admission: RE | Admit: 2024-05-20 | Discharge: 2024-05-20 | Disposition: A | Discharge status: Home / Self Care | Source: Ambulatory Visit | Attending: Internal Medicine | Admitting: Internal Medicine

## 2024-05-20 ENCOUNTER — Encounter (HOSPITAL_COMMUNITY): Payer: Self-pay | Admitting: *Deleted

## 2024-05-20 DIAGNOSIS — Z955 Presence of coronary angioplasty implant and graft: Secondary | ICD-10-CM

## 2024-05-20 NOTE — Progress Notes (Signed)
 Daily Session Note  Patient Details  Name: Calena Salem MRN: [id number] Date of Birth: 12-Apr-1956 Referring Provider:   Flowsheet Row CARDIAC REHAB PHASE II ORIENTATION from 05/13/2024 in [facility] CARDIAC REHABILITATION  Referring Provider Shelda Bruckner MD    Encounter Date: 05/20/2024  Check In:  Session Check In - 05/20/24 1037       Check-In   Supervising physician immediately available to respond to emergencies See telemetry face sheet for immediately available MD    Location [facility]    Staff Present Laymon Rattler, BSN, RN, Rosalba Gelineau, MA, RCEP, CCRP, CCET;Heather [place], [place], Exercise Physiologist    Virtual Visit No    Medication changes reported     No    Fall or balance concerns reported    No    Tobacco Cessation No Change    Warm-up and Cool-down Performed on first and last piece of equipment    Resistance Training Performed Yes    VAD Patient? No    PAD/SET Patient? No      Pain Assessment   Currently in Pain? No/denies          Capillary Blood Glucose: No results found for this or any previous visit (from the past 24 hours).    Social History   Tobacco Use  Smoking Status Former  Smokeless Tobacco Never    Goals Met:  Independence with exercise equipment Exercise tolerated well No report of concerns or symptoms today Strength training completed today  Goals Unmet:  Not Applicable  Comments: Pt able to follow exercise prescription today without complaint.  Will continue to monitor for progression.

## 2024-05-20 NOTE — Progress Notes (Signed)
 Cardiac Individual Treatment Plan  Patient Details  Name: Monica Spence MRN: [id number] Date of Birth: 04/03/1956 Referring Provider:   Flowsheet Row CARDIAC REHAB PHASE II ORIENTATION from 05/13/2024 in [facility] CARDIAC REHABILITATION  Referring Provider Shelda Bruckner MD    Initial Encounter Date:  Flowsheet Row CARDIAC REHAB PHASE II ORIENTATION from 05/13/2024 in [facility] [place] CARDIAC REHABILITATION  Date 05/13/24    Visit Diagnosis: Status post coronary artery stent placement  Patient's Home Medications on Admission:  Current Outpatient Medications:    aspirin  EC 81 MG tablet, Take 1 tablet (81 mg total) by mouth daily. Swallow whole., Disp: 90 tablet, Rfl: 3   atorvastatin  (LIPITOR) 80 MG tablet, Take 80 mg by mouth daily., Disp: , Rfl:    nitroGLYCERIN  (NITROSTAT ) 0.4 MG SL tablet, Place 0.4 mg under the tongue every 5 (five) minutes as needed., Disp: , Rfl:    ticagrelor  (BRILINTA ) 90 MG TABS tablet, Take 1 tablet (90 mg total) by mouth 2 (two) times daily., Disp: 60 tablet, Rfl: 11   VENTOLIN HFA 108 (90 Base) MCG/ACT inhaler, Take 1-2 puffs by mouth daily. Inhale 1-2 puffs by mouth every 4 to 6 hours as needed for shortness of breath or wheezing, Disp: , Rfl:   Past Medical History: Past Medical History:  Diagnosis Date   COPD (chronic obstructive pulmonary disease) (HCC)    PONV (postoperative nausea and vomiting)     Tobacco Use: Social History   Tobacco Use  Smoking Status Former  Smokeless Tobacco Never    Labs: Review Flowsheet       Latest Ref Rng & Units 04/21/2024 04/23/2024 04/24/2024  Labs for ITP Cardiac and Pulmonary Rehab  Cholestrol 0 - 200 mg/dL - - 791   LDL (calc) 0 - 99 mg/dL - - 867   HDL-C >59 mg/dL - - 52   Trlycerides <849 mg/dL - - 880   Hemoglobin J8r 4.8 - 5.6 % - 4.8  -  TCO2 22 - 32 mmol/L 25  - -    Capillary Blood Glucose: No results found for: GLUCAP   Exercise Target Goals: Exercise Program Goal: Individual  exercise prescription set using results from initial 6 min walk test and THRR while considering  patient's activity barriers and safety.   Exercise Prescription Goal: Starting with aerobic activity 30 plus minutes a day, 3 days per week for initial exercise prescription. Provide home exercise prescription and guidelines that participant acknowledges understanding prior to discharge.  Activity Barriers & Risk Stratification:  Activity Barriers & Cardiac Risk Stratification - 05/08/24 1042       Activity Barriers & Cardiac Risk Stratification   Activity Barriers Back Problems;History of Falls   Burning sensation in chest occasionally.   Cardiac Risk Stratification Moderate          6 Minute Walk:  6 Minute Walk     Row Name 05/13/24 1431         6 Minute Walk   Phase Initial     Distance 1200 feet     Walk Time 6 minutes     # of Rest Breaks 0     MPH 2.27     METS 3.02     RPE 11     Perceived Dyspnea  0     VO2 Peak 10.6     Symptoms No     Resting HR 72 bpm     Resting BP 120/70     Resting Oxygen Saturation  96 %  Exercise Oxygen Saturation  during 6 min walk 96 %     Max Ex. HR 86 bpm     Max Ex. BP 136/70     2 Minute Post BP 122/60        Oxygen Initial Assessment:   Oxygen Re-Evaluation:   Oxygen Discharge (Final Oxygen Re-Evaluation):   Initial Exercise Prescription:  Initial Exercise Prescription - 05/13/24 1400       Date of Initial Exercise RX and Referring Provider   Date 05/13/24    Referring Provider Shelda Bruckner MD      Treadmill   MPH 1.2    Grade 0    Minutes 15    METs 1.92      REL-XR   Level 1    Speed 50    Minutes 15    METs 1.9      Prescription Details   Frequency (times per week) 3    Duration Progress to 30 minutes of continuous aerobic without signs/symptoms of physical distress      Intensity   THRR 40-80% of Max Heartrate 104-136    Ratings of Perceived Exertion 11-13    Perceived Dyspnea 0-4       Resistance Training   Training Prescription Yes    Weight 3    Reps 10-15          Perform Capillary Blood Glucose checks as needed.  Exercise Prescription Changes:   Exercise Prescription Changes     Row Name 05/13/24 1400             Response to Exercise   Blood Pressure (Admit) 120/70       Blood Pressure (Exercise) 136/70       Blood Pressure (Exit) 122/60       Heart Rate (Admit) 72 bpm       Heart Rate (Exercise) 86 bpm       Heart Rate (Exit) 73 bpm       Oxygen Saturation (Admit) 96 %       Oxygen Saturation (Exercise) 96 %       Oxygen Saturation (Exit) 96 %       Rating of Perceived Exertion (Exercise) 11       Perceived Dyspnea (Exercise) 0          Exercise Comments:   Exercise Comments     Row Name 05/18/24 1107           Exercise Comments First full day of exercise!  Patient was oriented to gym and equipment including functions, settings, policies, and procedures.  Patient's individual exercise prescription and treatment plan were reviewed.  All starting workloads were established based on the results of the 6 minute walk test done at initial orientation visit.  The plan for exercise progression was also introduced and progression will be customized based on patient's performance and goals.          Exercise Goals and Review:   Exercise Goals     Row Name 05/13/24 1502             Exercise Goals   Increase Physical Activity Yes       Intervention Provide advice, education, support and counseling about physical activity/exercise needs.;Develop an individualized exercise prescription for aerobic and resistive training based on initial evaluation findings, risk stratification, comorbidities and participant's personal goals.       Expected Outcomes Short Term: Attend rehab on a regular basis to increase amount of physical activity.;Long Term: Add  in home exercise to make exercise part of routine and to increase amount of physical  activity.;Long Term: Exercising regularly at least 3-5 days a week.       Increase Strength and Stamina Yes       Intervention Provide advice, education, support and counseling about physical activity/exercise needs.;Develop an individualized exercise prescription for aerobic and resistive training based on initial evaluation findings, risk stratification, comorbidities and participant's personal goals.       Expected Outcomes Short Term: Increase workloads from initial exercise prescription for resistance, speed, and METs.;Short Term: Perform resistance training exercises routinely during rehab and add in resistance training at home;Long Term: Improve cardiorespiratory fitness, muscular endurance and strength as measured by increased METs and functional capacity ( )       Able to understand and use rate of perceived exertion (RPE) scale Yes       Intervention Provide education and explanation on how to use RPE scale       Expected Outcomes Short Term: Able to use RPE daily in rehab to express subjective intensity level;Long Term:  Able to use RPE to guide intensity level when exercising independently       Able to understand and use Dyspnea scale Yes       Intervention Provide education and explanation on how to use Dyspnea scale       Expected Outcomes Short Term: Able to use Dyspnea scale daily in rehab to express subjective sense of shortness of breath during exertion;Long Term: Able to use Dyspnea scale to guide intensity level when exercising independently       Knowledge and understanding of Target Heart Rate Range (THRR) Yes       Intervention Provide education and explanation of THRR including how the numbers were predicted and where they are located for reference       Expected Outcomes Short Term: Able to state/look up THRR;Long Term: Able to use THRR to govern intensity when exercising independently;Short Term: Able to use daily as guideline for intensity in rehab       Able to check  pulse independently Yes       Intervention Provide education and demonstration on how to check pulse in carotid and radial arteries.;Review the importance of being able to check your own pulse for safety during independent exercise       Expected Outcomes Short Term: Able to explain why pulse checking is important during independent exercise;Long Term: Able to check pulse independently and accurately       Understanding of Exercise Prescription Yes       Intervention Provide education, explanation, and written materials on patient's individual exercise prescription       Expected Outcomes Short Term: Able to explain program exercise prescription;Long Term: Able to explain home exercise prescription to exercise independently          Exercise Goals Re-Evaluation :    Discharge Exercise Prescription (Final Exercise Prescription Changes):  Exercise Prescription Changes - 05/13/24 1400       Response to Exercise   Blood Pressure (Admit) 120/70    Blood Pressure (Exercise) 136/70    Blood Pressure (Exit) 122/60    Heart Rate (Admit) 72 bpm    Heart Rate (Exercise) 86 bpm    Heart Rate (Exit) 73 bpm    Oxygen Saturation (Admit) 96 %    Oxygen Saturation (Exercise) 96 %    Oxygen Saturation (Exit) 96 %    Rating of Perceived Exertion (Exercise) 11  Perceived Dyspnea (Exercise) 0          Nutrition:  Target Goals: Understanding of nutrition guidelines, daily intake of sodium 1500mg , cholesterol 200mg , calories 30% from fat and 7% or less from saturated fats, daily to have 5 or more servings of fruits and vegetables.  Biometrics:  Pre Biometrics - 05/13/24 1444       Pre Biometrics   Height 5' 3 (1.6 m)    Weight 114 lb 3.2 oz (51.8 kg)    Waist Circumference 28 inches    Hip Circumference 35 inches    Waist to Hip Ratio 0.8 %    BMI (Calculated) 20.23    Grip Strength 20.7 kg    Single Leg Stand 9.29 seconds           Nutrition Therapy Plan and Nutrition  Goals:   Nutrition Assessments:  MEDIFICTS Score Key: >=70 Need to make dietary changes  40-70 Heart Healthy Diet <= 40 Therapeutic Level Cholesterol Diet  Flowsheet Row CARDIAC VIRTUAL BASED CARE from 05/08/2024 in [facility] CARDIAC REHABILITATION  Picture Your Plate Total Score on Admission 52   Picture Your Plate Scores: <59 Unhealthy dietary pattern with much room for improvement. 41-50 Dietary pattern unlikely to meet recommendations for good health and room for improvement. 51-60 More healthful dietary pattern, with some room for improvement.  >60 Healthy dietary pattern, although there may be some specific behaviors that could be improved.    Nutrition Goals Re-Evaluation:   Nutrition Goals Discharge (Final Nutrition Goals Re-Evaluation):   Psychosocial: Target Goals: Acknowledge presence or absence of significant depression and/or stress, maximize coping skills, provide positive support system. Participant is able to verbalize types and ability to use techniques and skills needed for reducing stress and depression.  Initial Review & Psychosocial Screening:  Initial Psych Review & Screening - 05/08/24 1059       Initial Review   Current issues with None Identified      Family Dynamics   Good Support System? Yes    Comments Patient son, daugher and boy friend support her.      Barriers   Psychosocial barriers to participate in program The patient should benefit from training in stress management and relaxation.;There are no identifiable barriers or psychosocial needs.      Screening Interventions   Interventions To provide support and resources with identified psychosocial needs;Encouraged to exercise;Provide feedback about the scores to participant    Expected Outcomes Short Term goal: Utilizing psychosocial counselor, staff and physician to assist with identification of specific Stressors or current issues interfering with healing process. Setting desired goal for  each stressor or current issue identified.;Long Term Goal: Stressors or current issues are controlled or eliminated.;Short Term goal: Identification and review with participant of any Quality of Life or Depression concerns found by scoring the questionnaire.;Long Term goal: The participant improves quality of Life and PHQ9 Scores as seen by post scores and/or verbalization of changes          Quality of Life Scores:  Quality of Life - 05/08/24 1058       Quality of Life   Select Quality of Life      Quality of Life Scores   Health/Function Pre 22.73 %    Socioeconomic Pre 25.94 %    Psych/Spiritual Pre 25.36 %    Family Pre 30 %    GLOBAL Pre 25.03 %         Scores of 19 and below usually indicate a poorer  quality of life in these areas.  A difference of  2-3 points is a clinically meaningful difference.  A difference of 2-3 points in the total score of the Quality of Life Index has been associated with significant improvement in overall quality of life, self-image, physical symptoms, and general health in studies assessing change in quality of life.  PHQ-9: Review Flowsheet       05/13/2024  Depression screen PHQ 2/9  Decreased Interest 0  Down, Depressed, Hopeless 0  PHQ - 2 Score 0  Altered sleeping 0  Tired, decreased energy 2  Change in appetite 2  Feeling bad or failure about yourself  0  Trouble concentrating 0  Moving slowly or fidgety/restless 0  Suicidal thoughts 0  PHQ-9 Score 4  Difficult doing work/chores Not difficult at all   Interpretation of Total Score  Total Score Depression Severity:  1-4 = Minimal depression, 5-9 = Mild depression, 10-14 = Moderate depression, 15-19 = Moderately severe depression, 20-27 = Severe depression   Psychosocial Evaluation and Intervention:  Psychosocial Evaluation - 05/08/24 1059       Psychosocial Evaluation & Interventions   Interventions Relaxation education;Encouraged to exercise with the program and follow  exercise prescription;Stress management education    Comments Patient was referred to cardiac rehab with stent placement 04/23/24. She denies any depression, anxiety, or stressors currently other than having the stent procedure. She has a significant family history of heart disease and wants to learn how to change her lifestyle to be as healthy as she can.  She says she sleeps well at night. She has a $40 co-payment but says this will not be a barrier for her to participate in the program. Her goals are as stated above and to learn what she is able to do. She has no barriers identified to complete the program.    Expected Outcomes Short Term: Patient will start the program and attend consistently. Long Term: Patient will complete the program meeting personal goals.    Continue Psychosocial Services  Follow up required by staff          Psychosocial Re-Evaluation:   Psychosocial Discharge (Final Psychosocial Re-Evaluation):   Vocational Rehabilitation: Provide vocational rehab assistance to qualifying candidates.   Vocational Rehab Evaluation & Intervention:  Vocational Rehab - 05/08/24 1058       Initial Vocational Rehab Evaluation & Intervention   Assessment shows need for Vocational Rehabilitation No      Vocational Rehab Re-Evaulation   Comments Patient is retired.          Education: Education Goals: Education classes will be provided on a weekly basis, covering required topics. Participant will state understanding/return demonstration of topics presented.  Learning Barriers/Preferences:  Learning Barriers/Preferences - 05/08/24 1056       Learning Barriers/Preferences   Learning Barriers None    Learning Preferences Skilled Demonstration          Education Topics: Hypertension, Hypertension Reduction -Define heart disease and high blood pressure. Discus how high blood pressure affects the body and ways to reduce high blood pressure.   Exercise and Your  Heart -Discuss why it is important to exercise, the FITT principles of exercise, normal and abnormal responses to exercise, and how to exercise safely.   Angina -Discuss definition of angina, causes of angina, treatment of angina, and how to decrease risk of having angina.   Cardiac Medications -Review what the following cardiac medications are used for, how they affect the body, and side effects that  may occur when taking the medications.  Medications include Aspirin , Beta blockers, calcium  channel blockers, ACE Inhibitors, angiotensin receptor blockers, diuretics, digoxin, and antihyperlipidemics.   Congestive Heart Failure -Discuss the definition of CHF, how to live with CHF, the signs and symptoms of CHF, and how keep track of weight and sodium intake.   Heart Disease and Intimacy -Discus the effect sexual activity has on the heart, how changes occur during intimacy as we age, and safety during sexual activity.   Smoking Cessation / COPD -Discuss different methods to quit smoking, the health benefits of quitting smoking, and the definition of COPD.   Nutrition I: Fats -Discuss the types of cholesterol, what cholesterol does to the heart, and how cholesterol levels can be controlled.   Nutrition II: Labels -Discuss the different components of food labels and how to read food label   Heart Parts/Heart Disease and PAD -Discuss the anatomy of the heart, the pathway of blood circulation through the heart, and these are affected by heart disease.   Stress I: Signs and Symptoms -Discuss the causes of stress, how stress may lead to anxiety and depression, and ways to limit stress.   Stress II: Relaxation -Discuss different types of relaxation techniques to limit stress.   Warning Signs of Stroke / TIA -Discuss definition of a stroke, what the signs and symptoms are of a stroke, and how to identify when someone is having stroke.   Knowledge Questionnaire Score:  Knowledge  Questionnaire Score - 05/08/24 1056       Knowledge Questionnaire Score   Pre Score 25/26          Core Components/Risk Factors/Patient Goals at Admission:  Personal Goals and Risk Factors at Admission - 05/08/24 1058       Core Components/Risk Factors/Patient Goals on Admission    Weight Management Weight Maintenance    Lipids Yes    Intervention Provide education and support for participant on nutrition & aerobic/resistive exercise along with prescribed medications to achieve LDL 70mg , HDL >40mg .    Expected Outcomes Short Term: Participant states understanding of desired cholesterol values and is compliant with medications prescribed. Participant is following exercise prescription and nutrition guidelines.;Long Term: Cholesterol controlled with medications as prescribed, with individualized exercise RX and with personalized nutrition plan. Value goals: LDL < 70mg , HDL > 40 mg.          Core Components/Risk Factors/Patient Goals Review:    Core Components/Risk Factors/Patient Goals at Discharge (Final Review):    ITP Comments:  ITP Comments     Row Name 05/08/24 1108 05/13/24 1423 05/18/24 1107 05/20/24 1333     ITP Comments Virtual orientation visit completed for cardiac rehab with stent placement. On-site orientation visit scheduled for 05/13/24. Patient arrived for 1st visit/orientation/education at 1300. Patient was referred to CR by Dr. Soyla Merck attending/Referred by Dr. Shelda Bruckner due to Coronary artery Stent placement. During orientation advised patient on arrival and appointment times what to wear, what to do before, during and after exercise. Reviewed attendance and class policy.  Pt is scheduled to return Cardiac Rehab on 05/18/24 at 1100. Pt was advised to come to class 15 minutes before class starts.  Discussed RPE/Dpysnea scales. Patient participated in warm up stretches. Patient was able to complete 6 minute walk test.  Telemetry:NSR. Patient was  measured for the equipment. Discussed equipment safety with patient. Took patient pre-anthropometric measurements. Patient finished visit at 1423. First full day of exercise!  Patient was oriented to gym and equipment including  functions, settings, policies, and procedures.  Patient's individual exercise prescription and treatment plan were reviewed.  All starting workloads were established based on the results of the 6 minute walk test done at initial orientation visit.  The plan for exercise progression was also introduced and progression will be customized based on patient's performance and goals. 30 day review completed. ITP sent to Dr. Dorn Ross, Medical Director of Cardiac Rehab. Continue with ITP unless changes are made by physician.       Comments: 30 day review

## 2024-05-22 ENCOUNTER — Encounter (HOSPITAL_COMMUNITY)
Admission: RE | Admit: 2024-05-22 | Discharge: 2024-05-22 | Disposition: A | Discharge status: Home / Self Care | Source: Ambulatory Visit | Attending: Internal Medicine | Admitting: Internal Medicine

## 2024-05-22 DIAGNOSIS — Z955 Presence of coronary angioplasty implant and graft: Secondary | ICD-10-CM

## 2024-05-22 NOTE — Progress Notes (Signed)
 Daily Session Note  Patient Details  Name: Tomika Eckles MRN: [id number] Date of Birth: 01/24/1956 Referring Provider:   Flowsheet Row CARDIAC REHAB PHASE II ORIENTATION from 05/13/2024 in [facility] CARDIAC REHABILITATION  Referring Provider Shelda Bruckner MD    Encounter Date: 05/22/2024  Check In:  Session Check In - 05/22/24 1045       Check-In   Supervising physician immediately available to respond to emergencies See telemetry face sheet for immediately available MD    Location [facility]    Staff Present Richerd Buddle, RN;Saphronia Ozdemir Vicci, RN, BSN;Heather Con, BS, Exercise Physiologist    Virtual Visit No    Medication changes reported     No    Fall or balance concerns reported    No    Warm-up and Cool-down Performed on first and last piece of equipment    Resistance Training Performed Yes    VAD Patient? No    PAD/SET Patient? No      Pain Assessment   Currently in Pain? No/denies    Pain Score 0-No pain    Multiple Pain Sites No          Capillary Blood Glucose: No results found for this or any previous visit (from the past 24 hours).    Social History   Tobacco Use  Smoking Status Former  Smokeless Tobacco Never    Goals Met:  Independence with exercise equipment Exercise tolerated well No report of concerns or symptoms today Strength training completed today  Goals Unmet:  Not Applicable  Comments: Pt able to follow exercise prescription today without complaint.  Will continue to monitor for progression.

## 2024-05-25 ENCOUNTER — Other Ambulatory Visit (HOSPITAL_COMMUNITY): Payer: Self-pay

## 2024-05-25 ENCOUNTER — Encounter (HOSPITAL_COMMUNITY)

## 2024-05-27 ENCOUNTER — Encounter (HOSPITAL_COMMUNITY)
Admission: RE | Admit: 2024-05-27 | Discharge: 2024-05-27 | Disposition: A | Source: Ambulatory Visit | Attending: Internal Medicine

## 2024-05-27 DIAGNOSIS — Z955 Presence of coronary angioplasty implant and graft: Secondary | ICD-10-CM | POA: Diagnosis not present

## 2024-05-27 NOTE — Progress Notes (Signed)
 Daily Session Note  Patient Details  Name: Monica Spence MRN: [id number] Date of Birth: Dec 18, 1955 Referring Provider:   Flowsheet Row CARDIAC REHAB PHASE II ORIENTATION from 05/13/2024 in [facility] CARDIAC REHABILITATION  Referring Provider Shelda Bruckner MD    Encounter Date: 05/27/2024  Check In:  Session Check In - 05/27/24 1051       Check-In   Supervising physician immediately available to respond to emergencies See telemetry face sheet for immediately available MD    Location [facility]    Staff Present Laymon Rattler, BSN, RN, WTA-C;Heather Con, BS, Exercise Physiologist;Jessica McClure, MA, RCEP, CCRP, Sueellen Louder, RN, Engineer, mining, RN    Virtual Visit No    Medication changes reported     No    Fall or balance concerns reported    No    Tobacco Cessation No Change    Warm-up and Cool-down Performed on first and last piece of equipment    Resistance Training Performed Yes    VAD Patient? No    PAD/SET Patient? No      Pain Assessment   Currently in Pain? No/denies    Pain Score 0-No pain    Multiple Pain Sites No          Capillary Blood Glucose: No results found for this or any previous visit (from the past 24 hours).    Social History   Tobacco Use  Smoking Status Former  Smokeless Tobacco Never    Goals Met:  Independence with exercise equipment Exercise tolerated well No report of concerns or symptoms today Strength training completed today  Goals Unmet:  Not Applicable  Comments: .Pt able to follow exercise prescription today without complaint.  Will continue to monitor for progression.

## 2024-05-29 ENCOUNTER — Encounter (HOSPITAL_COMMUNITY)

## 2024-06-01 ENCOUNTER — Encounter (HOSPITAL_COMMUNITY)

## 2024-06-02 ENCOUNTER — Ambulatory Visit (HOSPITAL_COMMUNITY)

## 2024-06-03 ENCOUNTER — Encounter (HOSPITAL_COMMUNITY)

## 2024-06-05 ENCOUNTER — Encounter (HOSPITAL_COMMUNITY)

## 2024-06-08 ENCOUNTER — Telehealth (HOSPITAL_COMMUNITY): Payer: Self-pay

## 2024-06-08 ENCOUNTER — Encounter (HOSPITAL_COMMUNITY)

## 2024-06-10 ENCOUNTER — Encounter (HOSPITAL_COMMUNITY)

## 2024-06-12 ENCOUNTER — Encounter (HOSPITAL_COMMUNITY)

## 2024-06-12 ENCOUNTER — Telehealth (HOSPITAL_COMMUNITY): Payer: Self-pay

## 2024-06-12 NOTE — Telephone Encounter (Signed)
 Called patient regarding missed sessions from cardiac rehab. Left VM that we were assuming she was not going to participate in the program and if this was not the case, to call. Contact information provided. Also mailing a letter.

## 2024-06-13 ENCOUNTER — Emergency Department (HOSPITAL_BASED_OUTPATIENT_CLINIC_OR_DEPARTMENT_OTHER)
Admission: EM | Admit: 2024-06-13 | Discharge: 2024-06-13 | Disposition: A | Discharge status: Home / Self Care | Attending: Emergency Medicine | Admitting: Emergency Medicine

## 2024-06-13 ENCOUNTER — Emergency Department (HOSPITAL_BASED_OUTPATIENT_CLINIC_OR_DEPARTMENT_OTHER)

## 2024-06-13 ENCOUNTER — Emergency Department (HOSPITAL_BASED_OUTPATIENT_CLINIC_OR_DEPARTMENT_OTHER): Admitting: Radiology

## 2024-06-13 ENCOUNTER — Other Ambulatory Visit (HOSPITAL_BASED_OUTPATIENT_CLINIC_OR_DEPARTMENT_OTHER): Payer: Self-pay

## 2024-06-13 ENCOUNTER — Encounter (HOSPITAL_BASED_OUTPATIENT_CLINIC_OR_DEPARTMENT_OTHER): Payer: Self-pay

## 2024-06-13 ENCOUNTER — Other Ambulatory Visit: Payer: Self-pay

## 2024-06-13 DIAGNOSIS — Z7982 Long term (current) use of aspirin: Secondary | ICD-10-CM | POA: Insufficient documentation

## 2024-06-13 DIAGNOSIS — R0789 Other chest pain: Secondary | ICD-10-CM | POA: Diagnosis not present

## 2024-06-13 DIAGNOSIS — R0602 Shortness of breath: Secondary | ICD-10-CM | POA: Diagnosis not present

## 2024-06-13 DIAGNOSIS — I6523 Occlusion and stenosis of bilateral carotid arteries: Secondary | ICD-10-CM | POA: Diagnosis not present

## 2024-06-13 DIAGNOSIS — R079 Chest pain, unspecified: Secondary | ICD-10-CM | POA: Insufficient documentation

## 2024-06-13 DIAGNOSIS — J449 Chronic obstructive pulmonary disease, unspecified: Secondary | ICD-10-CM | POA: Diagnosis not present

## 2024-06-13 DIAGNOSIS — R519 Headache, unspecified: Secondary | ICD-10-CM | POA: Diagnosis not present

## 2024-06-13 DIAGNOSIS — M542 Cervicalgia: Secondary | ICD-10-CM | POA: Insufficient documentation

## 2024-06-13 HISTORY — DX: Hyperlipidemia, unspecified: E78.5

## 2024-06-13 LAB — BASIC METABOLIC PANEL WITH GFR
Anion gap: 12 (ref 5–15)
BUN: 12 mg/dL (ref 8–23)
CO2: 25 mmol/L (ref 22–32)
Calcium: 10.1 mg/dL (ref 8.9–10.3)
Chloride: 102 mmol/L (ref 98–111)
Creatinine, Ser: 0.65 mg/dL (ref 0.44–1.00)
GFR, Estimated: >60 mL/min (ref >60)
Glucose, Bld: 140 mg/dL — ABNORMAL HIGH (ref 70–99)
Potassium: 3.7 mmol/L (ref 3.5–5.1)
Sodium: 140 mmol/L (ref 135–145)

## 2024-06-13 LAB — CBC
HCT: 37.1 % (ref 36.0–46.0)
Hemoglobin: 12.4 g/dL (ref 12.0–15.0)
MCH: 29.7 pg (ref 26.0–34.0)
MCHC: 33.4 g/dL (ref 30.0–36.0)
MCV: 89 fL (ref 80.0–100.0)
Platelets: 261 K/uL (ref 150–400)
RBC: 4.17 MIL/uL (ref 3.87–5.11)
RDW: 13.1 % (ref 11.5–15.5)
WBC: 8.9 K/uL (ref 4.0–10.5)
nRBC: 0 % (ref 0.0–0.2)

## 2024-06-13 LAB — TROPONIN T, HIGH SENSITIVITY
Troponin T High Sensitivity: <15 ng/L (ref 0–19)
Troponin T High Sensitivity: <15 ng/L (ref 0–19)

## 2024-06-13 MED ORDER — ACETAMINOPHEN 325 MG PO TABS
650.0000 mg | ORAL_TABLET | Freq: Once | ORAL | Status: AC
  Administered 2024-06-13: 650 mg via ORAL
  Filled 2024-06-13: qty 2

## 2024-06-13 MED ORDER — FENTANYL CITRATE (PF) 50 MCG/ML IJ SOSY
50.0000 ug | PREFILLED_SYRINGE | Freq: Once | INTRAMUSCULAR | Status: AC
  Administered 2024-06-13: 50 ug via INTRAVENOUS
  Filled 2024-06-13: qty 1

## 2024-06-13 MED ORDER — IOHEXOL 350 MG/ML SOLN
75.0000 mL | Freq: Once | INTRAVENOUS | Status: AC | PRN
  Administered 2024-06-13: 75 mL via INTRAVENOUS

## 2024-06-13 MED ORDER — METHOCARBAMOL 500 MG PO TABS: 500.0000 mg | ORAL_TABLET | Freq: Three times a day (TID) | ORAL | 0 refills | Status: DC | PRN

## 2024-06-13 MED ORDER — METHOCARBAMOL 500 MG PO TABS
500.0000 mg | ORAL_TABLET | Freq: Three times a day (TID) | ORAL | 0 refills | Status: AC | PRN
  Filled 2024-06-13: qty 8, 3d supply, fill #0

## 2024-06-13 NOTE — ED Triage Notes (Addendum)
 Chest pain, right neck pain, and headache onset this walking her dogs. She reports difficulty taking deep breath and nausea. Denies vomiting. Cardiac stent 6 weeks ago.  Took 2 NTG with with very mild relief.

## 2024-06-13 NOTE — ED Provider Notes (Signed)
 Monica Spence EMERGENCY DEPARTMENT AT [facility] Provider Note   CSN: [id number] Arrival date & time: 06/13/24  1015     Patient presents with: Chest Pain   Monica Spence is a 68 y.o. female.    Chest Pain Patient presents with pain.  Right neck going to right head.  Also some chest involvement.  History of cardiac cath with stent around 6 weeks ago.  States she was having pain at that time but not like this.  No numbness or weakness.  Pain maybe a little worse with moving her head.  Pain is still.     Prior to Admission medications   Medication Sig Start Date End Date Taking? Authorizing Provider  aspirin  EC 81 MG tablet Take 1 tablet (81 mg total) by mouth daily. Swallow whole. 04/25/24  Yes Goodrich, Callie E, PA-C  atorvastatin  (LIPITOR) 80 MG tablet Take 80 mg by mouth daily. 04/20/24  Yes [provider]  methocarbamol (ROBAXIN) 500 MG tablet Take 1 tablet (500 mg total) by mouth every 8 (eight) hours as needed. 06/13/24  Yes Patsey Lot, MD  nitroGLYCERIN  (NITROSTAT ) 0.4 MG SL tablet Place 0.4 mg under the tongue every 5 (five) minutes as needed. 04/20/24  Yes [provider]  ticagrelor  (BRILINTA ) 90 MG TABS tablet Take 1 tablet (90 mg total) by mouth 2 (two) times daily. 04/25/24  Yes Goodrich, Callie E, PA-C  VENTOLIN HFA 108 (90 Base) MCG/ACT inhaler Take 1-2 puffs by mouth daily. Inhale 1-2 puffs by mouth every 4 to 6 hours as needed for shortness of breath or wheezing 10/17/18  Yes [provider]    Allergies: Patient has no known allergies.    Review of Systems  Cardiovascular:  Positive for chest pain.    Updated Vital Signs BP (!) 147/104   Pulse 77   Temp (!) 97.5 F (36.4 C) (Oral)   Resp 16   Ht 5' 4 (1.626 m)   Wt 51.3 kg   SpO2 100%   BMI 19.40 kg/m   Physical Exam Vitals and nursing note reviewed.  HENT:     Head: Atraumatic.  Neck:     Vascular: No hepatojugular reflux or JVD.  Cardiovascular:     Rate  and Rhythm: Regular rhythm.  Pulmonary:     Breath sounds: No decreased breath sounds or wheezing.  Chest:     Chest wall: No tenderness.  Abdominal:     Palpations: There is no hepatomegaly.  Musculoskeletal:     Cervical back: Neck supple.  Skin:    General: Skin is warm.     Capillary Refill: Capillary refill takes less than 2 seconds.  Neurological:     Mental Status: She is alert.     (all labs ordered are listed, but only abnormal results are displayed) Labs Reviewed  BASIC METABOLIC PANEL WITH GFR - Abnormal; Notable for the following components:      Result Value   Glucose, Bld 140 (*)    All other components within normal limits  CBC  TROPONIN T, HIGH SENSITIVITY  TROPONIN T, HIGH SENSITIVITY    EKG: EKG Interpretation Date/Time:  Saturday June 13 2024 10:23:11 EDT Ventricular Rate:  83 PR Interval:  141 QRS Duration:  82 QT Interval:  376 QTC Calculation: 442 R Axis:   69  Text Interpretation: Sinus rhythm Anteroseptal infarct, age indeterminate No significant change since last tracing Confirmed by Patsey Lot [phone]) on 06/13/2024 10:33:29 AM  Radiology: CT ANGIO HEAD NECK W  WO CM Result Date: 06/13/2024 EXAM: CTA HEAD AND NECK WITH AND WITHOUT 06/13/2024 11:24:48 AM TECHNIQUE: CTA of the head and neck was performed with and without the administration of 75 mL of iohexol  (OMNIPAQUE ) 350 MG/ML injection. Multiplanar 2D and/or 3D reformatted images are provided for review. Automated exposure control, iterative reconstruction, and/or weight based adjustment of the mA/kV was utilized to reduce the radiation dose to as low as reasonably achievable. Stenosis of the internal carotid arteries measured using NASCET criteria. COMPARISON: None available CLINICAL HISTORY: Neck and head pain after cardiac cath. FINDINGS: CTA NECK: AORTIC ARCH AND ARCH VESSELS: No dissection or arterial injury. No significant stenosis of the brachiocephalic or subclavian arteries.  CERVICAL CAROTID ARTERIES: Mild atherosclerotic calcification at both carotid bifurcations but no hemodynamically significant stenosis by NASCET criteria. No dissection or arterial injury. CERVICAL VERTEBRAL ARTERIES: No dissection, arterial injury, or significant stenosis. LUNGS AND MEDIASTINUM: Unremarkable. SOFT TISSUES: No acute abnormality. BONES: No acute abnormality. CTA HEAD: ANTERIOR CIRCULATION: No significant stenosis of the internal carotid arteries. No significant stenosis of the anterior cerebral arteries. No significant stenosis of the middle cerebral arteries. No aneurysm. POSTERIOR CIRCULATION: No significant stenosis of the posterior cerebral arteries. No significant stenosis of the basilar artery. No significant stenosis of the vertebral arteries. No aneurysm. OTHER: No mass, hemorrhage, or extra-axial collection. Normal appearance of the brain parenchyma and CSF spaces for age. No hyperdense vessel. No dural venous sinus thrombosis on this non-dedicated study. IMPRESSION: 1. No acute intracranial abnormality. 2. Atherosclerotic calcifications at both carotid bifurcations without hemodynamically significant stenosis. Electronically signed by: Franky Stanford MD 06/13/2024 12:01 PM EDT RP Workstation: [id number]   DG Chest 2 View Result Date: 06/13/2024 CLINICAL DATA:  Chest pain and shortness of breath. EXAM: CHEST - 2 VIEW COMPARISON:  04/22/2024 FINDINGS: The heart size and mediastinal contours are within normal limits. Pulmonary hyperinflation and central peribronchial thickening again seen, consistent with COPD. No evidence of pulmonary infiltrate or edema. No pleural effusion or pneumothorax. The visualized skeletal structures are unremarkable. IMPRESSION: COPD. No active cardiopulmonary disease. Electronically Signed   By: Norleen DELENA Kil M.D.   On: 06/13/2024 10:59     Procedures   Medications Ordered in the ED  fentaNYL  (SUBLIMAZE ) injection 50 mcg (50 mcg Intravenous Given 06/13/24  1100)  iohexol  (OMNIPAQUE ) 350 MG/ML injection 75 mL (75 mLs Intravenous Contrast Given 06/13/24 1117)  fentaNYL  (SUBLIMAZE ) injection 50 mcg (50 mcg Intravenous Given 06/13/24 1211)  acetaminophen  (TYLENOL ) tablet 650 mg (650 mg Oral Given 06/13/24 1216)                                    Medical Decision Making Amount and/or Complexity of Data Reviewed Labs: ordered. Radiology: ordered.  Risk OTC drugs. Prescription drug management.   Patient with pain going from neck up to head.  Recent heart cath around 6 weeks ago.  Worse with some deep breathing.  With pain particularly up in the neck and head will get CT scan to evaluate vasculature presents higher risk for causes such as dissection with procedure.  X-ray reassuring.  EKG reassuring.  Reviewed previous heart cath report.  Blood work reassuring.  Has been given pain meds.  CTA reassuring with no dissection or other clear cause of the pain.  Appears stable for discharge home.  Potentially musculoskeletal pain.  Doubt other causes such as pulmonary embolism with pain being primarily in neck.  I doubt  dissection in the aorta.  Will discharge home.     Final diagnoses:  Neck pain    ED Discharge Orders          Ordered    methocarbamol (ROBAXIN) 500 MG tablet  Every 8 hours PRN        06/13/24 1322               Patsey Lot, MD 06/13/24 1324

## 2024-06-13 NOTE — ED Notes (Signed)
 Pt given discharge instructions and reviewed prescriptions. Opportunities given for questions. Pt verbalizes understanding. PIV removed x1. Bethena Powell SAUNDERS, RN

## 2024-06-13 NOTE — Discharge Instructions (Signed)
 It appears that the pain may be coming from your neck.  Your heart vessels and brain look okay.  Will give you some muscle relaxer to take to help with symptoms.  Follow-up with your doctors as needed.

## 2024-06-15 ENCOUNTER — Encounter (HOSPITAL_COMMUNITY)

## 2024-06-17 ENCOUNTER — Encounter (HOSPITAL_COMMUNITY)

## 2024-06-17 ENCOUNTER — Encounter (HOSPITAL_COMMUNITY): Payer: Self-pay

## 2024-06-17 DIAGNOSIS — Z955 Presence of coronary angioplasty implant and graft: Secondary | ICD-10-CM

## 2024-06-17 NOTE — Progress Notes (Signed)
 Cardiac Individual Treatment Plan  Patient Details  Name: Monica Spence MRN: [id number] Date of Birth: 02/28/1956 Referring Provider:   Flowsheet Row CARDIAC REHAB PHASE II ORIENTATION from 05/13/2024 in [facility] CARDIAC REHABILITATION  Referring Provider Shelda Bruckner MD    Initial Encounter Date:  Flowsheet Row CARDIAC REHAB PHASE II ORIENTATION from 05/13/2024 in [place] [facility] CARDIAC RE[facility]ILITATION  Date 05/13/24    Visit Diagnosis: Status post coronary artery stent placement  Patient's Home Medications on Admission:  Current Outpatient Medications:    aspirin  EC 81 MG tablet, Take 1 tablet (81 mg total) by mouth daily. Swallow whole., Disp: 90 tablet, Rfl: 3   atorvastatin  (LIPITOR) 80 MG tablet, Take 80 mg by mouth daily., Disp: , Rfl:    methocarbamol (ROBAXIN) 500 MG tablet, Take 1 tablet (500 mg total) by mouth every 8 (eight) hours as needed., Disp: 8 tablet, Rfl: 0   nitroGLYCERIN  (NITROSTAT ) 0.4 MG SL tablet, Place 0.4 mg under the tongue every 5 (five) minutes as needed., Disp: , Rfl:    ticagrelor  (BRILINTA ) 90 MG TABS tablet, Take 1 tablet (90 mg total) by mouth 2 (two) times daily., Disp: 60 tablet, Rfl: 11   VENTOLIN HFA 108 (90 Base) MCG/ACT inhaler, Take 1-2 puffs by mouth daily. Inhale 1-2 puffs by mouth every 4 to 6 hours as needed for shortness of breath or wheezing, Disp: , Rfl:   Past Medical History: Past Medical History:  Diagnosis Date   COPD (chronic obstructive pulmonary disease) (HCC)    Hyperlipidemia    PONV (postoperative nausea and vomiting)     Tobacco Use: Social History   Tobacco Use  Smoking Status Former  Smokeless Tobacco Never    Labs: Review Flowsheet       Latest Ref Rng & Units 04/21/2024 04/23/2024 04/24/2024  Labs for ITP Cardiac and Pulmonary Rehab  Cholestrol 0 - 200 mg/dL - - 791   LDL (calc) 0 - 99 mg/dL - - 867   HDL-C >59 mg/dL - - 52   Trlycerides <849 mg/dL - - 880   Hemoglobin J8r 4.8 - 5.6 % - 4.8  -   TCO2 22 - 32 mmol/L 25  - -     Exercise Target Goals: Exercise Program Goal: Individual exercise prescription set using results from initial 6 min walk test and THRR while considering  patient's activity barriers and safety.   Exercise Prescription Goal: Initial exercise prescription builds to 30-45 minutes a day of aerobic activity, 2-3 days per week.  Home exercise guidelines will be given to patient during program as part of exercise prescription that the participant will acknowledge.   Education: Aerobic Exercise: - Group verbal and visual presentation on the components of exercise prescription. Introduces F.I.T.T principle from ACSM for exercise prescriptions.  Reviews F.I.T.T. principles of aerobic exercise including progression. Written material provided at class time.   Education: Resistance Exercise: - Group verbal and visual presentation on the components of exercise prescription. Introduces F.I.T.T principle from ACSM for exercise prescriptions  Reviews F.I.T.T. principles of resistance exercise including progression. Written material provided at class time.    Education: Exercise & Equipment Safety: - Individual verbal instruction and demonstration of equipment use and safety with use of the equipment.   Education: Exercise Physiology & General Exercise Guidelines: - Group verbal and written instruction with models to review the exercise physiology of the cardiovascular system and associated critical values. Provides general exercise guidelines with specific guidelines to those with heart or lung disease. Written  material provided at class time.   Education: Flexibility, Balance, Mind/Body Relaxation: - Group verbal and visual presentation with interactive activity on the components of exercise prescription. Introduces F.I.T.T principle from ACSM for exercise prescriptions. Reviews F.I.T.T. principles of flexibility and balance exercise training including progression. Also  discusses the mind body connection.  Reviews various relaxation techniques to help reduce and manage stress (i.e. Deep breathing, progressive muscle relaxation, and visualization). Balance handout provided to take home. Written material provided at class time.   Activity Barriers & Risk Stratification:  Activity Barriers & Cardiac Risk Stratification - 05/08/24 1042       Activity Barriers & Cardiac Risk Stratification   Activity Barriers Back Problems;History of Falls   Burning sensation in chest occasionally.   Cardiac Risk Stratification Moderate          6 Minute Walk:  6 Minute Walk     Row Name 05/13/24 1431         6 Minute Walk   Phase Initial     Distance 1200 feet     Walk Time 6 minutes     # of Rest Breaks 0     MPH 2.27     METS 3.02     RPE 11     Perceived Dyspnea  0     VO2 Peak 10.6     Symptoms No     Resting HR 72 bpm     Resting BP 120/70     Resting Oxygen Saturation  96 %     Exercise Oxygen Saturation  during 6 min walk 96 %     Max Ex. HR 86 bpm     Max Ex. BP 136/70     2 Minute Post BP 122/60        Oxygen Initial Assessment:   Oxygen Re-Evaluation:   Oxygen Discharge (Final Oxygen Re-Evaluation):   Initial Exercise Prescription:  Initial Exercise Prescription - 05/13/24 1400       Date of Initial Exercise RX and Referring Provider   Date 05/13/24    Referring Provider Shelda Bruckner MD      Treadmill   MPH 1.2    Grade 0    Minutes 15    METs 1.92      REL-XR   Level 1    Speed 50    Minutes 15    METs 1.9      Prescription Details   Frequency (times per week) 3    Duration Progress to 30 minutes of continuous aerobic without signs/symptoms of physical distress      Intensity   THRR 40-80% of Max Heartrate 104-136    Ratings of Perceived Exertion 11-13    Perceived Dyspnea 0-4      Resistance Training   Training Prescription Yes    Weight 3    Reps 10-15          Perform Capillary Blood  Glucose checks as needed.  Exercise Prescription Changes:   Exercise Prescription Changes     Row Name 05/13/24 1400             Response to Exercise   Blood Pressure (Admit) 120/70       Blood Pressure (Exercise) 136/70       Blood Pressure (Exit) 122/60       Heart Rate (Admit) 72 bpm       Heart Rate (Exercise) 86 bpm       Heart Rate (Exit) 73 bpm  Oxygen Saturation (Admit) 96 %       Oxygen Saturation (Exercise) 96 %       Oxygen Saturation (Exit) 96 %       Rating of Perceived Exertion (Exercise) 11       Perceived Dyspnea (Exercise) 0          Exercise Comments:   Exercise Comments     Row Name 05/18/24 1107           Exercise Comments First full day of exercise!  Patient was oriented to gym and equipment including functions, settings, policies, and procedures.  Patient's individual exercise prescription and treatment plan were reviewed.  All starting workloads were established based on the results of the 6 minute walk test done at initial orientation visit.  The plan for exercise progression was also introduced and progression will be customized based on patient's performance and goals.          Exercise Goals and Review:   Exercise Goals     Row Name 05/13/24 1502             Exercise Goals   Increase Physical Activity Yes       Intervention Provide advice, education, support and counseling about physical activity/exercise needs.;Develop an individualized exercise prescription for aerobic and resistive training based on initial evaluation findings, risk stratification, comorbidities and participant's personal goals.       Expected Outcomes Short Term: Attend rehab on a regular basis to increase amount of physical activity.;Long Term: Add in home exercise to make exercise part of routine and to increase amount of physical activity.;Long Term: Exercising regularly at least 3-5 days a week.       Increase Strength and Stamina Yes       Intervention  Provide advice, education, support and counseling about physical activity/exercise needs.;Develop an individualized exercise prescription for aerobic and resistive training based on initial evaluation findings, risk stratification, comorbidities and participant's personal goals.       Expected Outcomes Short Term: Increase workloads from initial exercise prescription for resistance, speed, and METs.;Short Term: Perform resistance training exercises routinely during rehab and add in resistance training at home;Long Term: Improve cardiorespiratory fitness, muscular endurance and strength as measured by increased METs and functional capacity ( )       Able to understand and use rate of perceived exertion (RPE) scale Yes       Intervention Provide education and explanation on how to use RPE scale       Expected Outcomes Short Term: Able to use RPE daily in rehab to express subjective intensity level;Long Term:  Able to use RPE to guide intensity level when exercising independently       Able to understand and use Dyspnea scale Yes       Intervention Provide education and explanation on how to use Dyspnea scale       Expected Outcomes Short Term: Able to use Dyspnea scale daily in rehab to express subjective sense of shortness of breath during exertion;Long Term: Able to use Dyspnea scale to guide intensity level when exercising independently       Knowledge and understanding of Target Heart Rate Range (THRR) Yes       Intervention Provide education and explanation of THRR including how the numbers were predicted and where they are located for reference       Expected Outcomes Short Term: Able to state/look up THRR;Long Term: Able to use THRR to govern intensity when exercising independently;Short Term:  Able to use daily as guideline for intensity in rehab       Able to check pulse independently Yes       Intervention Provide education and demonstration on how to check pulse in carotid and radial  arteries.;Review the importance of being able to check your own pulse for safety during independent exercise       Expected Outcomes Short Term: Able to explain why pulse checking is important during independent exercise;Long Term: Able to check pulse independently and accurately       Understanding of Exercise Prescription Yes       Intervention Provide education, explanation, and written materials on patient's individual exercise prescription       Expected Outcomes Short Term: Able to explain program exercise prescription;Long Term: Able to explain home exercise prescription to exercise independently          Exercise Goals Re-Evaluation :   Discharge Exercise Prescription (Final Exercise Prescription Changes):  Exercise Prescription Changes - 05/13/24 1400       Response to Exercise   Blood Pressure (Admit) 120/70    Blood Pressure (Exercise) 136/70    Blood Pressure (Exit) 122/60    Heart Rate (Admit) 72 bpm    Heart Rate (Exercise) 86 bpm    Heart Rate (Exit) 73 bpm    Oxygen Saturation (Admit) 96 %    Oxygen Saturation (Exercise) 96 %    Oxygen Saturation (Exit) 96 %    Rating of Perceived Exertion (Exercise) 11    Perceived Dyspnea (Exercise) 0          Nutrition:  Target Goals: Understanding of nutrition guidelines, daily intake of sodium 1500mg , cholesterol 200mg , calories 30% from fat and 7% or less from saturated fats, daily to have 5 or more servings of fruits and vegetables.  Education: Nutrition 1 -Group instruction provided by verbal, written material, interactive activities, discussions, models, and posters to present general guidelines for heart healthy nutrition including macronutrients, label reading, and promoting whole foods over processed counterparts. Education serves as pensions consultant of discussion of heart healthy eating for all. Written material provided at class time.    Education: Nutrition 2 -Group instruction provided by verbal, written material,  interactive activities, discussions, models, and posters to present general guidelines for heart healthy nutrition including sodium, cholesterol, and saturated fat. Providing guidance of habit forming to improve blood pressure, cholesterol, and body weight. Written material provided at class time.     Biometrics:  Pre Biometrics - 05/13/24 1444       Pre Biometrics   Height 5' 3 (1.6 m)    Weight 114 lb 3.2 oz (51.8 kg)    Waist Circumference 28 inches    Hip Circumference 35 inches    Waist to Hip Ratio 0.8 %    BMI (Calculated) 20.23    Grip Strength 20.7 kg    Single Leg Stand 9.29 seconds           Nutrition Therapy Plan and Nutrition Goals:   Nutrition Assessments:  MEDIFICTS Score Key: >=70 Need to make dietary changes  40-70 Heart Healthy Diet <= 40 Therapeutic Level Cholesterol Diet  Flowsheet Row CARDIAC VIRTUAL BASED CARE from 05/08/2024 in [facility] CARDIAC REHABILITATION  Picture Your Plate Total Score on Admission 52   Picture Your Plate Scores: <59 Unhealthy dietary pattern with much room for improvement. 41-50 Dietary pattern unlikely to meet recommendations for good health and room for improvement. 51-60 More healthful dietary pattern, with some room for  improvement.  >60 Healthy dietary pattern, although there may be some specific behaviors that could be improved.    Nutrition Goals Re-Evaluation:   Nutrition Goals Discharge (Final Nutrition Goals Re-Evaluation):   Psychosocial: Target Goals: Acknowledge presence or absence of significant depression and/or stress, maximize coping skills, provide positive support system. Participant is able to verbalize types and ability to use techniques and skills needed for reducing stress and depression.   Education: Stress, Anxiety, and Depression - Group verbal and visual presentation to define topics covered.  Reviews how body is impacted by stress, anxiety, and depression.  Also discusses healthy ways to  reduce stress and to treat/manage anxiety and depression. Written material provided at class time.   Education: Sleep Hygiene -Provides group verbal and written instruction about how sleep can affect your health.  Define sleep hygiene, discuss sleep cycles and impact of sleep habits. Review good sleep hygiene tips.   Initial Review & Psychosocial Screening:  Initial Psych Review & Screening - 05/08/24 1059       Initial Review   Current issues with None Identified      Family Dynamics   Good Support System? Yes    Comments Patient son, daugher and boy friend support her.      Barriers   Psychosocial barriers to participate in program The patient should benefit from training in stress management and relaxation.;There are no identifiable barriers or psychosocial needs.      Screening Interventions   Interventions To provide support and resources with identified psychosocial needs;Encouraged to exercise;Provide feedback about the scores to participant    Expected Outcomes Short Term goal: Utilizing psychosocial counselor, staff and physician to assist with identification of specific Stressors or current issues interfering with healing process. Setting desired goal for each stressor or current issue identified.;Long Term Goal: Stressors or current issues are controlled or eliminated.;Short Term goal: Identification and review with participant of any Quality of Life or Depression concerns found by scoring the questionnaire.;Long Term goal: The participant improves quality of Life and PHQ9 Scores as seen by post scores and/or verbalization of changes          Quality of Life Scores:   Quality of Life - 05/08/24 1058       Quality of Life   Select Quality of Life      Quality of Life Scores   Health/Function Pre 22.73 %    Socioeconomic Pre 25.94 %    Psych/Spiritual Pre 25.36 %    Family Pre 30 %    GLOBAL Pre 25.03 %         Scores of 19 and below usually indicate a poorer  quality of life in these areas.  A difference of  2-3 points is a clinically meaningful difference.  A difference of 2-3 points in the total score of the Quality of Life Index has been associated with significant improvement in overall quality of life, self-image, physical symptoms, and general health in studies assessing change in quality of life.  PHQ-9: Review Flowsheet       05/13/2024  Depression screen PHQ 2/9  Decreased Interest 0  Down, Depressed, Hopeless 0  PHQ - 2 Score 0  Altered sleeping 0  Tired, decreased energy 2  Change in appetite 2  Feeling bad or failure about yourself  0  Trouble concentrating 0  Moving slowly or fidgety/restless 0  Suicidal thoughts 0  PHQ-9 Score 4  Difficult doing work/chores Not difficult at all   Interpretation of Total  Score  Total Score Depression Severity:  1-4 = Minimal depression, 5-9 = Mild depression, 10-14 = Moderate depression, 15-19 = Moderately severe depression, 20-27 = Severe depression   Psychosocial Evaluation and Intervention:  Psychosocial Evaluation - 05/08/24 1059       Psychosocial Evaluation & Interventions   Interventions Relaxation education;Encouraged to exercise with the program and follow exercise prescription;Stress management education    Comments Patient was referred to cardiac rehab with stent placement 04/23/24. She denies any depression, anxiety, or stressors currently other than having the stent procedure. She has a significant family history of heart disease and wants to learn how to change her lifestyle to be as healthy as she can.  She says she sleeps well at night. She has a $40 co-payment but says this will not be a barrier for her to participate in the program. Her goals are as stated above and to learn what she is able to do. She has no barriers identified to complete the program.    Expected Outcomes Short Term: Patient will start the program and attend consistently. Long Term: Patient will complete the  program meeting personal goals.    Continue Psychosocial Services  Follow up required by staff          Psychosocial Re-Evaluation:   Psychosocial Discharge (Final Psychosocial Re-Evaluation):   Vocational Rehabilitation: Provide vocational rehab assistance to qualifying candidates.   Vocational Rehab Evaluation & Intervention:  Vocational Rehab - 05/08/24 1058       Initial Vocational Rehab Evaluation & Intervention   Assessment shows need for Vocational Rehabilitation No      Vocational Rehab Re-Evaulation   Comments Patient is retired.          Education: Education Goals: Education classes will be provided on a variety of topics geared toward better understanding of heart health and risk factor modification. Participant will state understanding/return demonstration of topics presented as noted by education test scores.  Learning Barriers/Preferences:  Learning Barriers/Preferences - 05/08/24 1056       Learning Barriers/Preferences   Learning Barriers None    Learning Preferences Skilled Demonstration          General Cardiac Education Topics:  AED/CPR: - Group verbal and written instruction with the use of models to demonstrate the basic use of the AED with the basic ABC's of resuscitation.   Test and Procedures: - Group verbal and visual presentation and models provide information about basic cardiac anatomy and function. Reviews the testing methods done to diagnose heart disease and the outcomes of the test results. Describes the treatment choices: Medical Management, Angioplasty, or Coronary Bypass Surgery for treating various heart conditions including Myocardial Infarction, Angina, Valve Disease, and Cardiac Arrhythmias. Written material provided at class time. Flowsheet Row CARDIAC REHAB PHASE II EXERCISE from 05/27/2024 in [facility] CARDIAC REHABILITATION  Date 05/27/24  Educator [facility]  Instruction Review Code 1- Verbalizes Understanding     Medication Safety: - Group verbal and visual instruction to review commonly prescribed medications for heart and lung disease. Reviews the medication, class of the drug, and side effects. Includes the steps to properly store meds and maintain the prescription regimen. Written material provided at class time.   Intimacy: - Group verbal instruction through game format to discuss how heart and lung disease can affect sexual intimacy. Written material provided at class time.   Know Your Numbers and Heart Failure: - Group verbal and visual instruction to discuss disease risk factors for cardiac and pulmonary disease and treatment  options.  Reviews associated critical values for Overweight/Obesity, Hypertension, Cholesterol, and Diabetes.  Discusses basics of heart failure: signs/symptoms and treatments.  Introduces Heart Failure Zone chart for action plan for heart failure. Written material provided at class time.   Infection Prevention: - Provides verbal and written material to individual with discussion of infection control including proper hand washing and proper equipment cleaning during exercise session.   Falls Prevention: - Provides verbal and written material to individual with discussion of falls prevention and safety.   Other: -Provides group and verbal instruction on various topics (see comments)   Knowledge Questionnaire Score:  Knowledge Questionnaire Score - 05/08/24 1056       Knowledge Questionnaire Score   Pre Score 25/26          Core Components/Risk Factors/Patient Goals at Admission:  Personal Goals and Risk Factors at Admission - 05/08/24 1058       Core Components/Risk Factors/Patient Goals on Admission    Weight Management Weight Maintenance    Lipids Yes    Intervention Provide education and support for participant on nutrition & aerobic/resistive exercise along with prescribed medications to achieve LDL 70mg , HDL >40mg .    Expected Outcomes Short  Term: Participant states understanding of desired cholesterol values and is compliant with medications prescribed. Participant is following exercise prescription and nutrition guidelines.;Long Term: Cholesterol controlled with medications as prescribed, with individualized exercise RX and with personalized nutrition plan. Value goals: LDL < 70mg , HDL > 40 mg.          Education:Diabetes - Individual verbal and written instruction to review signs/symptoms of diabetes, desired ranges of glucose level fasting, after meals and with exercise. Acknowledge that pre and post exercise glucose checks will be done for 3 sessions at entry of program.   Core Components/Risk Factors/Patient Goals Review:    Core Components/Risk Factors/Patient Goals at Discharge (Final Review):    ITP Comments:  ITP Comments     Row Name 05/08/24 1108 05/13/24 1423 05/18/24 1107 05/20/24 1333 06/17/24 0813   ITP Comments Virtual orientation visit completed for cardiac rehab with stent placement. On-site orientation visit scheduled for 05/13/24. Patient arrived for 1st visit/orientation/education at 1300. Patient was referred to CR by Dr. Soyla Merck attending/Referred by Dr. Shelda Bruckner due to Coronary artery Stent placement. During orientation advised patient on arrival and appointment times what to wear, what to do before, during and after exercise. Reviewed attendance and class policy.  Pt is scheduled to return Cardiac Rehab on 05/18/24 at 1100. Pt was advised to come to class 15 minutes before class starts.  Discussed RPE/Dpysnea scales. Patient participated in warm up stretches. Patient was able to complete 6 minute walk test.  Telemetry:NSR. Patient was measured for the equipment. Discussed equipment safety with patient. Took patient pre-anthropometric measurements. Patient finished visit at 1423. First full day of exercise!  Patient was oriented to gym and equipment including functions, settings, policies,  and procedures.  Patient's individual exercise prescription and treatment plan were reviewed.  All starting workloads were established based on the results of the 6 minute walk test done at initial orientation visit.  The plan for exercise progression was also introduced and progression will be customized based on patient's performance and goals. 30 day review completed. ITP sent to Dr. Dorn Ross, Medical Director of Cardiac Rehab. Continue with ITP unless changes are made by physician. 30 day review completed. ITP sent to Dr. Dorn Ross, Medical Director of Cardiac Rehab. Continue with ITP unless changes are made  by physician. Currently no goals due to attendance.      Comments: 30 day review

## 2024-06-18 ENCOUNTER — Ambulatory Visit: Admitting: Cardiovascular Disease

## 2024-06-19 ENCOUNTER — Encounter (HOSPITAL_COMMUNITY)

## 2024-06-22 ENCOUNTER — Encounter (HOSPITAL_COMMUNITY)

## 2024-06-24 ENCOUNTER — Encounter (HOSPITAL_COMMUNITY)

## 2024-06-26 ENCOUNTER — Encounter (HOSPITAL_COMMUNITY)

## 2024-06-29 ENCOUNTER — Encounter (HOSPITAL_COMMUNITY)
Admission: RE | Admit: 2024-06-29 | Discharge: 2024-06-29 | Disposition: A | Discharge status: Home / Self Care | Source: Ambulatory Visit | Attending: Internal Medicine | Admitting: Internal Medicine

## 2024-06-29 NOTE — Progress Notes (Signed)
 Cardiac Individual Treatment Plan  Patient Details  Name: Monica Spence MRN: [id number] Date of Birth: 1956-08-13 Referring Provider:   Flowsheet Row CARDIAC REHAB PHASE II ORIENTATION from 05/13/2024 in [place] [facility]  Referring Provider Shelda Bruckner MD    Initial Encounter Date:  Flowsheet Row CARDIAC REHAB PHASE II ORIENTATION from 05/13/2024 in [facility]  Date 05/13/24    Visit Diagnosis: No diagnosis found.  Patient's Home Medications on Admission:  Current Outpatient Medications:    aspirin  EC 81 MG tablet, Take 1 tablet (81 mg total) by mouth daily. Swallow whole., Disp: 90 tablet, Rfl: 3   atorvastatin  (LIPITOR) 80 MG tablet, Take 80 mg by mouth daily., Disp: , Rfl:    methocarbamol (ROBAXIN) 500 MG tablet, Take 1 tablet (500 mg total) by mouth every 8 (eight) hours as needed., Disp: 8 tablet, Rfl: 0   nitroGLYCERIN  (NITROSTAT ) 0.4 MG SL tablet, Place 0.4 mg under the tongue every 5 (five) minutes as needed., Disp: , Rfl:    ticagrelor  (BRILINTA ) 90 MG TABS tablet, Take 1 tablet (90 mg total) by mouth 2 (two) times daily., Disp: 60 tablet, Rfl: 11   VENTOLIN HFA 108 (90 Base) MCG/ACT inhaler, Take 1-2 puffs by mouth daily. Inhale 1-2 puffs by mouth every 4 to 6 hours as needed for shortness of breath or wheezing, Disp: , Rfl:   Past Medical History: Past Medical History:  Diagnosis Date   COPD (chronic obstructive pulmonary disease) (HCC)    Hyperlipidemia    PONV (postoperative nausea and vomiting)     Tobacco Use: Social History   Tobacco Use  Smoking Status Former  Smokeless Tobacco Never    Labs: Review Flowsheet       Latest Ref Rng & Units 04/21/2024 04/23/2024 04/24/2024  Labs for ITP Cardiac and Pulmonary Rehab  Cholestrol 0 - 200 mg/dL - - 791   LDL (calc) 0 - 99 mg/dL - - 867   HDL-C >59 mg/dL - - 52   Trlycerides <849 mg/dL - - 880   Hemoglobin J8r 4.8 - 5.6 % - 4.8  -  TCO2 22 - 32 mmol/L 25  -  -     Exercise Target Goals: Exercise Program Goal: Individual exercise prescription set using results from initial 6 min walk test and THRR while considering  patient's activity barriers and safety.   Exercise Prescription Goal: Initial exercise prescription builds to 30-45 minutes a day of aerobic activity, 2-3 days per week.  Home exercise guidelines will be given to patient during program as part of exercise prescription that the participant will acknowledge.   Education: Aerobic Exercise: - Group verbal and visual presentation on the components of exercise prescription. Introduces F.I.T.T principle from ACSM for exercise prescriptions.  Reviews F.I.T.T. principles of aerobic exercise including progression. Written material provided at class time.   Education: Resistance Exercise: - Group verbal and visual presentation on the components of exercise prescription. Introduces F.I.T.T principle from ACSM for exercise prescriptions  Reviews F.I.T.T. principles of resistance exercise including progression. Written material provided at class time.    Education: Exercise & Equipment Safety: - Individual verbal instruction and demonstration of equipment use and safety with use of the equipment.   Education: Exercise Physiology & General Exercise Guidelines: - Group verbal and written instruction with models to review the exercise physiology of the cardiovascular system and associated critical values. Provides general exercise guidelines with specific guidelines to those with heart or lung disease. Written material provided at  class time.   Education: Flexibility, Balance, Mind/Body Relaxation: - Group verbal and visual presentation with interactive activity on the components of exercise prescription. Introduces F.I.T.T principle from ACSM for exercise prescriptions. Reviews F.I.T.T. principles of flexibility and balance exercise training including progression. Also discusses the mind body  connection.  Reviews various relaxation techniques to help reduce and manage stress (i.e. Deep breathing, progressive muscle relaxation, and visualization). Balance handout provided to take home. Written material provided at class time.   Activity Barriers & Risk Stratification:  Activity Barriers & Cardiac Risk Stratification - 05/08/24 1042       Activity Barriers & Cardiac Risk Stratification   Activity Barriers Back Problems;History of Falls   Burning sensation in chest occasionally.   Cardiac Risk Stratification Moderate          6 Minute Walk:  6 Minute Walk     Row Name 05/13/24 1431         6 Minute Walk   Phase Initial     Distance 1200 feet     Walk Time 6 minutes     # of Rest Breaks 0     MPH 2.27     METS 3.02     RPE 11     Perceived Dyspnea  0     VO2 Peak 10.6     Symptoms No     Resting HR 72 bpm     Resting BP 120/70     Resting Oxygen Saturation  96 %     Exercise Oxygen Saturation  during 6 min walk 96 %     Max Ex. HR 86 bpm     Max Ex. BP 136/70     2 Minute Post BP 122/60        Oxygen Initial Assessment:   Oxygen Re-Evaluation:   Oxygen Discharge (Final Oxygen Re-Evaluation):   Initial Exercise Prescription:  Initial Exercise Prescription - 05/13/24 1400       Date of Initial Exercise RX and Referring Provider   Date 05/13/24    Referring Provider Shelda Bruckner MD      Treadmill   MPH 1.2    Grade 0    Minutes 15    METs 1.92      REL-XR   Level 1    Speed 50    Minutes 15    METs 1.9      Prescription Details   Frequency (times per week) 3    Duration Progress to 30 minutes of continuous aerobic without signs/symptoms of physical distress      Intensity   THRR 40-80% of Max Heartrate 104-136    Ratings of Perceived Exertion 11-13    Perceived Dyspnea 0-4      Resistance Training   Training Prescription Yes    Weight 3    Reps 10-15          Perform Capillary Blood Glucose checks as  needed.  Exercise Prescription Changes:   Exercise Prescription Changes     Row Name 05/13/24 1400             Response to Exercise   Blood Pressure (Admit) 120/70       Blood Pressure (Exercise) 136/70       Blood Pressure (Exit) 122/60       Heart Rate (Admit) 72 bpm       Heart Rate (Exercise) 86 bpm       Heart Rate (Exit) 73 bpm  Oxygen Saturation (Admit) 96 %       Oxygen Saturation (Exercise) 96 %       Oxygen Saturation (Exit) 96 %       Rating of Perceived Exertion (Exercise) 11       Perceived Dyspnea (Exercise) 0          Exercise Comments:   Exercise Comments     Row Name 05/18/24 1107           Exercise Comments First full day of exercise!  Patient was oriented to gym and equipment including functions, settings, policies, and procedures.  Patient's individual exercise prescription and treatment plan were reviewed.  All starting workloads were established based on the results of the 6 minute walk test done at initial orientation visit.  The plan for exercise progression was also introduced and progression will be customized based on patient's performance and goals.          Exercise Goals and Review:   Exercise Goals     Row Name 05/13/24 1502             Exercise Goals   Increase Physical Activity Yes       Intervention Provide advice, education, support and counseling about physical activity/exercise needs.;Develop an individualized exercise prescription for aerobic and resistive training based on initial evaluation findings, risk stratification, comorbidities and participant's personal goals.       Expected Outcomes Short Term: Attend rehab on a regular basis to increase amount of physical activity.;Long Term: Add in home exercise to make exercise part of routine and to increase amount of physical activity.;Long Term: Exercising regularly at least 3-5 days a week.       Increase Strength and Stamina Yes       Intervention Provide advice,  education, support and counseling about physical activity/exercise needs.;Develop an individualized exercise prescription for aerobic and resistive training based on initial evaluation findings, risk stratification, comorbidities and participant's personal goals.       Expected Outcomes Short Term: Increase workloads from initial exercise prescription for resistance, speed, and METs.;Short Term: Perform resistance training exercises routinely during rehab and add in resistance training at home;Long Term: Improve cardiorespiratory fitness, muscular endurance and strength as measured by increased METs and functional capacity ( )       Able to understand and use rate of perceived exertion (RPE) scale Yes       Intervention Provide education and explanation on how to use RPE scale       Expected Outcomes Short Term: Able to use RPE daily in rehab to express subjective intensity level;Long Term:  Able to use RPE to guide intensity level when exercising independently       Able to understand and use Dyspnea scale Yes       Intervention Provide education and explanation on how to use Dyspnea scale       Expected Outcomes Short Term: Able to use Dyspnea scale daily in rehab to express subjective sense of shortness of breath during exertion;Long Term: Able to use Dyspnea scale to guide intensity level when exercising independently       Knowledge and understanding of Target Heart Rate Range (THRR) Yes       Intervention Provide education and explanation of THRR including how the numbers were predicted and where they are located for reference       Expected Outcomes Short Term: Able to state/look up THRR;Long Term: Able to use THRR to govern intensity when exercising independently;Short Term:  Able to use daily as guideline for intensity in rehab       Able to check pulse independently Yes       Intervention Provide education and demonstration on how to check pulse in carotid and radial arteries.;Review the  importance of being able to check your own pulse for safety during independent exercise       Expected Outcomes Short Term: Able to explain why pulse checking is important during independent exercise;Long Term: Able to check pulse independently and accurately       Understanding of Exercise Prescription Yes       Intervention Provide education, explanation, and written materials on patient's individual exercise prescription       Expected Outcomes Short Term: Able to explain program exercise prescription;Long Term: Able to explain home exercise prescription to exercise independently          Exercise Goals Re-Evaluation :   Discharge Exercise Prescription (Final Exercise Prescription Changes):  Exercise Prescription Changes - 05/13/24 1400       Response to Exercise   Blood Pressure (Admit) 120/70    Blood Pressure (Exercise) 136/70    Blood Pressure (Exit) 122/60    Heart Rate (Admit) 72 bpm    Heart Rate (Exercise) 86 bpm    Heart Rate (Exit) 73 bpm    Oxygen Saturation (Admit) 96 %    Oxygen Saturation (Exercise) 96 %    Oxygen Saturation (Exit) 96 %    Rating of Perceived Exertion (Exercise) 11    Perceived Dyspnea (Exercise) 0          Nutrition:  Target Goals: Understanding of nutrition guidelines, daily intake of sodium 1500mg , cholesterol 200mg , calories 30% from fat and 7% or less from saturated fats, daily to have 5 or more servings of fruits and vegetables.  Education: Nutrition 1 -Group instruction provided by verbal, written material, interactive activities, discussions, models, and posters to present general guidelines for heart healthy nutrition including macronutrients, label reading, and promoting whole foods over processed counterparts. Education serves as pensions consultant of discussion of heart healthy eating for all. Written material provided at class time.    Education: Nutrition 2 -Group instruction provided by verbal, written material, interactive  activities, discussions, models, and posters to present general guidelines for heart healthy nutrition including sodium, cholesterol, and saturated fat. Providing guidance of habit forming to improve blood pressure, cholesterol, and body weight. Written material provided at class time.     Biometrics:  Pre Biometrics - 05/13/24 1444       Pre Biometrics   Height 5' 3 (1.6 m)    Weight 51.8 kg    Waist Circumference 28 inches    Hip Circumference 35 inches    Waist to Hip Ratio 0.8 %    BMI (Calculated) 20.23    Grip Strength 20.7 kg    Single Leg Stand 9.29 seconds           Nutrition Therapy Plan and Nutrition Goals:   Nutrition Assessments:  MEDIFICTS Score Key: >=70 Need to make dietary changes  40-70 Heart Healthy Diet <= 40 Therapeutic Level Cholesterol Diet  Flowsheet Row CARDIAC VIRTUAL BASED CARE from 05/08/2024 in [facility]  Picture Your Plate Total Score on Admission 52   Picture Your Plate Scores: <59 Unhealthy dietary pattern with much room for improvement. 41-50 Dietary pattern unlikely to meet recommendations for good health and room for improvement. 51-60 More healthful dietary pattern, with some room for improvement.  >60 Healthy  dietary pattern, although there may be some specific behaviors that could be improved.    Nutrition Goals Re-Evaluation:   Nutrition Goals Discharge (Final Nutrition Goals Re-Evaluation):   Psychosocial: Target Goals: Acknowledge presence or absence of significant depression and/or stress, maximize coping skills, provide positive support system. Participant is able to verbalize types and ability to use techniques and skills needed for reducing stress and depression.   Education: Stress, Anxiety, and Depression - Group verbal and visual presentation to define topics covered.  Reviews how body is impacted by stress, anxiety, and depression.  Also discusses healthy ways to reduce stress and to  treat/manage anxiety and depression. Written material provided at class time.   Education: Sleep Hygiene -Provides group verbal and written instruction about how sleep can affect your health.  Define sleep hygiene, discuss sleep cycles and impact of sleep habits. Review good sleep hygiene tips.   Initial Review & Psychosocial Screening:  Initial Psych Review & Screening - 05/08/24 1059       Initial Review   Current issues with None Identified      Family Dynamics   Good Support System? Yes    Comments Patient son, daugher and boy friend support her.      Barriers   Psychosocial barriers to participate in program The patient should benefit from training in stress management and relaxation.;There are no identifiable barriers or psychosocial needs.      Screening Interventions   Interventions To provide support and resources with identified psychosocial needs;Encouraged to exercise;Provide feedback about the scores to participant    Expected Outcomes Short Term goal: Utilizing psychosocial counselor, staff and physician to assist with identification of specific Stressors or current issues interfering with healing process. Setting desired goal for each stressor or current issue identified.;Long Term Goal: Stressors or current issues are controlled or eliminated.;Short Term goal: Identification and review with participant of any Quality of Life or Depression concerns found by scoring the questionnaire.;Long Term goal: The participant improves quality of Life and PHQ9 Scores as seen by post scores and/or verbalization of changes          Quality of Life Scores:   Quality of Life - 05/08/24 1058       Quality of Life   Select Quality of Life      Quality of Life Scores   Health/Function Pre 22.73 %    Socioeconomic Pre 25.94 %    Psych/Spiritual Pre 25.36 %    Family Pre 30 %    GLOBAL Pre 25.03 %         Scores of 19 and below usually indicate a poorer quality of life in  these areas.  A difference of  2-3 points is a clinically meaningful difference.  A difference of 2-3 points in the total score of the Quality of Life Index has been associated with significant improvement in overall quality of life, self-image, physical symptoms, and general health in studies assessing change in quality of life.  PHQ-9: Review Flowsheet       05/13/2024  Depression screen PHQ 2/9  Decreased Interest 0  Down, Depressed, Hopeless 0  PHQ - 2 Score 0  Altered sleeping 0  Tired, decreased energy 2  Change in appetite 2  Feeling bad or failure about yourself  0  Trouble concentrating 0  Moving slowly or fidgety/restless 0  Suicidal thoughts 0  PHQ-9 Score 4   Difficult doing work/chores Not difficult at all    Details  Data saved with a previous flowsheet row definition        Interpretation of Total Score  Total Score Depression Severity:  1-4 = Minimal depression, 5-9 = Mild depression, 10-14 = Moderate depression, 15-19 = Moderately severe depression, 20-27 = Severe depression   Psychosocial Evaluation and Intervention:  Psychosocial Evaluation - 05/08/24 1059       Psychosocial Evaluation & Interventions   Interventions Relaxation education;Encouraged to exercise with the program and follow exercise prescription;Stress management education    Comments Patient was referred to cardiac rehab with stent placement 04/23/24. She denies any depression, anxiety, or stressors currently other than having the stent procedure. She has a significant family history of heart disease and wants to learn how to change her lifestyle to be as healthy as she can.  She says she sleeps well at night. She has a $40 co-payment but says this will not be a barrier for her to participate in the program. Her goals are as stated above and to learn what she is able to do. She has no barriers identified to complete the program.    Expected Outcomes Short Term: Patient will start the  program and attend consistently. Long Term: Patient will complete the program meeting personal goals.    Continue Psychosocial Services  Follow up required by staff          Psychosocial Re-Evaluation:   Psychosocial Discharge (Final Psychosocial Re-Evaluation):   Vocational Rehabilitation: Provide vocational rehab assistance to qualifying candidates.   Vocational Rehab Evaluation & Intervention:  Vocational Rehab - 05/08/24 1058       Initial Vocational Rehab Evaluation & Intervention   Assessment shows need for Vocational Rehabilitation No      Vocational Rehab Re-Evaulation   Comments Patient is retired.          Education: Education Goals: Education classes will be provided on a variety of topics geared toward better understanding of heart health and risk factor modification. Participant will state understanding/return demonstration of topics presented as noted by education test scores.  Learning Barriers/Preferences:  Learning Barriers/Preferences - 05/08/24 1056       Learning Barriers/Preferences   Learning Barriers None    Learning Preferences Skilled Demonstration          General Cardiac Education Topics:  AED/CPR: - Group verbal and written instruction with the use of models to demonstrate the basic use of the AED with the basic ABC's of resuscitation.   Test and Procedures: - Group verbal and visual presentation and models provide information about basic cardiac anatomy and function. Reviews the testing methods done to diagnose heart disease and the outcomes of the test results. Describes the treatment choices: Medical Management, Angioplasty, or Coronary Bypass Surgery for treating various heart conditions including Myocardial Infarction, Angina, Valve Disease, and Cardiac Arrhythmias. Written material provided at class time. Flowsheet Row CARDIAC REHAB PHASE II EXERCISE from 05/27/2024 in [facility]  Date 05/27/24  Educator  [facility]  Instruction Review Code 1- Verbalizes Understanding    Medication Safety: - Group verbal and visual instruction to review commonly prescribed medications for heart and lung disease. Reviews the medication, class of the drug, and side effects. Includes the steps to properly store meds and maintain the prescription regimen. Written material provided at class time.   Intimacy: - Group verbal instruction through game format to discuss how heart and lung disease can affect sexual intimacy. Written material provided at class time.   Know Your Numbers and Heart Failure: -  Group verbal and visual instruction to discuss disease risk factors for cardiac and pulmonary disease and treatment options.  Reviews associated critical values for Overweight/Obesity, Hypertension, Cholesterol, and Diabetes.  Discusses basics of heart failure: signs/symptoms and treatments.  Introduces Heart Failure Zone chart for action plan for heart failure. Written material provided at class time.   Infection Prevention: - Provides verbal and written material to individual with discussion of infection control including proper hand washing and proper equipment cleaning during exercise session.   Falls Prevention: - Provides verbal and written material to individual with discussion of falls prevention and safety.   Other: -Provides group and verbal instruction on various topics (see comments)   Knowledge Questionnaire Score:  Knowledge Questionnaire Score - 05/08/24 1056       Knowledge Questionnaire Score   Pre Score 25/26          Core Components/Risk Factors/Patient Goals at Admission:  Personal Goals and Risk Factors at Admission - 05/08/24 1058       Core Components/Risk Factors/Patient Goals on Admission    Weight Management Weight Maintenance    Lipids Yes    Intervention Provide education and support for participant on nutrition & aerobic/resistive exercise along with prescribed medications to  achieve LDL 70mg , HDL >40mg .    Expected Outcomes Short Term: Participant states understanding of desired cholesterol values and is compliant with medications prescribed. Participant is following exercise prescription and nutrition guidelines.;Long Term: Cholesterol controlled with medications as prescribed, with individualized exercise RX and with personalized nutrition plan. Value goals: LDL < 70mg , HDL > 40 mg.          Education:Diabetes - Individual verbal and written instruction to review signs/symptoms of diabetes, desired ranges of glucose level fasting, after meals and with exercise. Acknowledge that pre and post exercise glucose checks will be done for 3 sessions at entry of program.   Core Components/Risk Factors/Patient Goals Review:    Core Components/Risk Factors/Patient Goals at Discharge (Final Review):    ITP Comments:  ITP Comments     Row Name 05/08/24 1108 05/13/24 1423 05/18/24 1107 05/20/24 1333 06/17/24 0813   ITP Comments Virtual orientation visit completed for cardiac rehab with stent placement. On-site orientation visit scheduled for 05/13/24. Patient arrived for 1st visit/orientation/education at 1300. Patient was referred to CR by Dr. Soyla Merck attending/Referred by Dr. Shelda Bruckner due to Coronary artery Stent placement. During orientation advised patient on arrival and appointment times what to wear, what to do before, during and after exercise. Reviewed attendance and class policy.  Pt is scheduled to return Cardiac Rehab on 05/18/24 at 1100. Pt was advised to come to class 15 minutes before class starts.  Discussed RPE/Dpysnea scales. Patient participated in warm up stretches. Patient was able to complete 6 minute walk test.  Telemetry:NSR. Patient was measured for the equipment. Discussed equipment safety with patient. Took patient pre-anthropometric measurements. Patient finished visit at 1423. First full day of exercise!  Patient was oriented to  gym and equipment including functions, settings, policies, and procedures.  Patient's individual exercise prescription and treatment plan were reviewed.  All starting workloads were established based on the results of the 6 minute walk test done at initial orientation visit.  The plan for exercise progression was also introduced and progression will be customized based on patient's performance and goals. 30 day review completed. ITP sent to Dr. Dorn Ross, Medical Director of Cardiac Rehab. Continue with ITP unless changes are made by physician. 30 day review completed. ITP  sent to Dr. Dorn Ross, Medical Director of Cardiac Rehab. Continue with ITP unless changes are made by physician. Currently no goals due to attendance.      Comments: Discharge ITP.

## 2024-06-29 NOTE — Progress Notes (Signed)
 Discharge Progress Report  Patient Details  Name: Monica Spence MRN: [id number] Date of Birth: 18-Aug-1955 Referring Provider:   Flowsheet Row CARDIAC REHAB PHASE II ORIENTATION from 05/13/2024 in [facility] CARDIAC REHABILITATION  Referring Provider Shelda Bruckner MD     Number of Visits: 4  Reason for Discharge:  Early Exit:  Lack of attendance  Smoking History:  Social History   Tobacco Use  Smoking Status Former  Smokeless Tobacco Never    Diagnosis:  No diagnosis found.  ADL [facility]SD:   Initial Exercise Prescription:  Initial Exercise Prescription - 05/13/24 1400       Date of Initial Exercise RX and Referring Provider   Date 05/13/24    Referring Provider Shelda Bruckner MD      Treadmill   MPH 1.2    Grade 0    Minutes 15    METs 1.92      REL-XR   Level 1    Speed 50    Minutes 15    METs 1.9      Prescription Details   Frequency (times per week) 3    Duration Progress to 30 minutes of continuous aerobic without signs/symptoms of physical distress      Intensity   THRR 40-80% of Max Heartrate 104-136    Ratings of Perceived Exertion 11-13    Perceived Dyspnea 0-4      Resistance Training   Training Prescription Yes    Weight 3    Reps 10-15          Discharge Exercise Prescription (Final Exercise Prescription Changes):  Exercise Prescription Changes - 05/13/24 1400       Response to Exercise   Blood Pressure (Admit) 120/70    Blood Pressure (Exercise) 136/70    Blood Pressure (Exit) 122/60    Heart Rate (Admit) 72 bpm    Heart Rate (Exercise) 86 bpm    Heart Rate (Exit) 73 bpm    Oxygen Saturation (Admit) 96 %    Oxygen Saturation (Exercise) 96 %    Oxygen Saturation (Exit) 96 %    Rating of Perceived Exertion (Exercise) 11    Perceived Dyspnea (Exercise) 0          Functional Capacity:  6 Minute Walk     Row Name 05/13/24 1431         6 Minute Walk   Phase Initial     Distance 1200 feet     Walk  Time 6 minutes     # of Rest Breaks 0     MPH 2.27     METS 3.02     RPE 11     Perceived Dyspnea  0     VO2 Peak 10.6     Symptoms No     Resting HR 72 bpm     Resting BP 120/70     Resting Oxygen Saturation  96 %     Exercise Oxygen Saturation  during 6 min walk 96 %     Max Ex. HR 86 bpm     Max Ex. BP 136/70     2 Minute Post BP 122/60        Psychological, QOL, Others - Outcomes: PHQ 2/9:    05/13/2024    2:16 PM  Depression screen PHQ 2/9  Decreased Interest 0  Down, Depressed, Hopeless 0  PHQ - 2 Score 0  Altered sleeping 0  Tired, decreased energy 2  Change in appetite 2  Feeling bad  or failure about yourself  0  Trouble concentrating 0  Moving slowly or fidgety/restless 0  Suicidal thoughts 0  PHQ-9 Score 4   Difficult doing work/chores Not difficult at all     Data saved with a previous flowsheet row definition    Quality of Life:  Quality of Life - 05/08/24 1058       Quality of Life   Select Quality of Life      Quality of Life Scores   Health/Function Pre 22.73 %    Socioeconomic Pre 25.94 %    Psych/Spiritual Pre 25.36 %    Family Pre 30 %    GLOBAL Pre 25.03 %          Personal Goals: Goals established at orientation with interventions provided to work toward goal.  Personal Goals and Risk Factors at Admission - 05/08/24 1058       Core Components/Risk Factors/Patient Goals on Admission    Weight Management Weight Maintenance    Lipids Yes    Intervention Provide education and support for participant on nutrition & aerobic/resistive exercise along with prescribed medications to achieve LDL 70mg , HDL >40mg .    Expected Outcomes Short Term: Participant states understanding of desired cholesterol values and is compliant with medications prescribed. Participant is following exercise prescription and nutrition guidelines.;Long Term: Cholesterol controlled with medications as prescribed, with individualized exercise RX and with personalized  nutrition plan. Value goals: LDL < 70mg , HDL > 40 mg.           Personal Goals Discharge:   Exercise Goals and Review:  Exercise Goals     Row Name 05/13/24 1502             Exercise Goals   Increase Physical Activity Yes       Intervention Provide advice, education, support and counseling about physical activity/exercise needs.;Develop an individualized exercise prescription for aerobic and resistive training based on initial evaluation findings, risk stratification, comorbidities and participant's personal goals.       Expected Outcomes Short Term: Attend rehab on a regular basis to increase amount of physical activity.;Long Term: Add in home exercise to make exercise part of routine and to increase amount of physical activity.;Long Term: Exercising regularly at least 3-5 days a week.       Increase Strength and Stamina Yes       Intervention Provide advice, education, support and counseling about physical activity/exercise needs.;Develop an individualized exercise prescription for aerobic and resistive training based on initial evaluation findings, risk stratification, comorbidities and participant's personal goals.       Expected Outcomes Short Term: Increase workloads from initial exercise prescription for resistance, speed, and METs.;Short Term: Perform resistance training exercises routinely during rehab and add in resistance training at home;Long Term: Improve cardiorespiratory fitness, muscular endurance and strength as measured by increased METs and functional capacity ( )       Able to understand and use rate of perceived exertion (RPE) scale Yes       Intervention Provide education and explanation on how to use RPE scale       Expected Outcomes Short Term: Able to use RPE daily in rehab to express subjective intensity level;Long Term:  Able to use RPE to guide intensity level when exercising independently       Able to understand and use Dyspnea scale Yes        Intervention Provide education and explanation on how to use Dyspnea scale       Expected  Outcomes Short Term: Able to use Dyspnea scale daily in rehab to express subjective sense of shortness of breath during exertion;Long Term: Able to use Dyspnea scale to guide intensity level when exercising independently       Knowledge and understanding of Target Heart Rate Range (THRR) Yes       Intervention Provide education and explanation of THRR including how the numbers were predicted and where they are located for reference       Expected Outcomes Short Term: Able to state/look up THRR;Long Term: Able to use THRR to govern intensity when exercising independently;Short Term: Able to use daily as guideline for intensity in rehab       Able to check pulse independently Yes       Intervention Provide education and demonstration on how to check pulse in carotid and radial arteries.;Review the importance of being able to check your own pulse for safety during independent exercise       Expected Outcomes Short Term: Able to explain why pulse checking is important during independent exercise;Long Term: Able to check pulse independently and accurately       Understanding of Exercise Prescription Yes       Intervention Provide education, explanation, and written materials on patient's individual exercise prescription       Expected Outcomes Short Term: Able to explain program exercise prescription;Long Term: Able to explain home exercise prescription to exercise independently          Exercise Goals Re-Evaluation:   Nutrition & Weight - Outcomes:  Pre Biometrics - 05/13/24 1444       Pre Biometrics   Height 5' 3 (1.6 m)    Weight 51.8 kg    Waist Circumference 28 inches    Hip Circumference 35 inches    Waist to Hip Ratio 0.8 %    BMI (Calculated) 20.23    Grip Strength 20.7 kg    Single Leg Stand 9.29 seconds           Nutrition:   Nutrition Discharge:   Education Questionnaire Score:   Knowledge Questionnaire Score - 05/08/24 1056       Knowledge Questionnaire Score   Pre Score 25/26          Goals reviewed with patient; copy given to patient.

## 2024-07-01 ENCOUNTER — Encounter (HOSPITAL_COMMUNITY)

## 2024-07-02 DIAGNOSIS — H5203 Hypermetropia, bilateral: Secondary | ICD-10-CM | POA: Diagnosis not present

## 2024-07-03 ENCOUNTER — Encounter (HOSPITAL_COMMUNITY)

## 2024-07-06 ENCOUNTER — Encounter (HOSPITAL_COMMUNITY)

## 2024-07-08 ENCOUNTER — Encounter (HOSPITAL_COMMUNITY)

## 2024-07-13 ENCOUNTER — Encounter (HOSPITAL_COMMUNITY)

## 2024-07-15 ENCOUNTER — Encounter (HOSPITAL_COMMUNITY)

## 2024-07-17 ENCOUNTER — Encounter (HOSPITAL_COMMUNITY)

## 2024-07-20 ENCOUNTER — Encounter (HOSPITAL_COMMUNITY)

## 2024-07-22 ENCOUNTER — Encounter (HOSPITAL_COMMUNITY)

## 2024-07-24 ENCOUNTER — Encounter (HOSPITAL_COMMUNITY)

## 2024-07-26 NOTE — Progress Notes (Unsigned)
 Cardiology Office Note    Date:  07/29/2024  ID:  Jyoti Harju, DOB 09-29-1955, MRN [id number] PCP:  Shona Norleen PEDLAR, MD  Cardiologist:  Shelda Bruckner, MD  Electrophysiologist:  None   Chief Complaint: Follow up for CAD   History of Present Illness: .   Javonda Suh is a 68 y.o. female with visit-pertinent history of CAD, prior tobacco use.   On 9/10 patient was initially seen at [facility] for chest pain and negative troponins.  EKG without any acute ST-T wave changes.  She was transferred to [facility] for evaluation of chest pain/presumed unstable angina.  Patient evaluated by Dr. Bruckner in the ED on 04/23/2024.  It was noted the time that patient had no prior cardiac history, noted to have been a smoker for 15+ years but had quit smoking 4 to 5 years prior.  Patient had been reportedly having intermittent chest pain and had a coronary calcification score of 634 and was started on atorvastatin  80 mg daily.   Patient reported that she was previously very active and almost went on daily walks with her dog in the neighborhood however for the prior 2 weeks he been noting increased shortness of breath with activity also reporting exertional chest pain with radiation into her shoulders.  Troponins were negative x 2.  Coronary CTA indicated coronary calcium  score of 555, this was 94th percentile for age, sex and race matched control.  Patient noted to have severe stenosis with low attenuation and vulnerable plaque, cardiac catheterization was recommended, CT FFR analysis showed evidence of significant functional stenosis in mid distal RCA.  Cardiac catheterization on 04/24/2024 showed high-grade mid right coronary lesion treated with a 3.0 x 32 mm Synergy XD stent postdilated to 3.25 mm, mild LAD disease.  DAPT with aspirin  and Brilinta  for 1 year followed by Brilinta  monotherapy indefinitely was recommended.  Patient was discharged on 04/25/2024 in stable condition.    Patient was last in the clinic on 05/05/2024 for follow-up.  She reported that she been doing well.  She denied any further chest or arm pain.  Patient was evaluated in the ED in 06/2024 in setting of neck pain.  Workup was reassuring including negative troponins x 2.  This was felt to be musculoskeletal pain.  Today she presents for follow-up.  She reports that she has been doing well overall.  She denies any further chest pain, arm pain or neck pain.  She does note ongoing problems with back pain that she is no longer able to take her meloxicam, recommended following up with her spinal doc.  She denies any palpitations, lower extremity edema, orthopnea or PND.  She denies any presyncope or syncope.  She reports some mild dyspnea on exertion when walking her dog, questions if related to COPD. Patient notes that she has been staying busy as her fianc has been diagnosed with stage IV pancreatic cancer, as such was not able to routinely attend cardiac rehab and was discharged. ROS: .   Today she denies chest pain, lower extremity edema, fatigue, palpitations, melena, hematuria, hemoptysis, diaphoresis, weakness, presyncope, syncope, orthopnea, and PND.  All other systems are reviewed and otherwise negative. Studies Reviewed: SABRA   EKG:  EKG is not ordered today.  CV Studies: Cardiac studies reviewed are outlined and summarized above. Otherwise please see EMR for full report. Cardiac Studies & Procedures   ______________________________________________________________________________________________ CARDIAC CATHETERIZATION  CARDIAC CATHETERIZATION 04/24/2024  Conclusion   Mid RCA to Dist RCA lesion  is 90% stenosed.   Prox LAD to Mid LAD lesion is 40% stenosed.   A stent was successfully placed.   Post intervention, there is a 0% residual stenosis.  1.  High-grade mid right coronary lesion treated with a 3.0 x 32 mm Synergy XD stent postdilated to 3.25 mm. 2.  Mild LAD disease. 3.  LVEDP of 9  mmHg  Recommendation: Dual antiplatelet therapy with aspirin  and Brilinta  for 1 year then Brilinta  monotherapy indefinitely.  Findings Coronary Findings Diagnostic  Dominance: Right  Left Anterior Descending The vessel exhibits minimal luminal irregularities. Prox LAD to Mid LAD lesion is 40% stenosed.  Right Coronary Artery There is mild diffuse disease throughout the vessel. Mid RCA to Dist RCA lesion is 90% stenosed.  Intervention  Mid RCA to Dist RCA lesion Stent A stent was successfully placed. Post-Intervention Lesion Assessment The intervention was successful. Pre-interventional TIMI flow is 3. Post-intervention TIMI flow is 3. There is a 0% residual stenosis post intervention.     ECHOCARDIOGRAM  ECHOCARDIOGRAM COMPLETE 04/24/2024  Narrative ECHOCARDIOGRAM REPORT    Patient Name:   CHIARA COLTRIN Date of Exam: 04/24/2024 Medical Rec #:  [id number]        Height:       63.0 in Accession #:    [id number]       Weight:       113.0 lb Date of Birth:  Nov 13, 1955        BSA:          1.517 m Patient Age:    68 years         BP:           145/118 mmHg Patient Gender: F                HR:           59 bpm. Exam Location:  Inpatient  Procedure: 2D Echo (Both Spectral and Color Flow Doppler were utilized during procedure).  Indications:    Chest pain  History:        Patient has no prior history of Echocardiogram examinations. Signs/Symptoms:Chest Pain.  Sonographer:    Charmaine Gaskins Referring Phys: [id number] SHENG L HALEY  IMPRESSIONS   1. Left ventricular ejection fraction, by estimation, is 65 to 70%. The left ventricle has normal function. The left ventricle has no regional wall motion abnormalities. Left ventricular diastolic parameters are consistent with Grade I diastolic dysfunction (impaired relaxation). 2. Right ventricular systolic function is normal. The right ventricular size is normal. Tricuspid regurgitation signal is inadequate for assessing PA  pressure. 3. The mitral valve is normal in structure. Trivial mitral valve regurgitation. No evidence of mitral stenosis. 4. The aortic valve was not well visualized. Aortic valve regurgitation is not visualized. No aortic stenosis is present. 5. The inferior vena cava is normal in size with greater than 50% respiratory variability, suggesting right atrial pressure of 3 mmHg. 6. Cannot exclude a small PFO.  FINDINGS Left Ventricle: Left ventricular ejection fraction, by estimation, is 65 to 70%. The left ventricle has normal function. The left ventricle has no regional wall motion abnormalities. The left ventricular internal cavity size was normal in size. There is no left ventricular hypertrophy. Left ventricular diastolic parameters are consistent with Grade I diastolic dysfunction (impaired relaxation).  Right Ventricle: The right ventricular size is normal. No increase in right ventricular wall thickness. Right ventricular systolic function is normal. Tricuspid regurgitation signal is inadequate for assessing PA pressure.  Left Atrium:  Left atrial size was normal in size.  Right Atrium: Right atrial size was normal in size.  Pericardium: There is no evidence of pericardial effusion.  Mitral Valve: The mitral valve is normal in structure. Trivial mitral valve regurgitation. No evidence of mitral valve stenosis.  Tricuspid Valve: The tricuspid valve is normal in structure. Tricuspid valve regurgitation is trivial.  Aortic Valve: The aortic valve was not well visualized. Aortic valve regurgitation is not visualized. No aortic stenosis is present.  Pulmonic Valve: The pulmonic valve was not well visualized. Pulmonic valve regurgitation is not visualized.  Aorta: The aortic root and ascending aorta are structurally normal, with no evidence of dilitation.  Venous: The inferior vena cava is normal in size with greater than 50% respiratory variability, suggesting right atrial pressure of 3  mmHg.  IAS/Shunts: Cannot exclude a small PFO.   LEFT VENTRICLE PLAX 2D LVIDd:         4.00 cm     Diastology LVIDs:         2.10 cm     LV e' medial:    6.74 cm/s LV PW:         0.70 cm     LV E/e' medial:  11.2 LV IVS:        0.70 cm     LV e' lateral:   8.27 cm/s LVOT diam:     1.90 cm     LV E/e' lateral: 9.1 LVOT Area:     2.84 cm  LV Volumes (MOD) LV vol d, MOD A2C: 60.7 ml LV vol d, MOD A4C: 48.4 ml LV vol s, MOD A2C: 16.5 ml LV vol s, MOD A4C: 15.9 ml LV SV MOD A2C:     44.2 ml LV SV MOD A4C:     48.4 ml LV SV MOD BP:      38.2 ml  RIGHT VENTRICLE RV Basal diam:  3.00 cm RV Mid diam:    2.80 cm RV S prime:     13.40 cm/s  LEFT ATRIUM             Index        RIGHT ATRIUM          Index LA diam:        2.20 cm 1.45 cm/m   RA Area:     9.69 cm LA Vol (A2C):   18.4 ml 12.13 ml/m  RA Volume:   20.00 ml 13.18 ml/m LA Vol (A4C):   28.7 ml 18.92 ml/m LA Biplane Vol: 24.3 ml 16.02 ml/m  AORTA Ao Root diam: 3.10 cm Ao Asc diam:  2.50 cm  MITRAL VALVE MV Area (PHT): 4.15 cm    SHUNTS MV Decel Time: 183 msec    Systemic Diam: 1.90 cm MV E velocity: 75.50 cm/s MV A velocity: 94.30 cm/s MV E/A ratio:  0.80  Lonni Nanas MD Electronically signed by Lonni Nanas MD Signature Date/Time: 04/24/2024/10:44:26 AM    Final      CT SCANS  CT CORONARY MORPH W/CTA COR W/SCORE 04/24/2024  Addendum 04/24/2024 12:07 PM ADDENDUM REPORT: 04/24/2024 12:04  EXAM: OVER-READ INTERPRETATION CT CHEST  The following report is an over-read performed by radiologist Dr. Rogelia Myers of [facility] on 04/24/2024. This over-read does not include interpretation of cardiac or coronary anatomy or pathology. The coronary CTA interpretation by the cardiologist is attached.  COMPARISON:  04/17/2024, 12/10/2017  FINDINGS: Pulmonary Embolism: While the exam was not optimized for the evaluation of the pulmonary  arteries, no central pulmonary  embolism visualized.  Cardiovascular: Normal appearance of extracardiac vascular structures. No pericardial effusion. No aortic aneurysm.  Mediastinum/Nodes: No mediastinal mass. No mediastinal or hilar lymphadenopathy. Normal esophagus.  Lungs/Pleura: The midline trachea and bronchi are patent. Redemonstrated reticulation and scarring in the right middle lobe and lingula. Superimposed tree-in-bud nodularity in the lingula and right middle lobe is similar to the most recent comparison, but felt to be more conspicuous than on the chest CT from since 2019. No lobar consolidation, pleural effusion, or pneumothorax.  Musculoskeletal: No acute fracture or destructive bone lesion. Multilevel thoracic osteophytosis.  Upper Abdomen: No acute abnormality in the partially visualized upper abdomen.  IMPRESSION: Redemonstrated reticulation and scarring in the right middle lobe and lingula with superimposed tree-in-bud nodularity. While the tree-in-bud nodularity is similar to the most recent comparison, it is felt to be more conspicuous than on the chest CT performed in 2019. If there is a history of chronic cough, this may reflect changes of an atypical infection, such as mycobacterium avium complex. Laboratory correlation recommended.   Electronically Signed By: Rogelia Myers M.D. On: 04/24/2024 12:04  Narrative CLINICAL DATA:  68 Year-old Female  EXAM: Cardiac/Coronary  CTA  TECHNIQUE: Axial non-contrast 3 mm slices were carried out through the heart. The data set was analyzed on a dedicated work station and scored using the Agatson method. Gantry rotation speed was 250 msecs and collimation was .6 mm. 0.8 mg of sl NTG was given. The 3D data set was reconstructed in 5% intervals of the 35-75% of the R-R cycle. The patient received 100 cc of contrast.  FINDINGS: Coronary Arteries:  Normal coronary origin.  Right dominance.  Coronary Calcium  Score:  Left main: 0  Left  anterior descending artery: 292  Left circumflex artery: 118  Right coronary artery: 145  Total: 555  Percentile: 94th for age, sex, and race matched control.  Left main: The left main is a large caliber vessel with a normal take off from the left coronary cusp that bifurcates to form a left anterior descending artery and a left circumflex artery. There is no significant plaque.  Left anterior descending artery: The LAD is a large caliber vessel with one large diagonal vessel with multiple branches. Mild non-obstructive mixed plaques (25-49%) in proximal LAD. Moderate calcified stenosis (50-70%) in the mid LAD prior to a mild soft plaque stenosis (vulnerable plaque). Mild non-obstructive mixed plaques (25-49%) throughout the D1.  Left circumflex artery: The LCX is non-dominant with one obtuse marginal. Minimal non-obstructive calcified plaques (1-24%) in the proximal LCX, mid LCX, and proximal OM1.  Right coronary artery: The RCA is dominant with normal take off from the right coronary cusp. the RCA terminates as a PDA and right posterolateral branch. Mild mixed plaque in the mid RCA superior to a severe soft plaque stenosis (70-99%). There is low attenuation plaque (vulnerable plaque).  Other non-coronary findings:  Right Atrium: Right atrial size is within normal limits.  Right Ventricle: The right ventricular cavity is within normal limits.  Left Atrium: Left atrial size is normal in size with no left atrial appendage filling defect.  Left Ventricle: The ventricular cavity size is within normal limits.  Interatrial septum: Small PFO.  Main Pulmonary Artery: Normal size of the pulmonary artery.  Pulmonary veins: Normal pulmonary venous drainage.  Systemic veins: Normal venous anatomy.  Pericardium: Normal thickness without significant effusion or calcium  present.  Cardiac valves: The aortic valve is tri-leaflet without significant calcification. The mitral  valve  is normal without significant calcification and mild thickening.  Aorta: Normal caliber without significant calcifications.  Extra-cardiac findings: See attached radiology report for non-cardiac structures.  Artifact: Slab  Image quality: Fair  IMPRESSION: 1. Coronary calcium  score of 555. This was 94th percentile for age, sex, and race matched control.  2. Normal coronary origin with right dominance.  3. CAD-RADS 4a Severe stenosis with low attenuation and vulnerable plaque (V1) . (70-99%). Cardiac catheterization or CT FFR is recommended. Consider symptom-guided anti-ischemic pharmacotherapy as well as risk factor modification per guideline directed care.  RECOMMENDATIONS: RECOMMENDATIONS The proposed cut-off value of 1,651 AU yielded a 93 % sensitivity and 75 % specificity in grading AS severity in patients with classical low-flow, low-gradient AS. Proposed different cut-off values to define severe AS for men and women as 2,065 AU and 1,274 AU, respectively. The joint European and American recommendations for the assessment of AS consider the aortic valve calcium  score as a continuum - a very high calcium  score suggests severe AS and a low calcium  score suggests severe AS is unlikely.  Donney VEAR Jarome LULLA Stephen RENETTE, et al. 2017 ESC/EACTS Guidelines for the management of valvular heart disease. Eur Heart J 2017;38:2739-91.  Coronary artery calcium  (CAC) score is a strong predictor of incident coronary heart disease (CHD) and provides predictive information beyond traditional risk factors. CAC scoring is reasonable to use in the decision to withhold, postpone, or initiate statin therapy in intermediate-risk or selected borderline-risk asymptomatic adults (age 19-75 years and LDL-C >=70 to <190 mg/dL) who do not have diabetes or established atherosclerotic cardiovascular disease (ASCVD).* In intermediate-risk (10-year ASCVD risk >=7.5% to <20%) adults or selected  borderline-risk (10-year ASCVD risk >=5% to <7.5%) adults in whom a CAC score is measured for the purpose of making a treatment decision the following recommendations have been made:  If CAC = 0, it is reasonable to withhold statin therapy and reassess in 5 to 10 years, as long as higher risk conditions are absent (diabetes mellitus, family history of premature CHD in first degree relatives (males <55 years; females <65 years), cigarette smoking, LDL >=190 mg/dL or other independent risk factors).  If CAC is 1 to 99, it is reasonable to initiate statin therapy for patients >=31 years of age.  If CAC is >=100 or >=75th percentile, it is reasonable to initiate statin therapy at any age.  Cardiology referral should be considered for patients with CAC scores =400 or >=75th percentile.  *2018 AHA/ACC/AACVPR/AAPA/ABC/ACPM/ADA/AGS/APhA/ASPC/NLA/PCNA Guideline on the Management of Blood Cholesterol: A Report of the American College of Cardiology/American Heart Association Task Force on Clinical Practice Guidelines. J Am Coll Cardiol. 2019;73(24):3168-3209.  Electronically Signed: By: Stanly Leavens M.D. On: 04/24/2024 11:47   CT SCANS  CT CARDIAC SCORING (SELF PAY ONLY) 04/17/2024  Addendum 04/23/2024  9:52 AM ADDENDUM REPORT: 04/23/2024 09:49  EXAM: OVER-READ INTERPRETATION  CT CHEST  The following report is an over-read performed by radiologist Dr. Ree Levy [facility] on 04/23/2024. This over-read does not include interpretation of cardiac or coronary anatomy or pathology. The coronary calcium  score interpretation by the cardiologist is attached.  COMPARISON:  CT scan chest from 12/10/2017.  FINDINGS: Mediastinum/Nodes: No solid / cystic mediastinal masses. The visualized esophagus is nondistended precluding optimal assessment. No thoracic lymphadenopathy by size criteria.  Lungs/Pleura: Imaged tracheo-bronchial tree is patent. There is small  grouping of tree-in-bud configuration nodules and associated linear areas of atelectasis/scarring in the inferior lingular segment of left upper lobe and similar characteristic smaller  area in the middle lobe. Findings are grossly unchanged since the prior study dating back to 2019 and may represent combination of background scarring with or without superimposed pneumonitis. Correlate clinically.  No new mass or consolidation. No pleural effusion or pneumothorax. No suspicious lung nodules.  Upper Abdomen: Visualized upper abdominal viscera within normal limits.  Musculoskeletal: The visualized soft tissues of the chest wall are grossly unremarkable. No suspicious osseous lesions.  IMPRESSION: 1. There is small grouping of tree-in-bud configuration nodules and associated linear areas of atelectasis/scarring in the inferior lingular segment of left upper lobe and similar characteristic smaller area in the middle lobe. Findings are grossly unchanged since the prior study dating back to 2019 and may represent combination of background scarring with or without superimposed pneumonitis. Correlate clinically. 2. No other acute or significant extracardiac findings.   Electronically Signed By: Ree Molt M.D. On: 04/23/2024 09:49  Narrative CLINICAL DATA:  Cardiovascular Disease Risk stratification  EXAM: Coronary Calcium  Score  TECHNIQUE: A gated, non-contrast computed tomography scan of the heart was performed using 2.5 mm slice thickness. Axial images were analyzed on a dedicated workstation. Calcium  scoring of the coronary arteries was performed using the Agatston method.  FINDINGS: Coronary Calcium  Score:  Left main: 0  Left anterior descending artery: 326  Left circumflex artery: 126  Right coronary artery: 182  Total: 634  Percentile: 95  Pericardium: Normal.  Non-cardiac: See separate report from [facility].  IMPRESSION: 1. Coronary  calcium  score of 634. This was 95 percentile for age-, race-, and sex-matched controls.  RECOMMENDATIONS: Coronary artery calcium  (CAC) score is a strong predictor of incident coronary heart disease (CHD) and provides predictive information beyond traditional risk factors. CAC scoring is reasonable to use in the decision to withhold, postpone, or initiate statin therapy in intermediate-risk or selected borderline-risk asymptomatic adults (age 27-75 years and LDL-C >=70 to <190 mg/dL) who do not have diabetes or established atherosclerotic cardiovascular disease (ASCVD).* In intermediate-risk (10-year ASCVD risk >=7.5% to <20%) adults or selected borderline-risk (10-year ASCVD risk >=5% to <7.5%) adults in whom a CAC score is measured for the purpose of making a treatment decision the following recommendations have been made:  If CAC=0, it is reasonable to withhold statin therapy and reassess in 5 to 10 years, as long as higher risk conditions are absent (diabetes mellitus, family history of premature CHD in first degree relatives (males <55 years; females <65 years), cigarette smoking, or LDL >=190 mg/dL).  If CAC is 1 to 99, it is reasonable to initiate statin therapy for patients >=34 years of age.  If CAC is >=100 or >=75th percentile, it is reasonable to initiate statin therapy at any age.  Cardiology referral should be considered for patients with CAC scores >=400 or >=75th percentile.  *2018 AHA/ACC/AACVPR/AAPA/ABC/ACPM/ADA/AGS/APhA/ASPC/NLA/PCNA Guideline on the Management of Blood Cholesterol: A Report of the American College of Cardiology/American Heart Association Task Force on Clinical Practice Guidelines. J Am Coll Cardiol. 2019;73(24):3168-3209.  Redell Shallow, MD  Electronically Signed: By: Redell Shallow M.D. On: 04/17/2024 08:21     ______________________________________________________________________________________________       Current Reported  Medications:.    Active Medications[1]  Physical Exam:    VS:  BP 108/66   Pulse 77   Ht 5' 4 (1.626 m)   Wt 114 lb (51.7 kg)   SpO2 98%   BMI 19.57 kg/m    Wt Readings from Last 3 Encounters:  07/29/24 114 lb (51.7 kg)  06/13/24 113 lb (51.3 kg)  05/13/24 114 lb 3.2 oz (51.8 kg)    GEN: Well nourished, well developed in no acute distress NECK: No JVD; No carotid bruits CARDIAC: RRR, no murmurs, rubs, gallops RESPIRATORY:  Clear to auscultation without rales, wheezing or rhonchi  ABDOMEN: Soft, non-tender, non-distended EXTREMITIES:  No edema; No acute deformity     Asessement and Plan:.    CAD: Patient presented initially with increasing exertional chest pain and dyspnea. High sensitive troponin was negative x 2. Echo showed LVEF of 65 to 70% with no RWMA and grade 1 diastolic dysfunction. Coronary CTA showed calcium  score of 555, 94th percentile for age and sex, severe stenosis with low attenuation of vulnerable plaque of mid RCA, FFR was positive. LHC confirmed 90% stenosis of mid to distal RCA and 40% stenosis of proximal to mid LAD. She underwent successful PCI with DES to mid RCA lesion. She was started on DAPT with aspirin  and Brilinta  with plans to continue for 1 year then switch to Brilinta  monotherapy. Stable with no anginal symptoms. No indication for ischemic evaluation.  Heart healthy diet and regular cardiovascular exercise encouraged.  Reviewed ED precautions. Continue aspirin  81 mg daily, Lipitor 80 mg daily and Brilinta  90 mg twice daily.  Hyperlipidemia: Last lipid profile during admission indicated total cholesterol 208, triglycerides 119, HDL 52 and LDL 132.  LDL goal less than 70 given CAD.  Patient started on atorvastatin  80 mg daily. Check fasting lipid profile and LFTs.    Disposition: F/u with Dr. Lonni 4-5 months.   Signed, Lennyn Bellanca D Samanth Mirkin, NP       [1]  Current Meds  Medication Sig   aspirin  EC 81 MG tablet Take 1 tablet (81 mg total) by mouth  daily. Swallow whole.   methocarbamol  (ROBAXIN ) 500 MG tablet Take 1 tablet (500 mg total) by mouth every 8 (eight) hours as needed.   nitroGLYCERIN  (NITROSTAT ) 0.4 MG SL tablet Place 0.4 mg under the tongue every 5 (five) minutes as needed.   ticagrelor  (BRILINTA ) 90 MG TABS tablet Take 1 tablet (90 mg total) by mouth 2 (two) times daily.   VENTOLIN HFA 108 (90 Base) MCG/ACT inhaler Take 1-2 puffs by mouth daily. Inhale 1-2 puffs by mouth every 4 to 6 hours as needed for shortness of breath or wheezing   [DISCONTINUED] atorvastatin  (LIPITOR) 80 MG tablet Take 80 mg by mouth daily.

## 2024-07-27 ENCOUNTER — Encounter (HOSPITAL_COMMUNITY)

## 2024-07-29 ENCOUNTER — Ambulatory Visit: Attending: Cardiovascular Disease | Admitting: Cardiology

## 2024-07-29 ENCOUNTER — Encounter (HOSPITAL_COMMUNITY)

## 2024-07-29 ENCOUNTER — Encounter: Payer: Self-pay | Admitting: Cardiology

## 2024-07-29 VITALS — BP 108/66 | HR 77 | Ht 64.0 in | Wt 114.0 lb

## 2024-07-29 DIAGNOSIS — E7800 Pure hypercholesterolemia, unspecified: Secondary | ICD-10-CM

## 2024-07-29 DIAGNOSIS — I2510 Atherosclerotic heart disease of native coronary artery without angina pectoris: Secondary | ICD-10-CM

## 2024-07-29 MED ORDER — ATORVASTATIN CALCIUM 80 MG PO TABS: 80.0000 mg | ORAL_TABLET | Freq: Every day | ORAL | 3 refills | Status: AC

## 2024-07-29 NOTE — Patient Instructions (Signed)
 Medication Instructions:  Your physician recommends that you continue on your current medications as directed. Please refer to the Current Medication list given to you today.  *If you need a refill on your cardiac medications before your next appointment, please call your pharmacy*  Lab Work: Fasting Lipid Panel, LFTs If you have labs (blood work) drawn today and your tests are completely normal, you will receive your results only by: MyChart Message (if you have MyChart) OR A paper copy in the mail If you have any lab test that is abnormal or we need to change your treatment, we will call you to review the results.  Testing/Procedures: None   Follow-Up: At [facility], you and your health needs are our priority.  As part of our continuing mission to provide you with exceptional heart care, our providers are all part of one team.  This team includes your primary Cardiologist (physician) and Advanced Practice Providers or APPs (Physician Assistants and Nurse Practitioners) who all work together to provide you with the care you need, when you need it.  Your next appointment:   4 - 5  month(s)  Provider:   Shelda Bruckner, MD    We recommend signing up for the patient portal called MyChart.  Sign up information is provided on this After Visit Summary.  MyChart is used to connect with patients for Virtual Visits (Telemedicine).  Patients are able to view lab/test results, encounter notes, upcoming appointments, etc.  Non-urgent messages can be sent to your provider as well.   To learn more about what you can do with MyChart, go to [email or web address].   Other Instructions None

## 2024-07-31 ENCOUNTER — Encounter (HOSPITAL_COMMUNITY)

## 2024-07-31 NOTE — Telephone Encounter (Signed)
"      °  06/08/2024 11:37 AM EDT by Zina Richerd DASEN, RN  Gwenetta Blush, Darice Jacob (Self) -- Remove  Left Message - Missed rehab session. Left VM.   "

## 2024-08-03 ENCOUNTER — Encounter (HOSPITAL_COMMUNITY)

## 2024-08-05 ENCOUNTER — Encounter (HOSPITAL_COMMUNITY)
